# Patient Record
Sex: Female | Born: 1935 | Race: White | Hispanic: No | Marital: Married | Smoking: Never smoker
Health system: Southern US, Community
[De-identification: ages and names within clinical notes are randomized; demographics above are authoritative.]

## PROBLEM LIST (undated history)

## (undated) DIAGNOSIS — I10 Essential (primary) hypertension: Secondary | ICD-10-CM

## (undated) DIAGNOSIS — K219 Gastro-esophageal reflux disease without esophagitis: Secondary | ICD-10-CM

## (undated) DIAGNOSIS — M1990 Unspecified osteoarthritis, unspecified site: Secondary | ICD-10-CM

## (undated) DIAGNOSIS — Z9889 Other specified postprocedural states: Secondary | ICD-10-CM

## (undated) DIAGNOSIS — I719 Aortic aneurysm of unspecified site, without rupture: Secondary | ICD-10-CM

## (undated) DIAGNOSIS — R002 Palpitations: Secondary | ICD-10-CM

## (undated) DIAGNOSIS — R112 Nausea with vomiting, unspecified: Secondary | ICD-10-CM

## (undated) DIAGNOSIS — J189 Pneumonia, unspecified organism: Secondary | ICD-10-CM

## (undated) DIAGNOSIS — R55 Syncope and collapse: Secondary | ICD-10-CM

## (undated) DIAGNOSIS — E785 Hyperlipidemia, unspecified: Secondary | ICD-10-CM

## (undated) DIAGNOSIS — R42 Dizziness and giddiness: Secondary | ICD-10-CM

## (undated) DIAGNOSIS — Z8489 Family history of other specified conditions: Secondary | ICD-10-CM

## (undated) DIAGNOSIS — T7840XA Allergy, unspecified, initial encounter: Secondary | ICD-10-CM

## (undated) DIAGNOSIS — D126 Benign neoplasm of colon, unspecified: Secondary | ICD-10-CM

## (undated) DIAGNOSIS — Z9289 Personal history of other medical treatment: Secondary | ICD-10-CM

## (undated) HISTORY — DX: Gastro-esophageal reflux disease without esophagitis: K21.9

## (undated) HISTORY — PX: CERVICAL CONE BIOPSY: SUR198

## (undated) HISTORY — PX: PANENDOSCOPY: SHX2159

## (undated) HISTORY — DX: Syncope and collapse: R55

## (undated) HISTORY — DX: Allergy, unspecified, initial encounter: T78.40XA

## (undated) HISTORY — DX: Hyperlipidemia, unspecified: E78.5

## (undated) HISTORY — DX: Essential (primary) hypertension: I10

## (undated) HISTORY — PX: COLONOSCOPY: SHX174

## (undated) HISTORY — PX: PAROTIDECTOMY: SUR1003

## (undated) HISTORY — DX: Palpitations: R00.2

## (undated) HISTORY — DX: Benign neoplasm of colon, unspecified: D12.6

## (undated) HISTORY — DX: Aortic aneurysm of unspecified site, without rupture: I71.9

---
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
## 1970-11-02 HISTORY — PX: TUBAL LIGATION: SHX77

## 1998-12-20 ENCOUNTER — Other Ambulatory Visit: Admission: RE | Admit: 1998-12-20 | Discharge: 1998-12-20 | Payer: Self-pay | Admitting: Family Medicine

## 2005-12-30 ENCOUNTER — Encounter: Admission: RE | Admit: 2005-12-30 | Discharge: 2005-12-30 | Payer: Self-pay | Admitting: Family Medicine

## 2008-01-09 HISTORY — PX: CARDIOVASCULAR STRESS TEST: SHX262

## 2009-06-02 DIAGNOSIS — J189 Pneumonia, unspecified organism: Secondary | ICD-10-CM

## 2009-06-02 HISTORY — DX: Pneumonia, unspecified organism: J18.9

## 2009-06-25 ENCOUNTER — Ambulatory Visit: Payer: Self-pay | Admitting: Diagnostic Radiology

## 2009-06-25 ENCOUNTER — Encounter: Payer: Self-pay | Admitting: Emergency Medicine

## 2009-06-26 ENCOUNTER — Inpatient Hospital Stay (HOSPITAL_COMMUNITY): Admission: EM | Admit: 2009-06-26 | Discharge: 2009-06-28 | Payer: Self-pay | Admitting: Internal Medicine

## 2009-12-05 ENCOUNTER — Encounter: Admission: RE | Admit: 2009-12-05 | Discharge: 2009-12-05 | Payer: Self-pay | Admitting: Gastroenterology

## 2009-12-17 ENCOUNTER — Encounter: Admission: RE | Admit: 2009-12-17 | Discharge: 2009-12-17 | Payer: Self-pay | Admitting: Gastroenterology

## 2009-12-30 ENCOUNTER — Encounter (INDEPENDENT_AMBULATORY_CARE_PROVIDER_SITE_OTHER): Payer: Self-pay | Admitting: *Deleted

## 2010-02-05 ENCOUNTER — Ambulatory Visit: Payer: Self-pay | Admitting: Gastroenterology

## 2010-02-05 ENCOUNTER — Telehealth (INDEPENDENT_AMBULATORY_CARE_PROVIDER_SITE_OTHER): Payer: Self-pay | Admitting: *Deleted

## 2010-03-02 DIAGNOSIS — D126 Benign neoplasm of colon, unspecified: Secondary | ICD-10-CM

## 2010-03-02 HISTORY — DX: Benign neoplasm of colon, unspecified: D12.6

## 2010-03-14 ENCOUNTER — Encounter (INDEPENDENT_AMBULATORY_CARE_PROVIDER_SITE_OTHER): Payer: Self-pay

## 2010-03-18 ENCOUNTER — Telehealth: Payer: Self-pay | Admitting: Gastroenterology

## 2010-03-18 ENCOUNTER — Ambulatory Visit: Payer: Self-pay | Admitting: Gastroenterology

## 2010-03-25 ENCOUNTER — Ambulatory Visit: Payer: Self-pay | Admitting: Gastroenterology

## 2010-03-27 ENCOUNTER — Encounter: Payer: Self-pay | Admitting: Gastroenterology

## 2010-05-09 ENCOUNTER — Telehealth: Payer: Self-pay | Admitting: Gastroenterology

## 2010-12-02 NOTE — Letter (Signed)
Summary: [facility] Instructions  [facility]  [address] [place], [place] [address]   Phone: [phone]  Fax: [phone]       Karla Chavez    11-Sep-1935    MRN: [id number]        Procedure Day /Date:  Tuesday 03/25/2010     Arrival Time: 8:30 am      Procedure Time: 9:30 am     Location of Procedure:                    _x _  [facility] (4th Floor)                        PREPARATION FOR COLONOSCOPY/ ENDO  WITH MOVIPREP   Starting 5 days prior to your procedure Thursday 5/19 do not eat nuts, seeds, popcorn, corn, beans, peas,  salads, or any raw vegetables.  Do not take any fiber supplements (e.g. Metamucil, Citrucel, and Benefiber).  THE DAY BEFORE YOUR PROCEDURE         DATE: Monday 5/23  1.  Drink clear liquids the entire day-NO SOLID FOOD  2.  Do not drink anything colored red or purple.  Avoid juices with pulp.  No orange juice.  3.  Drink at least 64 oz. (8 glasses) of fluid/clear liquids during the day to prevent dehydration and help the prep work efficiently.  CLEAR LIQUIDS INCLUDE: Water Jello Ice Popsicles Tea (sugar ok, no milk/cream) Powdered fruit flavored drinks Coffee (sugar ok, no milk/cream) Gatorade Juice: apple, white grape, white cranberry  Lemonade Clear bullion, consomm, broth Carbonated beverages (any kind) Strained chicken noodle soup Hard Candy                             4.  In the morning, mix first dose of MoviPrep solution:    Empty 1 Pouch A and 1 Pouch B into the disposable container    Add lukewarm drinking water to the top line of the container. Mix to dissolve    Refrigerate (mixed solution should be used within 24 hrs)  5.  Begin drinking the prep at 5:00 p.m. The MoviPrep container is divided by 4 marks.   Every 15 minutes drink the solution down to the next mark (approximately 8 oz) until the full liter is complete.   6.  Follow completed prep with 16 oz of clear liquid of your choice  (Nothing red or purple).  Continue to drink clear liquids until bedtime.  7.  Before going to bed, mix second dose of MoviPrep solution:    Empty 1 Pouch A and 1 Pouch B into the disposable container    Add lukewarm drinking water to the top line of the container. Mix to dissolve    Refrigerate  THE DAY OF YOUR PROCEDURE      DATE: Tuesday 5/24  Beginning at 4:30 a.m. (5 hours before procedure):         1. Every 15 minutes, drink the solution down to the next mark (approx 8 oz) until the full liter is complete.  2. Follow completed prep with 16 oz. of clear liquid of your choice.    3. You may drink clear liquids until 7:30 am  (2 HOURS BEFORE PROCEDURE).   MEDICATION INSTRUCTIONS  Unless otherwise instructed, you should take regular prescription medications with a small sip of water   as early as possible  the morning of your procedure.         OTHER INSTRUCTIONS  You will need a responsible adult at least 74 years of age to accompany you and drive you home.   This person must remain in the waiting room during your procedure.  Wear loose fitting clothing that is easily removed.  Leave jewelry and other valuables at home.  However, you may wish to bring a book to read or  an iPod/MP3 player to listen to music as you wait for your procedure to start.  Remove all body piercing jewelry and leave at home.  Total time from sign-in until discharge is approximately 2-3 hours.  You should go home directly after your procedure and rest.  You can resume normal activities the  day after your procedure.  The day of your procedure you should not:   Drive   Make legal decisions   Operate machinery   Drink alcohol   Return to work  You will receive specific instructions about eating, activities and medications before you leave.    The above instructions have been reviewed and explained to me by   Ulis Rias RN  Mar 18, 2010 10:57 AM     I fully understand and can  verbalize these instructions _____________________________ Date _________

## 2010-12-02 NOTE — Progress Notes (Signed)
Summary: pt changed from colon to colon/egd  ---- Converted from flag ---- ---- 02/05/2010 3:23 PM, Meryl Dare MD [facility] wrote: She can schedule her colon recall with me and then she can see me for heartburn in the office. However, if she has not had an EGD within the last few years by Dr. Matthias Hughs she can schedule a colon/egd directly with me if she would like. MS  ---- 02/05/2010 2:20 PM, Ezra Sites RN wrote: Dr. Russella Dar, I saw Adelina Mings in [facility] today for recall colon.  Last colon was with you in 2002.  She is also currently being seen by Dr. Matthias Hughs at [facility] for heartburn symptoms.  She did not make the connection that she was seeing 2 gastroenterologists.  She prefers to see you instead of Dr. Matthias Hughs. How do you want me to handle this? ------------------------------  Appended Document: pt changed from colon to colon/egd Pt scheduled for colon/egd 5/24 with Dr. Russella Dar.  PV scheduled for 5/10

## 2010-12-02 NOTE — Progress Notes (Signed)
Summary: Medication  Phone Note Call from Patient Call back at Home Phone [phone]   Caller: Patient Call For: Dr. Russella Dar Reason for Call: Talk to Nurse Summary of Call: The rx Dexilant is too expensive...Marland Kitchenneeds an alternative med. Initial call taken by: Karna Christmas,  May 09, 2010 11:38 AM  Follow-up for Phone Call        left message for pt  to call back with husband Follow-up by: Christie Nottingham CMA Duncan Dull),  May 09, 2010 1:42 PM  Additional Follow-up for Phone Call Additional follow up Details #1::        Pt states her medication is too expensive and would like to take omeprazole instead of Dexilant b/c her insurance will cover this at 100% withtout any problems. Rx was sent to pts pharmacy.  Additional Follow-up by: Christie Nottingham CMA Duncan Dull),  May 14, 2010 3:42 PM    New/Updated Medications: OMEPRAZOLE 40 MG CPDR (OMEPRAZOLE) one tablet by mouth once daily Prescriptions: OMEPRAZOLE 40 MG CPDR (OMEPRAZOLE) one tablet by mouth once daily  #30 x 11   Entered by:   Christie Nottingham CMA (AAMA)   Authorized by:   Meryl Dare MD [facility]   Signed by:   Christie Nottingham CMA (AAMA) on 05/14/2010   Method used:   [facility]  [place] [address] [facility]       [address]       [place], [place]  [address]       Ph: [phone] or [phone]       Fax: [phone]   RxID:   [id number]

## 2010-12-02 NOTE — Progress Notes (Signed)
Summary: Prep Medication  Phone Note Call from Patient Call back at Work Phone [phone]   Caller: Patient Call For: Dr. Russella Dar Reason for Call: Talk to Nurse Summary of Call: Moviprep is too expensive...Marland Kitchenneeds an alternative Initial call taken by: Leanor Kail [facility],  Mar 18, 2010 1:17 PM  Follow-up for Phone Call        Rebate coupon for Movi Prep mailed to pt. Follow-up by: Wyona Almas RN,  Mar 18, 2010 2:07 PM

## 2010-12-02 NOTE — Procedures (Signed)
Summary: Upper Endoscopy  Patient: [name] Note: All result statuses are Final unless otherwise noted.  Tests: (1) Upper Endoscopy (EGD)   EGD Upper Endoscopy       DONE     [facility]     [address]     [place], [place]  [address]           ENDOSCOPY PROCEDURE REPORT           PATIENT:  Karla, Chavez  MR#:  [id number]     BIRTHDATE:  May 31, 1935, 74 yrs. old  GENDER:  female     ENDOSCOPIST:  Judie Petit T. Russella Dar, MD, [facility]           PROCEDURE DATE:  03/25/2010     PROCEDURE:  EGD, diagnostic     ASA CLASS:  Class II     INDICATIONS:  GERD     MEDICATIONS:  There was residual sedation effect present from     prior procedure.     TOPICAL ANESTHETIC:  Exactacain Spray     DESCRIPTION OF PROCEDURE:   After the risks benefits and     alternatives of the procedure were thoroughly explained, informed     consent was obtained.  The LB GIF-H180 [id number] endoscope was     introduced through the mouth and advanced to the second portion of     the duodenum, without limitations.  The instrument was slowly     withdrawn as the mucosa was fully examined.     <<PROCEDUREIMAGES>>     The esophagus and gastroesophageal junction were completely normal     in appearance.  The stomach was entered and closely examined. The     pylorus, antrum, angularis, and lesser curvature were well     visualized, including a retroflexed view of the cardia and fundus.     The stomach wall was normally distensable. The scope passed easily     through the pylorus into the duodenum. The duodenal bulb was     normal in appearance, as was the postbulbar duodenum.  Retroflexed     views revealed no abnormalities.  The scope was then withdrawn     from the patient and the procedure completed.     COMPLICATIONS:  None           ENDOSCOPIC IMPRESSION:     1) Normal EGD           RECOMMENDATIONS:     1) Anti-reflux regimen     2) PPI qam: Dexilant 60 mg po qam, #30, 11 refills     3) DC  omeprazole           Tashona Calk T. Russella Dar, MD, [facility]           CC:  Kathee Delton, PA           n.     Rosalie DoctorVenita Lick. Jonella Redditt at 03/25/2010 10:17 AM           Adelina Mings, [id number]  Note: An exclamation mark (!) indicates a result that was not dispersed into the flowsheet. Document Creation Date: 03/25/2010 10:18 AM _______________________________________________________________________  (1) Order result status: Final Collection or observation date-time: 03/25/2010 10:12 Requested date-time:  Receipt date-time:  Reported date-time:  Referring Physician:   Ordering Physician: Claudette Head [id number]) Specimen Source:  Source: Launa Grill Order Number: [id number] Lab site:

## 2010-12-02 NOTE — Procedures (Signed)
Summary: Colonoscopy  Patient: Karla Chavez Note: All result statuses are Final unless otherwise noted.  Tests: (1) Colonoscopy (COL)   COL Colonoscopy           DONE     [facility]     [address]     [place], [place]  [address]           COLONOSCOPY PROCEDURE REPORT           PATIENT:  Karla Chavez, Karla Chavez  MR#:  [id number]     BIRTHDATE:  Dec 08, 1934, 74 yrs. old  GENDER:  female     ENDOSCOPIST:  Judie Petit T. Russella Dar, MD, [facility]           PROCEDURE DATE:  03/25/2010     PROCEDURE:  Colonoscopy with biopsy     ASA CLASS:  Class II     INDICATIONS:  1) Routine Risk Screening     MEDICATIONS:   Fentanyl 100 mcg IV, Versed 10 mg IV     DESCRIPTION OF PROCEDURE:   After the risks benefits and     alternatives of the procedure were thoroughly explained, informed     consent was obtained.  Digital rectal exam was performed and     revealed no abnormalities.   The LB PCF-H180AL [id number] endoscope     was introduced through the anus and advanced to the cecum, which     was identified by both the appendix and ileocecal valve, without     limitations.  The quality of the prep was excellent, using     MoviPrep.  The instrument was then slowly withdrawn as the colon     was fully examined.     <<PROCEDUREIMAGES>>     FINDINGS:  A sessile polyp was found in the cecum. It was 4 mm in     size. The polyp was removed using cold biopsy forceps.  A sessile     polyp was found at the hepatic flexure. It was 5 mm in size. The     polyp was removed using cold biopsy forceps.  Two polyps were     found in the sigmoid colon. They were 4 mm in size. The polyps     were removed using cold biopsy forceps.  A normal appearing     ileocecal valve, and appendiceal orifice were identified. The     ascending, hepatic flexure, transverse, splenic flexure,     descending, and rectum appeared unremarkable. Retroflexed views in     the rectum revealed internal hemorrhoids, small. The time to cecum     =   2  minutes. The scope was then withdrawn (time =  9.67  min)     from the patient and the procedure completed.     COMPLICATIONS:  None     ENDOSCOPIC IMPRESSION:     1) 4 mm sessile polyp in the cecum     2) 5 mm sessile polyp at the hepatic flexure     3) 4 mm Two polyps in the sigmoid colon     4) Internal hemorrhoids     RECOMMENDATIONS:     1) Await pathology results     2) If the polyps removed today are adenomatous (pre-cancerous),     you will need a repeat colonoscopy in 5 years. Otherwise you     should continue to follow colorectal cancer screening guidelines     for "routine risk" patients with colonoscopy in 10 years.  Venita Lick. Russella Dar, MD, [facility]           CC: Kathee Delton, PA           n.     Rosalie DoctorVenita Lick. Kristl Morioka at 03/25/2010 10:09 AM           Adelina Mings, [id number]  Note: An exclamation mark (!) indicates a result that was not dispersed into the flowsheet. Document Creation Date: 03/25/2010 10:10 AM _______________________________________________________________________  (1) Order result status: Final Collection or observation date-time: 03/25/2010 10:03 Requested date-time:  Receipt date-time:  Reported date-time:  Referring Physician:   Ordering Physician: Claudette Head [phone]) Specimen Source:  Source: Launa Grill Order Number: [id number] Lab site:   Appended Document: Colonoscopy     Procedures Next Due Date:    Colonoscopy: 03/2015

## 2010-12-02 NOTE — Miscellaneous (Signed)
Summary: Lec previsit  Clinical Lists Changes  Medications: Added new medication of MOVIPREP 100 GM  SOLR (PEG-KCL-NACL-NASULF-NA ASC-C) As per prep instructions. - Signed Rx of MOVIPREP 100 GM  SOLR (PEG-KCL-NACL-NASULF-NA ASC-C) As per prep instructions.;  #1 x 0;  Signed;  Entered by: Ulis Rias RN;  Authorized by: Meryl Dare MD [facility];  Method used: [facility]  [place] [address] [place], [place], [place]  [address], Ph: [phone] or [phone], Fax: [phone] Observations: Added new observation of NKA: T (03/18/2010 9:53)    Prescriptions: MOVIPREP 100 GM  SOLR (PEG-KCL-NACL-NASULF-NA ASC-C) As per prep instructions.  #1 x 0   Entered by:   Ulis Rias RN   Authorized by:   Meryl Dare MD [facility]   Signed by:   Ulis Rias RN on 03/18/2010   Method used:   [facility]        [facility]  [place] [address] [facility]ail[facility]       [address]       [place], [place]  [address]       Ph: [phone] or [phone]       Fax: [phone]   RxID:   [id number]

## 2010-12-02 NOTE — Miscellaneous (Signed)
Summary: dexilant order  Clinical Lists Changes  Medications: Added new medication of DEXILANT 60 MG CPDR (DEXLANSOPRAZOLE) one p.o. every am - Signed Rx of DEXILANT 60 MG CPDR (DEXLANSOPRAZOLE) one p.o. every am;  #30 x 11;  Signed;  Entered by: Alease Frame RN;  Authorized by: Meryl Dare MD [facility];  Method used: [facility]  [place] [address] [place], [place], [place]  [address], Ph: [phone] or [phone], Fax: [phone] Observations: Added new observation of ALLERGY REV: Done (03/25/2010 10:26) Added new observation of NKA: T (03/25/2010 10:26)    Prescriptions: DEXILANT 60 MG CPDR (DEXLANSOPRAZOLE) one p.o. every am  #30 x 11   Entered by:   Alease Frame RN   Authorized by:   Meryl Dare MD [facility]   Signed by:   Alease Frame RN on 03/25/2010   Method used:   [facility]        [facility]  [place] [address] (retail)       [address]       [place], [place]  [address]       Ph: [phone] or [phone]       Fax: [phone]   RxID:   [id number]

## 2010-12-02 NOTE — Letter (Signed)
Summary: Patient Notice- Polyp Results  [facility]  [address] [place], [place] [address]   Phone: [phone]  Fax: [phone]        Mar 27, 2010 MRN: [id number]    Karla Chavez [address] [place], [place]  [address]    Dear Ms. Mira,  I am pleased to inform you that the colon polyp(s) removed during your recent colonoscopy was (were) found to be benign (no cancer detected) upon pathologic examination.  I recommend you have a repeat colonoscopy examination in 5 years to look for recurrent polyps, as having colon polyps increases your risk for having recurrent polyps or even colon cancer in the future.  Should you develop new or worsening symptoms of abdominal pain, bowel habit changes or bleeding from the rectum or bowels, please schedule an evaluation with either your primary care physician or with me.  Continue treatment plan as outlined the day of your exam.  Please call us if you are having persistent problems or have questions about your condition that have not been fully answered at this time.  Sincerely,  Meryl Dare MD [facility]  This letter has been electronically signed by your physician.  Appended Document: Patient Notice- Polyp Results letter mailed.

## 2010-12-02 NOTE — Letter (Signed)
Summary: Previsit letter  [facility]  [address] [place], [place] [address]   Phone: [phone]  Fax: [phone]       12/30/2009 MRN: [id number]  Karla Chavez [address] [place], [place]  [address]  Dear Ms. Geeting,  Welcome to the Gastroenterology Division at [facility].    You are scheduled to see a nurse for your pre-procedure visit on 02-05-10 at 1:30pm on the 3rd floor at [facility], [address]  We ask that you try to arrive at our office 15 minutes prior to your appointment time to allow for check-in.  Your nurse visit will consist of discussing your medical and surgical history, your immediate family medical history, and your medications.    Please bring a complete list of all your medications or, if you prefer, bring the medication bottles and we will list them.  We will need to be aware of both prescribed and over the counter drugs.  We will need to know exact dosage information as well.  If you are on blood thinners (Coumadin, Plavix, Aggrenox, Ticlid, etc.) please call our office today/prior to your appointment, as we need to consult with your physician about holding your medication.   Please be prepared to read and sign documents such as consent forms, a financial agreement, and acknowledgement forms.  If necessary, and with your consent, a friend or relative is welcome to sit-in on the nurse visit with you.  Please bring your insurance card so that we may make a copy of it.  If your insurance requires a referral to see a specialist, please bring your referral form from your primary care physician.  No co-pay is required for this nurse visit.     If you cannot keep your appointment, please call [phone] to cancel or reschedule prior to your appointment date.  This allows [place] the opportunity to schedule an appointment for another patient in need of care.    Thank you for choosing [facility] for your medical  needs.  We appreciate the opportunity to care for you.  Please visit [place] at our website  to learn more about our practice.                     Sincerely.                                                                                                                   The Gastroenterology Division

## 2011-02-07 LAB — BASIC METABOLIC PANEL
BUN: 15 mg/dL (ref 6–23)
CO2: 25 mEq/L (ref 19–32)
Chloride: 102 mEq/L (ref 96–112)
GFR calc Af Amer: >60 mL/min (ref >60)
Glucose, Bld: 147 mg/dL — ABNORMAL HIGH (ref 70–99)
Sodium: 137 mEq/L (ref 135–145)

## 2011-02-07 LAB — URINALYSIS, ROUTINE W REFLEX MICROSCOPIC
Bilirubin Urine: NEGATIVE
Glucose, UA: NEGATIVE mg/dL
Glucose, UA: NEGATIVE mg/dL
Ketones, ur: NEGATIVE mg/dL
Leukocytes, UA: NEGATIVE
Nitrite: NEGATIVE
Nitrite: NEGATIVE
Protein, ur: NEGATIVE mg/dL
Specific Gravity, Urine: 1.005 (ref 1.005–1.030)
Specific Gravity, Urine: 1.018 (ref 1.005–1.030)
pH: 6 (ref 5.0–8.0)
pH: 7 (ref 5.0–8.0)

## 2011-02-07 LAB — CULTURE, BLOOD (ROUTINE X 2): Culture: NO GROWTH

## 2011-02-07 LAB — COMPREHENSIVE METABOLIC PANEL
ALT: 57 U/L — ABNORMAL HIGH (ref 0–35)
AST: 41 U/L — ABNORMAL HIGH (ref 0–37)
Albumin: 2.8 g/dL — ABNORMAL LOW (ref 3.5–5.2)
Alkaline Phosphatase: 123 U/L — ABNORMAL HIGH (ref 39–117)
CO2: 24 mEq/L (ref 19–32)
Calcium: 8.3 mg/dL — ABNORMAL LOW (ref 8.4–10.5)
Chloride: 106 mEq/L (ref 96–112)
Creatinine, Ser: 0.68 mg/dL (ref 0.4–1.2)
GFR calc Af Amer: >60 mL/min (ref >60)
Sodium: 138 mEq/L (ref 135–145)

## 2011-02-07 LAB — DIFFERENTIAL
Basophils Relative: 0 % (ref 0–1)
Eosinophils Absolute: 0.1 10*3/uL (ref 0.0–0.7)
Eosinophils Relative: 1 % (ref 0–5)
Lymphs Abs: 0.6 10*3/uL — ABNORMAL LOW (ref 0.7–4.0)
Monocytes Absolute: 0.5 10*3/uL (ref 0.1–1.0)
Monocytes Relative: 5 % (ref 3–12)
Monocytes Relative: 7 % (ref 3–12)
Neutro Abs: 5.9 10*3/uL (ref 1.7–7.7)
Neutro Abs: 6.9 10*3/uL (ref 1.7–7.7)
Neutrophils Relative %: 86 % — ABNORMAL HIGH (ref 43–77)

## 2011-02-07 LAB — CBC
HCT: 34.3 % — ABNORMAL LOW (ref 36.0–46.0)
HCT: 35.6 % — ABNORMAL LOW (ref 36.0–46.0)
MCHC: 34.8 g/dL (ref 30.0–36.0)
MCV: 92.3 fL (ref 78.0–100.0)
MCV: 93.1 fL (ref 78.0–100.0)
Platelets: 186 10*3/uL (ref 150–400)
Platelets: 204 10*3/uL (ref 150–400)
RBC: 3.69 MIL/uL — ABNORMAL LOW (ref 3.87–5.11)
RBC: 3.86 MIL/uL — ABNORMAL LOW (ref 3.87–5.11)
RDW: 12.6 % (ref 11.5–15.5)

## 2011-02-07 LAB — URINE MICROSCOPIC-ADD ON

## 2011-02-07 LAB — POCT CARDIAC MARKERS: Troponin i, poc: <0.05 ng/mL (ref 0.00–0.09)

## 2011-02-07 LAB — HEMOGLOBIN A1C: Mean Plasma Glucose: 120 mg/dL

## 2011-02-07 LAB — TSH: TSH: 2.79 u[IU]/mL (ref 0.350–4.500)

## 2011-03-17 NOTE — H&P (Signed)
NAMETYWANDA, RICE             ACCOUNT NO.:  [id number]   MEDICAL RECORD NO.:  [id number]          PATIENT TYPE:  INP   LOCATION:  5010                         FACILITY:  [facility]   PHYSICIAN:  Oswald Hillock, MD        DATE OF BIRTH:  19-Oct-1935   DATE OF ADMISSION:  06/26/2009  DATE OF DISCHARGE:                              HISTORY & PHYSICAL   CHIEF COMPLAINT:  Fever.   HISTORY OF PRESENT ILLNESS:  The patient is a 76 year old Caucasian  female with a known history of hypertension and hyperlipidemia in  addition to GERD who presented to the emergency room at [facility] with  3-5 day history of persistent fever.  Apparently, the patient was seen  by her primary care physician and started on amoxicillin, but had no  relief in her symptoms.  Significantly no diagnosis was established for  treatment with antibiotics at that time.   EMERGENCY ROOM EVALUATION:  The patient had an initial evaluation that  included chest x-ray, which showed a left lower lobe pneumonia.  She  also had an elevated D-dimer after which a CTA was done in the ER that  confirmed the presence of a pneumonia.   PAST MEDICAL HISTORY:  1. Hypertension.  2. Hyperlipidemia.  3. GERD.   PAST SURGICAL HISTORY:  History of hysterectomy/oophorectomy status post  complications after delivery.   CURRENT MEDICATIONS:  1. Amlodipine.  2. Amoxicillin.  3. Benazepril.  4. Omeprazole.  5. Simvastatin.   ALLERGIES:  No known drug allergies.   SOCIAL HISTORY:  No history of alcohol, tobacco, or drug use.  The  patient lives with her husband.   FAMILY HISTORY:  History of coronary artery disease in most of her  siblings.   REVIEW OF SYSTEMS:  An extensive review of systems was done.  All  systems are negative except for the positive mentioned in the history of  present illness.   PHYSICAL EXAMINATION:  VITALS:  On admission, pulse 100, blood pressure  105/71, respiratory rate of 24, temperature of 103.2, O2 sats  97%.  GENERAL:  Frail, elderly Caucasian female in no acute distress at the  time of this interview.  Alert and oriented to time, place, and person.  HEENT:  No scleral icterus.  No conjunctival congestion.  No pallor.  Ears negative.  No significant oropharyngeal lesions.  NECK:  Supple.  No lymphadenopathy.  No JVD.  No carotid bruit.  CHEST:  Breath sounds heard bilaterally.  Fair air entry.  Bronchial  breathing with crackles at the left base extending to lower one-third of  the chest.  CV:  S1 and S2 plus, regular.  No gallop or rub.  ABDOMEN:  Soft, nontender.  Bowel sounds present.  EXTREMITIES:  No cyanosis, clubbing, or edema.  NEUROLOGIC:  Cranial nerves II through XII are grossly intact.  No focal  motor or sensory deficits noted on gross examination.   LABORATORY DATA:  WBC count 7.9, hemoglobin 12.4, hematocrit 35.6,  platelet count of 204.  Urinalysis showed 0-2 wbc's, 7-10 rbc's, rare  bacteria.  Negative nitrite.  Negative leuk esterase.  Her chest x-ray  showed left lower lobe pneumonia.  Lactic acid level was 1.6.  D-dimer  was elevated at 2.04.  Strep screen was negative.  CK was 113, MB less  than 1, troponin less than 0.05.  Her CT showed no evidence of acute  pulmonary embolism and left lower lobe pneumonia.   EKG showed normal sinus rhythm, rate of 82.  No other significant  abnormalities noted.   IMPRESSION AND PLAN:  This is a case of a 76 year old Caucasian female  with history of hypertension, hyperlipidemia, gastroesophageal reflux  disease who presents with fever.  1. Left lower lobe pneumonia, need to rule out aspiration as the      patient does give a history of having some difficulty swallowing,      especially when she takes her medications.  She describes it as a      feeling of things getting stuck in the back of her throat.  We will      discontinue Rocephin and Zithromax, start her on Avelox, and      schedule her for swallow study in the  morning.  The patient will be      monitored closely.  2. Hypertension, controlled.  Continue current medications.  3. Gastroesophageal reflux disease.  We will continue omeprazole.  4. Hyperlipidemia.  Continue simvastatin.  Check fasting lipid panel.  5. DVT/GI prophylaxis.  Omeprazole and subcu heparin.      Oswald Hillock, MD  Electronically Signed     BA/MEDQ  D:  06/26/2009  T:  06/26/2009  Job:  [id number]   cc:   [facility]

## 2011-03-20 NOTE — Discharge Summary (Signed)
NAMEARMEDA, PLUMB             ACCOUNT NO.:  [id number]   MEDICAL RECORD NO.:  [id number]          PATIENT TYPE:  INP   LOCATION:  5010                         FACILITY:  [facility]   PHYSICIAN:  Hillery Aldo, M.D.   DATE OF BIRTH:  [birth year]   DATE OF ADMISSION:  06/26/2009  DATE OF DISCHARGE:  06/28/2009                               DISCHARGE SUMMARY   PRIMARY CARE PHYSICIAN:  [facility].   DISCHARGE DIAGNOSES:  1. Community-acquired pneumonia.  2. Hypertension.  3. Dyslipidemia.  4. Gastroesophageal reflux disease.  5. Transient presyncope.  6. Hypokalemia.   DISCHARGE MEDICATIONS:  1. Avelox 400 mg p.o. daily (note, this was changed secondary to cost      constraints).  2. Ceftin 500 mg p.o. daily x5 more days.  3. Amlodipine 5 mg p.o. daily.  4. Lotensin 10 mg p.o. daily.  5. Omeprazole 20 mg p.o. daily.  6. Simvastatin 80 mg p.o. q.p.m.   CONSULTATIONS:  None.   BRIEF ADMISSION HISTORY OF PRESENT ILLNESS:  The patient is a 76-year-  old female who presented to the hospital with a chief complaint of  persistent fever.  She was seen by her primary care physician as an  outpatient and initially put on amoxicillin, but had no relief of her  symptoms and subsequently presented to the emergency department for  further evaluation at which time a chest x-ray showed a new left lower  lobe pneumonia.  She subsequently was referred to the Hospitalist  Service for further evaluation and treatment.  For the full details,  please see the dictated report done by Dr. Ninfa Linden.   PROCEDURES AND DIAGNOSTIC STUDIES:  1. Chest x-ray on June 25, 2009, showed left lower lobe pneumonia.  2. CT angiogram of the chest on June 25, 2009, showed no evidence of      acute pulmonary embolism.  Left lower lobe pneumonia.   DISCHARGE LABORATORY DATA:  Blood cultures were negative.  Hemoglobin  A1c was 5.8%.  TSH was 2.790.  Sodium was 138, potassium 3.3 (repleted),  chloride 106, bicarb 24, BUN 6, creatinine 0.68, glucose 108, total  bilirubin 0.7, alkaline phosphatase 123, AST 41, ALT 57, total protein  5.9, albumin 2.8, calcium 8.3.  White blood cell count was 7.1,  hemoglobin 11.9, hematocrit 34.3, platelets 186.   HOSPITAL COURSE:  1. Pneumonia:  The patient was admitted and put on IV Avelox.  Over      the course of her hospital stay, the patient's fever resolved and      she felt better and by the date of discharge, felt much better and      was requesting discharge.  Because of the location of the      pneumonia, a speech therapy consult was requested to rule out      aspiration.  There was no evidence of aspiration.  She was      discharged initially on Avelox, but the pharmacy called requesting      an alternative antibiotic due to this being cost prohibitive and      was subsequently recommended that the  patient take an additional 5      days of therapy with Ceftin 500 mg p.o. b.i.d.  2. Hypertension:  The patient's blood pressure was stable throughout      her hospital stay.  3. Dyslipidemia:  The patient did have a fasting lipid panel checked      while in the hospital, which showed good LDL control at 98 and      total cholesterol of 144.  4. Gastroesophageal reflux disease:  Stable on a proton-pump      inhibitor.  5. Transient presyncope:  The patient complained of feeling hot and      heavy headed on the date of discharge.  She was monitored closely      for the next several hours.  She did not have any evidence of      orthostasis and ultimately was discharged when she felt better.   DISPOSITION:  The patient was discharged home.   CONDITION AT DISCHARGE:  Improved.   Time spent coordinating care for discharge and discharge instructions  equal 25 minutes.      Hillery Aldo, M.D.  Electronically Signed     CR/MEDQ  D:  07/01/2009  T:  07/02/2009  Job:  [id number]   cc:   [facility]

## 2011-03-20 NOTE — Discharge Summary (Signed)
NAMEJACQUELYN, SHADRICK             ACCOUNT NO.:  [id number]   MEDICAL RECORD NO.:  [id number]          PATIENT TYPE:  INP   LOCATION:  5010                         FACILITY:  [facility]   PHYSICIAN:  Melissa L. Ladona Ridgel, MD  DATE OF BIRTH:  [birth year]   DATE OF ADMISSION:  06/26/2009  DATE OF DISCHARGE:  06/28/2009                               DISCHARGE SUMMARY   DISCHARGING DIAGNOSES:  1. Left lower lobe pneumonia occult in nature.  The patient presented      to the hospital with low-grade fevers which had subsequently gotten      worse just prior to admission. She had been seen in the outpatient      office and was partially treated with oral Augmentin upon her      arrival.  She was admitted to the general medical floor and was      noted to be having difficulty with some swallowing mostly of      tablets.  The patient was initially treated with Rocephin and      azithromycin and subsequently was switched to Avelox, which will      more likely cover organisms in the community.  At the time of      discharge she would be discharged to home on Avelox 400 mg p.o.      daily.  The patient was afebrile and much improved at the time of      her discharge.  2. Hypertension.  The patient has been maintained on her current      medications which were Norvasc and Cipro.  Low blood pressure had      never been an issue for her during the hospital stay.  3. Gastroesophageal reflux disease.  The patient has had difficulty      with this as an outpatient and was started on Protonix as an      inpatient. However, she noted to me on admission that she had      actually failed outpatient therapy with Protonix and has done the      best with omeprazole delayed release. I, therefore, restarted that      for her brief hospital stay and will continue that at the time of      discharge.  4. Hyperlipidemia.  The patient was continued on her simvastatin.  5. Microhematuria, really not incredibly significant  at 0-2 WBCs and      myoglobin was obtained and was within normal limits.  If her      primary care physician feels that it is appropriate for a      urological consultation as an outpatient this may be in order.      However, at this time I do not have these findings were significant      enough to warrant that invasive a procedure.   MEDICATIONS AT THE TIME OF DISCHARGE:  Amlodipine 5 mg p.o. daily,  benazepril 10 mg daily. Heparin was used in the hospital but will be  discontinued at the time of discharge. Avelox 400 mg p.o. daily,  omeprazole 20 mg daily, and simvastatin  80 mg at bedtime.   Please note that the patient was treated with Xopenex during the course  of her hospital stay. However, this was p.r.n. and did not feel that at  the time of discharge she would require beta-agonist as she is looking  quite well.   CONSULTATIONS:  The patient was seen and evaluated by speech therapy and  was noted to have no obvious aspiration issues.   HOSPITAL COURSE:  The patient is a 76 year old white female who  presented to the emergency room after multiple interactions with her  primary care physician.  The patient evidently had a 3- to 5-day history  of persistent fever with cough.  She saw her primary care physician and  was started on amoxicillin but persisted in having a significant fever  and therefore was brought to the emergency room for further care. In the  emergency room the patient was evaluated with a chest x-ray which showed  left lower lobe pneumonia.  She also was noted to have an elevated D-  dimer and underwent CT scanning in the emergency room setting which  confirmed the presence of pneumonia.   The patient was admitted to the general medical floor, continued on IV  Avelox.  She underwent speech therapy evaluation and by 24 hours after  initiation of IV antibiotics was up, around, and afebrile.   PHYSICAL EXAMINATION:  GENERAL: The patient looked like a markedly   improved new person at that time. She had no complaints of cough, rigor,  chest pain, or fever.  She was sitting at the bedside eating her meal.  VITAL SIGNS:  On the day prior to discharge the patient remained  afebrile.  We converted her to oral antibiotic therapy without any  evidence for recurrence of her fever and on the day of discharge was  noted to have a temperature of 97.7, blood pressure 149/76, pulse 71,  respirations 18, saturation 98%.  The patient was seen and examined by my partner, whose exam was  consistent with my previous exam showing no rhonchi, rales, wheezes.  HEENT:  Pupils are equal, round, and reactive to light.  She has  anicteric sclerae.  CARDIOVASCULAR: Regular rhythm, positive S1, S2.  No S3, S4, no murmurs,  rubs or gallops.  ABDOMEN:  Soft, nontender, nondistended with positive bowel sounds.  There was no hepatosplenomegaly and no guarding or rebound.  EXTREMITIES:  No edema.  NEUROLOGIC: She was awake and alert, oriented.  Cranial nerves II-XII  are intact.  Power is 5/5.  DTRs are 2+.  Plantars are downgoing.    Pertinent laboratories during the course the admission reveal urine  which was negative for any infection.  We did, however, show trace blood  in the UA.  Please note that the patient did express concern that in the  past she has had trace blood and has had significant workup as a younger  woman, including cystoscopy.    Blood cultures have been negative x2. Hemoglobin A1c was 5.8.  TSH was  2.790.  Her total cholesterol was 144, triglycerides 82 cholesterol 30,  LDL was 98.  Her last sodium was 138, potassium 3.3, the chloride 106,  CO2 was 24, glucose of 108, BUN was 6, creatinine was 0.68. Her D-dimer  was slightly elevated at 2.04 and her lactic acid was 1.6.   At this time the patient is deemed stable for discharge to home on oral  Avelox to complete a full course of therapy and follow  up as an  outpatient with her primary care  physician.  Her condition at discharge  is stable.  Disposition is to home.  Diet is cardiac prudent, low  cholesterol diet.      Melissa L. Ladona Ridgel, MD  Electronically Signed     MLT/MEDQ  D:  07/01/2009  T:  07/01/2009  Job:  [id number]   cc:   Evalee Jefferson

## 2011-05-20 ENCOUNTER — Telehealth: Payer: Self-pay | Admitting: Cardiology

## 2011-05-20 NOTE — Telephone Encounter (Signed)
Patient is taking BP medication and has been feeling bad lately; elevated BP recently. She says that she's been very dizzy. Patient wants to speak w/RN. She has an appointment on 05/27/11 with [place]

## 2011-05-20 NOTE — Telephone Encounter (Signed)
Called wanting an app. Has been having some heart fluttering and "just not feeling good". Front office made her an app for 7/25.

## 2011-05-25 ENCOUNTER — Other Ambulatory Visit: Payer: Self-pay | Admitting: Cardiology

## 2011-05-25 NOTE — Telephone Encounter (Signed)
escribe medication per fax request  

## 2011-05-26 ENCOUNTER — Encounter: Payer: Self-pay | Admitting: Cardiology

## 2011-05-27 ENCOUNTER — Ambulatory Visit (INDEPENDENT_AMBULATORY_CARE_PROVIDER_SITE_OTHER): Payer: Medicare Other | Admitting: Cardiology

## 2011-05-27 ENCOUNTER — Encounter: Payer: Self-pay | Admitting: Cardiology

## 2011-05-27 VITALS — BP 152/96 | HR 77 | Ht 67.0 in | Wt 137.8 lb

## 2011-05-27 DIAGNOSIS — I10 Essential (primary) hypertension: Secondary | ICD-10-CM

## 2011-05-27 DIAGNOSIS — E785 Hyperlipidemia, unspecified: Secondary | ICD-10-CM

## 2011-05-27 DIAGNOSIS — E7800 Pure hypercholesterolemia, unspecified: Secondary | ICD-10-CM

## 2011-05-27 DIAGNOSIS — R55 Syncope and collapse: Secondary | ICD-10-CM

## 2011-05-27 DIAGNOSIS — R5381 Other malaise: Secondary | ICD-10-CM

## 2011-05-27 DIAGNOSIS — R5383 Other fatigue: Secondary | ICD-10-CM

## 2011-05-27 DIAGNOSIS — R002 Palpitations: Secondary | ICD-10-CM

## 2011-05-27 MED ORDER — METOPROLOL TARTRATE 25 MG PO TABS: 25.0000 mg | ORAL_TABLET | Freq: Two times a day (BID) | ORAL | Status: DC

## 2011-05-27 NOTE — Progress Notes (Signed)
   Karla Chavez Date of Birth: Apr 30, 1935   History of Present Illness: Karla Chavez is seen for yearly followup of hypertension. She states she has not been feeling well. She has been under a lot of stress recently. She has been experiencing symptoms of her heart fluttering and feeling like she might pass out. At other times she feels that she is "out of the world". She began experiencing these palpitations over the past 6 months but they have become more frequent over the past month. At one time her blood pressure increased to 200/115. She does find that her symptoms are helped with diazepam. She has had no true syncope. She denies any chest pain or shortness of breath. She feels that something is not normal.  Current Outpatient Prescriptions on File Prior to Visit  Medication Sig Dispense Refill  . amLODipine (NORVASC) 5 MG tablet Take 5 mg by mouth as needed.       . benazepril (LOTENSIN) 20 MG tablet TAKE 1 TABLET EVERY DAY  90 tablet  3  . temazepam (RESTORIL) 15 MG capsule Take 15 mg by mouth at bedtime.        Marland Kitchen zolpidem (AMBIEN) 5 MG tablet Take 5 mg by mouth at bedtime as needed.         Allergies  Allergen Reactions  . Amoxicillin     Past Medical History  Diagnosis Date  . Hypertension   . Hyperlipidemia   . Indigestion   . GERD (gastroesophageal reflux disease)   . Palpitations   . Near syncope     Past Surgical History  Procedure Date  . Tubal ligation   . Panendoscopy   . Cardiovascular stress test 01/09/2008    EF 82%    History  Smoking status  . Never Smoker   Smokeless tobacco  . Not on file    History  Alcohol Use No    Family History  Problem Relation Age of Onset  . Hypertension Mother   . Stroke Mother   . Heart disease Father     Review of Systems: As noted in history of present illness. She admits that she often forgets to take her amlodipine in the evening. All other systems were reviewed and are negative.  Physical Exam: BP  152/96  Pulse 77  Ht 5\' 7"  (1.702 m)  Wt 137 lb 12.8 oz (62.506 kg)  BMI 21.58 kg/m2 She is a pleasant white female who appears anxious. She is normocephalic, atraumatic. Pupils are round and reactive to light and accommodation. Extraocular movements are full. Oropharynx is clear. Neck is supple without JVD, adenopathy, thyromegaly, or bruits. Lungs are clear. Cardiac exam reveals a regular rate and rhythm without gallop, murmur, or click. Abdomen is soft and nontender. She has no masses or bruits. Pedal pulses are good. She has no edema. She is alert oriented x3. Cranial nerves II through XII are intact. Gait is normal. LABORATORY DATA: ECG is normal.  Assessment / Plan:

## 2011-05-27 NOTE — Assessment & Plan Note (Signed)
Blood pressure was significantly elevated today. We will continue with her but now Cipro and amlodipine as prescribed. We will add metoprolol 25 mg b.i.d. Continue sodium restriction.

## 2011-05-27 NOTE — Patient Instructions (Signed)
We will have you wear a monitor for the next month to see if we can document any arrhythmia.  We will obtain blood work and an Echocardiogram.  We will add metoprolol 25 mg twice a day to your current medications.  I will see you again in 1 month.

## 2011-05-27 NOTE — Assessment & Plan Note (Signed)
Her symptoms are concerning for primary arrhythmia. This is associated with near syncope. I've recommended an event monitor to try and document any arrhythmia. We will obtain baseline blood work including chemistries, lipid panel, TSH, and CBC. We will schedule her for an echocardiogram. I will start her on metoprolol 25 mg twice daily in addition to her other medications and see her back again in one month.

## 2011-05-28 ENCOUNTER — Encounter: Payer: Self-pay | Admitting: Cardiology

## 2011-05-31 ENCOUNTER — Other Ambulatory Visit: Payer: Self-pay | Admitting: Gastroenterology

## 2011-06-01 NOTE — Telephone Encounter (Signed)
NEEDS OFFICE VISIT FOR ANY FURTHER REFILLS! 

## 2011-06-10 ENCOUNTER — Encounter: Payer: Self-pay | Admitting: Cardiology

## 2011-06-10 ENCOUNTER — Other Ambulatory Visit: Payer: Self-pay | Admitting: Cardiology

## 2011-06-10 ENCOUNTER — Other Ambulatory Visit (INDEPENDENT_AMBULATORY_CARE_PROVIDER_SITE_OTHER): Payer: Medicare Other | Admitting: *Deleted

## 2011-06-10 ENCOUNTER — Ambulatory Visit (HOSPITAL_COMMUNITY): Payer: Medicare Other | Attending: Cardiology | Admitting: Radiology

## 2011-06-10 DIAGNOSIS — I10 Essential (primary) hypertension: Secondary | ICD-10-CM | POA: Insufficient documentation

## 2011-06-10 DIAGNOSIS — R5381 Other malaise: Secondary | ICD-10-CM | POA: Insufficient documentation

## 2011-06-10 DIAGNOSIS — I359 Nonrheumatic aortic valve disorder, unspecified: Secondary | ICD-10-CM | POA: Insufficient documentation

## 2011-06-10 DIAGNOSIS — E7800 Pure hypercholesterolemia, unspecified: Secondary | ICD-10-CM

## 2011-06-10 DIAGNOSIS — R5383 Other fatigue: Secondary | ICD-10-CM

## 2011-06-10 DIAGNOSIS — R55 Syncope and collapse: Secondary | ICD-10-CM | POA: Insufficient documentation

## 2011-06-10 DIAGNOSIS — E785 Hyperlipidemia, unspecified: Secondary | ICD-10-CM | POA: Insufficient documentation

## 2011-06-10 DIAGNOSIS — R002 Palpitations: Secondary | ICD-10-CM | POA: Insufficient documentation

## 2011-06-10 LAB — CBC WITH DIFFERENTIAL/PLATELET
Eosinophils Relative: 1 % (ref 0.0–5.0)
HCT: 42.7 % (ref 36.0–46.0)
Hemoglobin: 14.4 g/dL (ref 12.0–15.0)
Lymphs Abs: 1.3 10*3/uL (ref 0.7–4.0)
Monocytes Relative: 5.5 % (ref 3.0–12.0)
Neutro Abs: 2.3 10*3/uL (ref 1.4–7.7)
WBC: 3.9 10*3/uL — ABNORMAL LOW (ref 4.5–10.5)

## 2011-06-10 LAB — HEPATIC FUNCTION PANEL
ALT: 10 U/L (ref 0–35)
AST: 13 U/L (ref 0–37)
Alkaline Phosphatase: 58 U/L (ref 39–117)
Bilirubin, Direct: 0.1 mg/dL (ref 0.0–0.3)
Total Bilirubin: 0.9 mg/dL (ref 0.3–1.2)

## 2011-06-10 LAB — BASIC METABOLIC PANEL
Chloride: 107 mEq/L (ref 96–112)
Potassium: 4 mEq/L (ref 3.5–5.1)

## 2011-06-10 LAB — TSH: TSH: 1.4 u[IU]/mL (ref 0.35–5.50)

## 2011-06-10 LAB — LIPID PANEL: Total CHOL/HDL Ratio: 5

## 2011-06-11 ENCOUNTER — Telehealth: Payer: Self-pay | Admitting: *Deleted

## 2011-06-11 DIAGNOSIS — E7800 Pure hypercholesterolemia, unspecified: Secondary | ICD-10-CM

## 2011-06-11 MED ORDER — ZOLPIDEM TARTRATE 5 MG PO TABS: 5.0000 mg | ORAL_TABLET | Freq: Every evening | ORAL | Status: DC | PRN

## 2011-06-11 MED ORDER — ATORVASTATIN CALCIUM 80 MG PO TABS: 80.0000 mg | ORAL_TABLET | Freq: Every day | ORAL | Status: DC

## 2011-06-11 NOTE — Telephone Encounter (Signed)
Notified of lab results and Echo. States has not been taking Simvastatin 80 mg; may take 1/4 occ. But is afraid of taking it because of what she has heard on news. Per Dr. [place] will change her to gen Lipitor 80 mg because LDL was 224. Will send echo and labs to [facility]. Will recheck labs in 3 mo.

## 2011-06-11 NOTE — Telephone Encounter (Signed)
Message copied by Lorayne Bender on Thu Jun 11, 2011 10:43 AM ------      Message from: [place], PETER M      Created: Thu Jun 11, 2011  8:12 AM       Chemistries and TSH are normal. WBC is mildly decreased. Otherwise CBC is normal. Cholesterol is markedly elevated. Last level in 8/12 was much lower. Was she taking medication before. May need to consider statin Rx.

## 2011-07-01 ENCOUNTER — Telehealth: Payer: Self-pay | Admitting: Cardiology

## 2011-07-01 NOTE — Telephone Encounter (Signed)
FYI Only:  Patient cancelled appointment for 81m follow up.  Her husband fell and broke his hip, she did not want to reschedule at this time.

## 2011-07-01 NOTE — Telephone Encounter (Signed)
Noted  Maxie Better, RN

## 2011-07-03 ENCOUNTER — Ambulatory Visit: Payer: PRIVATE HEALTH INSURANCE | Admitting: Cardiology

## 2011-07-29 ENCOUNTER — Other Ambulatory Visit: Payer: Self-pay | Admitting: Gastroenterology

## 2011-08-06 ENCOUNTER — Other Ambulatory Visit: Payer: Self-pay | Admitting: *Deleted

## 2011-08-06 MED ORDER — AMLODIPINE BESYLATE 5 MG PO TABS: 5.0000 mg | ORAL_TABLET | Freq: Every day | ORAL | Status: DC

## 2011-08-12 ENCOUNTER — Other Ambulatory Visit: Payer: Self-pay | Admitting: Gastroenterology

## 2011-08-13 ENCOUNTER — Other Ambulatory Visit: Payer: Self-pay | Admitting: Gastroenterology

## 2011-08-14 NOTE — Telephone Encounter (Signed)
NEEDS OFFICE VISIT FOR ANY FURTHER REFILLS! 

## 2011-10-16 ENCOUNTER — Other Ambulatory Visit: Payer: Self-pay | Admitting: Gastroenterology

## 2012-07-01 ENCOUNTER — Other Ambulatory Visit: Payer: Self-pay

## 2012-07-01 MED ORDER — BENAZEPRIL HCL 20 MG PO TABS: 20.0000 mg | ORAL_TABLET | Freq: Every day | ORAL | Status: DC

## 2012-09-05 ENCOUNTER — Other Ambulatory Visit: Payer: Self-pay | Admitting: Cardiology

## 2012-09-05 MED ORDER — BENAZEPRIL HCL 20 MG PO TABS: 20.0000 mg | ORAL_TABLET | Freq: Every day | ORAL | Status: DC

## 2012-10-04 ENCOUNTER — Encounter: Payer: Self-pay | Admitting: Cardiology

## 2012-10-04 ENCOUNTER — Other Ambulatory Visit: Payer: Self-pay

## 2012-10-04 ENCOUNTER — Ambulatory Visit (INDEPENDENT_AMBULATORY_CARE_PROVIDER_SITE_OTHER): Payer: Medicare Other | Admitting: Cardiology

## 2012-10-04 VITALS — BP 162/88 | HR 67 | Ht 67.0 in | Wt 136.1 lb

## 2012-10-04 DIAGNOSIS — E785 Hyperlipidemia, unspecified: Secondary | ICD-10-CM

## 2012-10-04 DIAGNOSIS — I491 Atrial premature depolarization: Secondary | ICD-10-CM

## 2012-10-04 DIAGNOSIS — I10 Essential (primary) hypertension: Secondary | ICD-10-CM

## 2012-10-04 MED ORDER — AMLODIPINE BESYLATE 5 MG PO TABS: 5.0000 mg | ORAL_TABLET | Freq: Every day | ORAL | Status: DC

## 2012-10-04 MED ORDER — DIAZEPAM 5 MG PO TABS: 5.0000 mg | ORAL_TABLET | Freq: Four times a day (QID) | ORAL | Status: DC | PRN

## 2012-10-04 MED ORDER — ATORVASTATIN CALCIUM 80 MG PO TABS: 80.0000 mg | ORAL_TABLET | Freq: Every day | ORAL | Status: DC

## 2012-10-04 MED ORDER — BENAZEPRIL HCL 20 MG PO TABS: 20.0000 mg | ORAL_TABLET | Freq: Every day | ORAL | Status: DC

## 2012-10-04 MED ORDER — METOPROLOL TARTRATE 25 MG PO TABS: 25.0000 mg | ORAL_TABLET | Freq: Two times a day (BID) | ORAL | Status: DC

## 2012-10-04 MED ORDER — PANTOPRAZOLE SODIUM 40 MG PO TBEC: 40.0000 mg | DELAYED_RELEASE_TABLET | Freq: Every day | ORAL | Status: DC

## 2012-10-04 NOTE — Patient Instructions (Signed)
Take your medication regularly.  We will check fasting lab work in 2 months.  I will see you in 1 year

## 2012-10-04 NOTE — Progress Notes (Signed)
Karla Chavez Date of Birth: 09-24-1935   History of Present Illness: Karla Chavez is seen for yearly followup of hypertension. She really comes in today to get her medications renewed. He reports she has done well over the past year with the exception of severe indigestion. She is taking over-the-counter Prilosec for this. She complains of insomnia. She admits that she hasn't taken her medications regularly.  Current Outpatient Prescriptions on File Prior to Visit  Medication Sig Dispense Refill  . [DISCONTINUED] amLODipine (NORVASC) 5 MG tablet Take 1 tablet (5 mg total) by mouth daily.  90 tablet  3  . [DISCONTINUED] atorvastatin (LIPITOR) 80 MG tablet Take 1 tablet (80 mg total) by mouth daily.  30 tablet  5  . [DISCONTINUED] benazepril (LOTENSIN) 20 MG tablet Take 1 tablet (20 mg total) by mouth daily.  30 tablet  1  . [DISCONTINUED] metoprolol tartrate (LOPRESSOR) 25 MG tablet Take 1 tablet (25 mg total) by mouth 2 (two) times daily.  60 tablet  11  . pantoprazole (PROTONIX) 40 MG tablet Take 1 tablet (40 mg total) by mouth daily.  90 tablet  3    Allergies  Allergen Reactions  . Amoxicillin     Past Medical History  Diagnosis Date  . Hypertension   . Hyperlipidemia   . Indigestion   . GERD (gastroesophageal reflux disease)   . Palpitations   . Near syncope     Past Surgical History  Procedure Date  . Tubal ligation   . Panendoscopy   . Cardiovascular stress test 01/09/2008    EF 82%    History  Smoking status  . Never Smoker   Smokeless tobacco  . Not on file    History  Alcohol Use No    Family History  Problem Relation Age of Onset  . Hypertension Mother   . Stroke Mother   . Heart disease Father     Review of Systems: As noted in history of present illness. She complains of recent pain in her right shoulder. She had received a Medrol Dosepak for this without relief. She is taking ibuprofen daily. All other systems were reviewed and are  negative.  Physical Exam: BP 162/88  Pulse 67  Ht 5\' 7"  (1.702 m)  Wt 136 lb 1.9 oz (61.744 kg)  BMI 21.32 kg/m2 She is a pleasant white female in no acute distress. She is normocephalic, atraumatic. Pupils are round and reactive to light and accommodation. Extraocular movements are full. Oropharynx is clear. Neck is supple without JVD, adenopathy, thyromegaly, or bruits. Lungs are clear. Cardiac exam reveals a regular rate and rhythm without gallop, murmur, or click. Abdomen is soft and nontender. She has no masses or bruits. Pedal pulses are good. She has no edema. She is alert oriented x3. Cranial nerves II through XII are intact. Gait is normal.  LABORATORY DATA: ECG is normal.  Assessment / Plan: 1. Hypertension, poorly controlled. She has not been taking her beta blocker. I reviewed all her prescriptions today including amlodipine, benazepril, and metoprolol. I recommended that she take them as prescribed.  2. Hypercholesterolemia. She has severe familial hypercholesterolemia with prior LDL cholesterol 224. I recommended that she take her atorvastatin 80 mg daily. I would recommend checking fasting lab work in 2 months after she has been on medication.  3. Palpitations. She has a history of PACs and short burst of PACs on previous monitor. We will continue with metoprolol.  4. Insomnia. She has tried Restoril, Ambien, and  melatonin in the past without good results. She wants to try diazepam. I've given her a prescription for 5 mg #30 tablets with no refill. Any further refills we'll need to go to her primary care.

## 2012-12-12 ENCOUNTER — Other Ambulatory Visit: Payer: Self-pay

## 2012-12-13 ENCOUNTER — Other Ambulatory Visit: Payer: Medicare Other

## 2012-12-14 NOTE — Telephone Encounter (Signed)
Please refer to pt's PCP.

## 2013-01-13 ENCOUNTER — Other Ambulatory Visit: Payer: Medicare Other

## 2013-01-16 ENCOUNTER — Other Ambulatory Visit (INDEPENDENT_AMBULATORY_CARE_PROVIDER_SITE_OTHER): Payer: Medicare Other

## 2013-01-16 LAB — HEPATIC FUNCTION PANEL
AST: 13 U/L (ref 0–37)
Albumin: 4 g/dL (ref 3.5–5.2)
Alkaline Phosphatase: 76 U/L (ref 39–117)
Total Protein: 6.6 g/dL (ref 6.0–8.3)

## 2013-01-16 LAB — BASIC METABOLIC PANEL
CO2: 28 mEq/L (ref 19–32)
GFR: 65.58 mL/min (ref >60.00)
Glucose, Bld: 93 mg/dL (ref 70–99)
Potassium: 3.7 mEq/L (ref 3.5–5.1)
Sodium: 140 mEq/L (ref 135–145)

## 2013-06-09 ENCOUNTER — Encounter: Payer: Self-pay | Admitting: *Deleted

## 2013-09-19 ENCOUNTER — Other Ambulatory Visit: Payer: Self-pay | Admitting: *Deleted

## 2013-09-19 MED ORDER — PANTOPRAZOLE SODIUM 40 MG PO TBEC: 40.0000 mg | DELAYED_RELEASE_TABLET | Freq: Every day | ORAL | Status: DC

## 2013-11-02 HISTORY — PX: OTHER SURGICAL HISTORY: SHX169

## 2013-12-06 ENCOUNTER — Other Ambulatory Visit: Payer: Medicare Other

## 2013-12-06 ENCOUNTER — Ambulatory Visit: Payer: Medicare Other | Admitting: Cardiology

## 2013-12-21 ENCOUNTER — Other Ambulatory Visit: Payer: Medicare Other

## 2013-12-21 ENCOUNTER — Ambulatory Visit: Payer: Medicare Other | Admitting: Nurse Practitioner

## 2013-12-26 ENCOUNTER — Other Ambulatory Visit: Payer: Medicare Other

## 2013-12-26 ENCOUNTER — Ambulatory Visit: Payer: Medicare Other | Admitting: Physician Assistant

## 2014-01-03 ENCOUNTER — Ambulatory Visit (INDEPENDENT_AMBULATORY_CARE_PROVIDER_SITE_OTHER): Payer: Medicare Other | Admitting: Physician Assistant

## 2014-01-03 ENCOUNTER — Encounter: Payer: Self-pay | Admitting: Physician Assistant

## 2014-01-03 ENCOUNTER — Other Ambulatory Visit: Payer: Medicare Other

## 2014-01-03 VITALS — BP 142/82 | HR 68 | Ht 67.0 in | Wt 144.0 lb

## 2014-01-03 DIAGNOSIS — R002 Palpitations: Secondary | ICD-10-CM

## 2014-01-03 DIAGNOSIS — R220 Localized swelling, mass and lump, head: Secondary | ICD-10-CM

## 2014-01-03 DIAGNOSIS — R42 Dizziness and giddiness: Secondary | ICD-10-CM | POA: Insufficient documentation

## 2014-01-03 DIAGNOSIS — I10 Essential (primary) hypertension: Secondary | ICD-10-CM

## 2014-01-03 DIAGNOSIS — K118 Other diseases of salivary glands: Secondary | ICD-10-CM

## 2014-01-03 DIAGNOSIS — R221 Localized swelling, mass and lump, neck: Secondary | ICD-10-CM

## 2014-01-03 DIAGNOSIS — E785 Hyperlipidemia, unspecified: Secondary | ICD-10-CM

## 2014-01-03 LAB — LIPID PANEL
CHOLESTEROL: 162 mg/dL (ref 0–200)
HDL: 60.1 mg/dL (ref >39.00)
LDL CALC: 81 mg/dL (ref 0–99)
Total CHOL/HDL Ratio: 3
Triglycerides: 107 mg/dL (ref 0.0–149.0)
VLDL: 21.4 mg/dL (ref 0.0–40.0)

## 2014-01-03 LAB — HEPATIC FUNCTION PANEL
ALK PHOS: 72 U/L (ref 39–117)
ALT: 17 U/L (ref 0–35)
AST: 16 U/L (ref 0–37)
Albumin: 4 g/dL (ref 3.5–5.2)
BILIRUBIN DIRECT: 0.1 mg/dL (ref 0.0–0.3)
TOTAL PROTEIN: 6.6 g/dL (ref 6.0–8.3)
Total Bilirubin: 0.9 mg/dL (ref 0.3–1.2)

## 2014-01-03 MED ORDER — PANTOPRAZOLE SODIUM 40 MG PO TBEC: 40.0000 mg | DELAYED_RELEASE_TABLET | Freq: Every day | ORAL | Status: DC

## 2014-01-03 MED ORDER — BENAZEPRIL HCL 20 MG PO TABS: 20.0000 mg | ORAL_TABLET | Freq: Every day | ORAL | Status: DC

## 2014-01-03 MED ORDER — ATORVASTATIN CALCIUM 80 MG PO TABS: 80.0000 mg | ORAL_TABLET | Freq: Every day | ORAL | Status: DC

## 2014-01-03 MED ORDER — AMLODIPINE BESYLATE 5 MG PO TABS: 5.0000 mg | ORAL_TABLET | Freq: Every day | ORAL | Status: DC

## 2014-01-03 NOTE — Patient Instructions (Signed)
Your physician recommends that you have a FASTING lipid profile, LFT  You have been referred to ENT  Your physician recommends that you schedule a follow-up appointment in: 2 months with Dr. [place]   Your physician recommends that you continue on your current medications as directed. Please refer to the Current Medication list given to you today.

## 2014-01-03 NOTE — Assessment & Plan Note (Signed)
Patient was diagnosed with vertigo and placed on meclizine. This is making her mouth very dry and she is fatigued.

## 2014-01-03 NOTE — Assessment & Plan Note (Signed)
Patient was found to have a 2 cm left parotid mass on MRI. We will refer to local ENT.

## 2014-01-03 NOTE — Assessment & Plan Note (Signed)
Blood pressure seems to be gone up and down although patient does not take her medications as prescribed. She seems to take it based on what her blood pressure readings are home. Have asked her to take her metoprolol and Lotensin on a regular basis.

## 2014-01-03 NOTE — Assessment & Plan Note (Signed)
Palpitations are off-and-on and probably worse because she's not taking her metoprolol as prescribed.

## 2014-01-03 NOTE — Progress Notes (Signed)
HPI: Karla Chavez is a 79 year old female patient Dr. Martinique who was last seen in December 2013 for hypertension, palpitations, and hyperlipidemia.  The patient [facility] [place] for dizziness. She said she was watching TV and became quite dizzy and tried to lay down and the whole bed was spinning. She was diagnosed with vertigo and placed on meclizine and Valium. She says Karla Chavez makes her mouth very dry she becomes groggy. She had blood work including troponin which was all normal. She also had an MRI that showed no acute intra-or cranial findings but she did have a 2 cm left parotid mass. They scheduled her to followup with an ENT in [place] but she says she cannot go to [place]. She is asking for referral.  The patient has not felt well overall in a while. She is very nonspecific. She has occasional palpitations. She says she takes her blood pressure daily and takes her medicine only if her blood pressure is elevated. She says at times her diastolic blood pressures in the 50s and she usually doesn't take her amlodipine. She tries to take her metoprolol twice a day but she skips Karla Chavez as well as her blood pressure is on the low side. When she was in the emergency room her blood pressure was extremely high. She is under a lot of stress because her husband had hip surgery followed by gallbladder surgery and her son is having medical problems as well.  Allergies-- Amoxicillin   Current Outpatient Prescriptions on File Prior to Visit: diazepam (VALIUM) 5 MG tablet, Take 1 tablet (5 mg total) by mouth every 6 (six) hours as needed for anxiety., Disp: 30 tablet, Rfl: 0 metoprolol tartrate (LOPRESSOR) 25 MG tablet, Take 1 tablet (25 mg total) by mouth 2 (two) times daily., Disp: 180 tablet, Rfl: 3  No current facility-administered medications on file prior to visit.   Past Medical History:   Hypertension                                                Hyperlipidemia                                               Indigestion                                                  GERD (gastroesophageal reflux disease)                       Palpitations                                                 Near syncope  Past Surgical History:   TUBAL LIGATION                                                PANENDOSCOPY                                                  CARDIOVASCULAR STRESS TEST                       01/09/2008       Comment:EF 82%  Review of patient's family history indicates:   Hypertension                   Mother                   Stroke                         Mother                   Heart disease                  Father                   Social History   Marital Status: Married             Spouse Name:                      Years of Education:                 Number of children: 4           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 Neck: No JVD, HJR, Bruit, or thyroid enlargement  Lungs: No tachypnea, clear without wheezing, rales, or rhonchi  Cardiovascular: RRR, PMI not displaced, heart sounds normal, no murmurs, gallops,  bruit, thrill, or heave.  Abdomen: BS normal. Soft without organomegaly, masses, lesions or tenderness.  Extremities: without cyanosis, clubbing or edema. Good distal pulses bilateral  SKin: Warm, no lesions or rashes   Musculoskeletal: No  deformities  Neuro: no focal signs  BP 142/82  Pulse 68  Ht 5\' 7"  (1.702 m)  Wt 144 lb (65.318 kg)  BMI 22.55 kg/m2   EKG: Patient refused EKG because she had it done in the emergency room although we have no copy of Karla Chavez.

## 2014-01-04 ENCOUNTER — Other Ambulatory Visit: Payer: Self-pay | Admitting: Otolaryngology

## 2014-01-04 ENCOUNTER — Other Ambulatory Visit (HOSPITAL_COMMUNITY)
Admission: RE | Admit: 2014-01-04 | Discharge: 2014-01-04 | Disposition: A | Discharge status: Home / Self Care | Payer: Medicare Other | Source: Ambulatory Visit | Attending: Otolaryngology | Admitting: Otolaryngology

## 2014-01-04 DIAGNOSIS — R220 Localized swelling, mass and lump, head: Secondary | ICD-10-CM | POA: Insufficient documentation

## 2014-01-04 DIAGNOSIS — R221 Localized swelling, mass and lump, neck: Principal | ICD-10-CM

## 2014-01-18 ENCOUNTER — Other Ambulatory Visit: Payer: Self-pay

## 2014-01-18 MED ORDER — ATORVASTATIN CALCIUM 40 MG PO TABS: 40.0000 mg | ORAL_TABLET | Freq: Every day | ORAL | Status: DC

## 2014-01-22 ENCOUNTER — Other Ambulatory Visit (HOSPITAL_COMMUNITY): Payer: Self-pay | Admitting: Otolaryngology

## 2014-01-22 DIAGNOSIS — K118 Other diseases of salivary glands: Secondary | ICD-10-CM

## 2014-01-26 ENCOUNTER — Encounter (HOSPITAL_COMMUNITY): Payer: Self-pay | Admitting: Pharmacy Technician

## 2014-01-31 ENCOUNTER — Other Ambulatory Visit: Payer: Self-pay | Admitting: Radiology

## 2014-02-01 ENCOUNTER — Ambulatory Visit (HOSPITAL_COMMUNITY)
Admission: RE | Admit: 2014-02-01 | Discharge: 2014-02-01 | Disposition: A | Discharge status: Home / Self Care | Payer: Medicare Other | Source: Ambulatory Visit | Attending: Otolaryngology | Admitting: Otolaryngology

## 2014-02-01 DIAGNOSIS — K119 Disease of salivary gland, unspecified: Secondary | ICD-10-CM | POA: Insufficient documentation

## 2014-02-01 DIAGNOSIS — R220 Localized swelling, mass and lump, head: Secondary | ICD-10-CM | POA: Insufficient documentation

## 2014-02-01 DIAGNOSIS — K118 Other diseases of salivary glands: Secondary | ICD-10-CM

## 2014-02-01 DIAGNOSIS — R221 Localized swelling, mass and lump, neck: Secondary | ICD-10-CM

## 2014-02-01 LAB — CBC
HEMATOCRIT: 42.4 % (ref 36.0–46.0)
Hemoglobin: 14.6 g/dL (ref 12.0–15.0)
MCH: 32.2 pg (ref 26.0–34.0)
MCHC: 34.4 g/dL (ref 30.0–36.0)
MCV: 93.4 fL (ref 78.0–100.0)
PLATELETS: 197 10*3/uL (ref 150–400)
RBC: 4.54 MIL/uL (ref 3.87–5.11)
RDW: 12.6 % (ref 11.5–15.5)
WBC: 5.2 10*3/uL (ref 4.0–10.5)

## 2014-02-01 LAB — PROTIME-INR
INR: 0.84 (ref 0.00–1.49)
Prothrombin Time: 11.4 seconds — ABNORMAL LOW (ref 11.6–15.2)

## 2014-02-01 LAB — APTT: aPTT: 27 seconds (ref 24–37)

## 2014-02-01 MED ORDER — SODIUM CHLORIDE 0.9 % IV SOLN
INTRAVENOUS | Status: DC
  Administered 2014-02-01: 09:00:00 via INTRAVENOUS

## 2014-02-01 MED ORDER — FENTANYL CITRATE 0.05 MG/ML IJ SOLN
INTRAMUSCULAR | Status: AC
  Filled 2014-02-01: qty 2

## 2014-02-01 NOTE — Sedation Documentation (Signed)
Pt will not be sedated for procedure

## 2014-02-01 NOTE — Procedures (Signed)
Procedure:  Ultrasound guided left parotid aspiration Findings:  2.4 cm left parotid complex cyst.  1 mL of bloody, thick fluid aspirated from 20 G needle.  Sent for cytologic examination.

## 2014-02-02 ENCOUNTER — Other Ambulatory Visit: Payer: Self-pay

## 2014-02-02 MED ORDER — METOPROLOL TARTRATE 25 MG PO TABS: 25.0000 mg | ORAL_TABLET | Freq: Two times a day (BID) | ORAL | Status: DC

## 2014-02-15 ENCOUNTER — Encounter (HOSPITAL_COMMUNITY): Payer: Self-pay | Admitting: Pharmacy Technician

## 2014-02-19 NOTE — Pre-Procedure Instructions (Signed)
ODESTER NILSON  02/19/2014   Your procedure is scheduled on:  Thursday March 01, 2014 at 7:30 AM.  Report to [facility] Short Stay Entrance "A"  Admitting at 5:30 AM.  Call this number if you have problems the morning of surgery: [phone]   Remember:   Do not eat food or drink liquids after midnight.   Take these medicines the morning of surgery with A SIP OF WATER: Metoprolol (Lopressor), Pantoprazole (Protonix), and Benazepril    Do not wear jewelry, make-up or nail polish.  Do not wear lotions, powders, or perfumes.   Do not shave 48 hours prior to surgery.   Do not bring valuables to the hospital.  [facility] is not responsible for any belongings or valuables.               Contacts, dentures or bridgework may not be worn into surgery.  Leave suitcase in the car. After surgery it may be brought to your room.  For patients admitted to the hospital, discharge time is determined by your treatment team.               Patients discharged the day of surgery will not be allowed to drive home.  Name and phone number of your driver: Family/Friend  Special Instructions: Shower using CHG soap the night before and the morning of your surgery   Please read over the following fact sheets that you were given: Pain Booklet, Coughing and Deep Breathing and Surgical Site Infection Prevention

## 2014-02-20 ENCOUNTER — Encounter (HOSPITAL_COMMUNITY): Payer: Self-pay

## 2014-02-20 ENCOUNTER — Encounter (HOSPITAL_COMMUNITY)
Admission: RE | Admit: 2014-02-20 | Discharge: 2014-02-20 | Disposition: A | Discharge status: Home / Self Care | Payer: Medicare Other | Source: Ambulatory Visit | Attending: Otolaryngology | Admitting: Otolaryngology

## 2014-02-20 ENCOUNTER — Encounter (HOSPITAL_COMMUNITY)
Admission: RE | Admit: 2014-02-20 | Discharge: 2014-02-20 | Disposition: A | Discharge status: Home / Self Care | Payer: Medicare Other | Source: Ambulatory Visit | Attending: Anesthesiology | Admitting: Anesthesiology

## 2014-02-20 DIAGNOSIS — Z01812 Encounter for preprocedural laboratory examination: Secondary | ICD-10-CM | POA: Insufficient documentation

## 2014-02-20 DIAGNOSIS — Z01818 Encounter for other preprocedural examination: Secondary | ICD-10-CM | POA: Insufficient documentation

## 2014-02-20 HISTORY — DX: Dizziness and giddiness: R42

## 2014-02-20 HISTORY — DX: Family history of other specified conditions: Z84.89

## 2014-02-20 HISTORY — DX: Unspecified osteoarthritis, unspecified site: M19.90

## 2014-02-20 HISTORY — DX: Pneumonia, unspecified organism: J18.9

## 2014-02-20 LAB — BASIC METABOLIC PANEL
BUN: 15 mg/dL (ref 6–23)
CO2: 25 mEq/L (ref 19–32)
Calcium: 9.8 mg/dL (ref 8.4–10.5)
Chloride: 107 mEq/L (ref 96–112)
Creatinine, Ser: 0.94 mg/dL (ref 0.50–1.10)
GFR calc Af Amer: 67 mL/min — ABNORMAL LOW (ref >90)
GFR calc non Af Amer: 57 mL/min — ABNORMAL LOW (ref >90)
Glucose, Bld: 109 mg/dL — ABNORMAL HIGH (ref 70–99)
Potassium: 4.4 mEq/L (ref 3.7–5.3)
Sodium: 145 mEq/L (ref 137–147)

## 2014-02-20 LAB — CBC
HCT: 42.7 % (ref 36.0–46.0)
Hemoglobin: 14.6 g/dL (ref 12.0–15.0)
MCH: 32.1 pg (ref 26.0–34.0)
MCHC: 34.2 g/dL (ref 30.0–36.0)
MCV: 93.8 fL (ref 78.0–100.0)
PLATELETS: 237 10*3/uL (ref 150–400)
RBC: 4.55 MIL/uL (ref 3.87–5.11)
RDW: 12.7 % (ref 11.5–15.5)
WBC: 6.7 10*3/uL (ref 4.0–10.5)

## 2014-02-20 NOTE — Progress Notes (Signed)
Patient informed Nurse that her PCP is Bing Matter in [place], [place]. Cardiologist is Dr. Peter Martinique. Patient informed Nurse that she has heart palpitations daily however today they were not "as bad" and patient stated Dr. Martinique was aware and she wore a heart monitor in the past. Patient denied having any acute chest pain, discomfort, or shortness of breath. During PAT visit reviewed past vital signs and noticed the blood pressure to be elevated. When asked about it patient stated "yes sometimes my blood pressure runs high. I'm supposed to be taking Amlodipine but I haven't been taking it because when I check it at home it is always low and I don't want to make it even lower. I go to see Dr. Martinique soon and I'm sure he's going to tell me to take it." Since patients blood pressure appears to be elevated for office visits Nurse instructed patient to take Benazepril DOS. Patient verbalized understanding. There was a EKG in [facility] on 01/02/14 however no tracing was found. Will request tracing from [place] as that is where patient stated EKG occurred. Will have [place], [place] review chart.

## 2014-02-21 NOTE — Progress Notes (Signed)
Anesthesia Chart Review:  Patient is a 79 year old female scheduled for left superficial versus total parotidectomy on 8/00/34 by Dr. Erik Obey. 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  History includes non-smoker, HTN, GERD, HLD, palpitations on metoprolol, vertigo 12/2013, near syncope in the past, indigestion, arthritis, appendectomy, sister with severe post-operative N/V. PCP is Bing Matter, PA-C in [place]. Cardiologist is Dr. Peter Martinique, with last visit with Orpah Melter, PA-C on 01/03/14.  Her BP has been high in the past, but was controlled at 135/70 at PAT, so she was instructed to take both her metoprolol and benazepril on the morning of surgery.  She has no history of CKD or DM.  EKG on 01/01/14 [facility]) showed: NSR, non-specific ST/T wave abnormality.  Echo on 06/10/11 showed: - Left ventricle: There may be some increase in LV filling pressure.The cavity size was normal. Wall thickness was normal. The estimated ejection fraction was 60%. Wall motion was normal; there were no regional wall motion abnormalities. Doppler parameters are consistent with abnormal left ventricular relaxation (grade 1 diastolic dysfunction). - Aortic valve: Sclerosis without stenosis. Mild regurgitation. - Aorta: The root is 43mm. The ascending aorta is larger at 59mm.  Event monitor in 06/2011 showed occasional PVCs, PACs, short runs of PACs less than or equal to 5 beats.  She had a normal stress nuclear study on 01/09/08.  CXR on 02/20/14 showed: COPD changes. Enlargement of cardiac silhouette. Question vague area of infiltrate versus nodule lower left lung on PA view, not definitely localized on lateral view. Either radiographic followup until resolution or CT chest recommended to exclude pulmonary nodule. Report called to Dr. Erik Obey.  He is planning to order a preoperative chest CT for further evaluation.  Preoperative labs noted.  If CT scan show lung nodule, Dr. Erik Obey will consider  postponing surgery until further evaluated.  Will have to await report results.    [facility] Short Stay Center/Anesthesiology Phone [phone] 02/21/2014 2:26 PM

## 2014-02-22 ENCOUNTER — Other Ambulatory Visit: Payer: Self-pay | Admitting: Otolaryngology

## 2014-02-22 ENCOUNTER — Other Ambulatory Visit (HOSPITAL_COMMUNITY): Payer: Medicare Other

## 2014-02-22 DIAGNOSIS — R9389 Abnormal findings on diagnostic imaging of other specified body structures: Secondary | ICD-10-CM

## 2014-02-22 DIAGNOSIS — Z01818 Encounter for other preprocedural examination: Secondary | ICD-10-CM

## 2014-02-27 ENCOUNTER — Ambulatory Visit
Admission: RE | Admit: 2014-02-27 | Discharge: 2014-02-27 | Disposition: A | Discharge status: Home / Self Care | Payer: Medicare Other | Source: Ambulatory Visit | Attending: Otolaryngology | Admitting: Otolaryngology

## 2014-02-27 DIAGNOSIS — Z01818 Encounter for other preprocedural examination: Secondary | ICD-10-CM

## 2014-02-27 DIAGNOSIS — R9389 Abnormal findings on diagnostic imaging of other specified body structures: Secondary | ICD-10-CM

## 2014-02-27 MED ORDER — IOHEXOL 300 MG/ML  SOLN
75.0000 mL | Freq: Once | INTRAMUSCULAR | Status: AC | PRN
  Administered 2014-02-27: 75 mL via INTRAVENOUS

## 2014-02-28 ENCOUNTER — Other Ambulatory Visit (HOSPITAL_BASED_OUTPATIENT_CLINIC_OR_DEPARTMENT_OTHER): Payer: Self-pay | Admitting: Otolaryngology

## 2014-02-28 NOTE — H&P (Signed)
Karla Chavez, Karla Chavez 79 y.o., female [id number]     Chief Complaint: LEFt parotid mass  HPI: 79 year old white female is referred over for evaluation of an incidental finding of a RIGHT parotid neoplasm.  3 days ago, she had sudden onset of oozing this including vertigo, worse in the supine position.  No loss of consciousness.  She went to the emergency room.  Her blood pressure was extremely elevated.  She had a CT scan of the head which was negative for brain tumor or stroke, but did reportedly demonstrate a 2 cm parotid mass on the LEFT.  I do not have these films to review at present.  She was unaware of this mass.  No trismus.  No facial weakness.  No swelling with meals.  For the dizziness, she was given meclizine which made her extremely dry but not sleepy, and a little bit of Valium.  3 week return visit for preoperative evaluation.  She has a LEFT parotid mass with an equivocal ultrasound-guided needle aspiration.  It is not Warthin's tumor.  She is quite apprehensive about the possibility of facial weakness.  I explained this was my priority also, but I did think this needed to be removed.     I discussed the surgery in detail including risks and complications.  Questions were answered and informed consent was obtained.  She has issues with hypertension and reflux.  I talked with Dr. Kathryne Eriksson who did not feel like there were any contraindications to anesthesia and surgery.  I will talk with Dr. Peter Martinique, her cardiologist, also.  I discussed her hospital stay, and postoperative recuperation.  Vicodin for pain relief.  Return visit here 10 days   PMH: Past Medical History  Diagnosis Date  . Hypertension   . Hyperlipidemia   . Indigestion   . GERD (gastroesophageal reflux disease)   . Palpitations   . Near syncope   . Family history of anesthesia complication     pts sister has severe nausea and vomiting  . Pneumonia     hx of  . Vertigo     hx of occured on 01/01/14  . Arthritis      Surg Hx: Past Surgical History  Procedure Laterality Date  . Tubal ligation    . Panendoscopy    . Cardiovascular stress test  01/09/2008    EF 82%  . Colonoscopy    . Appendectomy      FHx:   Family History  Problem Relation Age of Onset  . Hypertension Mother   . Stroke Mother   . Heart disease Father    SocHx:  reports that she has never smoked. She does not have any smokeless tobacco history on file. She reports that she does not drink alcohol or use illicit drugs.  ALLERGIES:  Allergies  Allergen Reactions  . Amoxicillin     Yeast infection     (Not in a hospital admission)  No results found for this or any previous visit (from the past 48 hour(s)). Ct Chest W Contrast  02/27/2014   CLINICAL DATA:  Left lung opacity on preop chest x-ray.  EXAM: CT CHEST WITH CONTRAST  TECHNIQUE: Multidetector CT imaging of the chest was performed during intravenous contrast administration.  CONTRAST:  73mL OMNIPAQUE IOHEXOL 300 MG/ML  SOLN  COMPARISON:  Chest x-ray 02/20/2014.  Chest CT 06/25/2009.  FINDINGS: There is an area of linear density in the left lower lobe extending from the left lung base to the superior segment.  Along the superior and posterior aspect of this linear density is an area of mixed solid and sub solid nodularity. The solid component measures 2.3 x 1.6 cm, with larger surrounding sub solid components. This correlates with the density seen on chest x-ray.  No other suspicious nodular areas. No pleural effusions. Mild hyperinflation of the lungs. Mild ectasia of the ascending thoracic aorta measuring 3.7 cm maximally near the aortic root. No evidence of aortic aneurysm or dissection. Scattered coronary artery calcifications in the left anterior descending coronary artery.  No mediastinal, hilar, or axillary adenopathy. Chest wall soft tissues are unremarkable. Imaging into the upper abdomen shows no acute findings.  No acute bony abnormality or focal bone lesion.   IMPRESSION: Linear area of scarring in the left lower lobe with a more nodular solid and sub solid component superiorly, corresponding to the density seen on chest x-ray. This may simply reflect an area of nodular scarring, but followup CT in 3-6 months or further characterization with PET CT is recommended.  Coronary artery calcifications.   Electronically Signed   By: Rolm Baptise M.D.   On: 02/27/2014 17:04    GYI:RSWNIOEV: Feeling tired (fatigue).  No fever, no night sweats, and no recent weight loss. Head: No headache. Eyes: No eye symptoms. Otolaryngeal: No hearing loss, no earache, no tinnitus, and no purulent nasal discharge.  No nasal passage blockage (stuffiness).  Snoring  and sneezing.  No hoarseness  and no sore throat. Cardiovascular: No chest pain or discomfort.  Palpitations. Pulmonary: No dyspnea, no cough, and no wheezing. Gastrointestinal: No dysphagia.  Heartburn.  No nausea, no abdominal pain, and no melena.  No diarrhea. Genitourinary: No dysuria. Endocrine: Muscle weakness. Musculoskeletal: No calf muscle cramps, no arthralgias, and no soft tissue swelling. Neurological: Dizziness.  No fainting.  Tingling  and numbness. Psychological: Anxiety  and depression. Skin: No rash.  There were no vitals taken for this visit.  PHYSICAL EXAM: She is trim, cheerful, and energetic.  Mental status is sharp.  She hears well in conversational speech.  Voice is clear and respirations unlabored through the nose.  The head is atraumatic and neck supple.  Cranial nerves intact including facial nerve on both sides.  Ear canals are clear with normal drums.  Anterior nose is moist with a slight rightward septal deviation.  Oral cavity is moist with a full upper and partial lower plate.  She has 1+ tonsils with no visible cryptic debris.  Neck examination is remarkable for a subtle fullness behind the angle of the mandible in the LEFT parotid.  No other neck adenopathy.  No thyromegaly.  Lungs:  Clear to auscultation Heart: Slow rate and rhythm without murmurs Abdomen: Soft, active Extremities: Normal configuration Neurologic: Symmetric, grossly intact.  Facial nerve is entirely intact both sides.  Spinal accessory nerve intact on the LEFT.  Studies Reviewed:  MRI neck.  FNA cytology x 2, CT chest.    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  You have a tumor in your LEFT parotid gland.  It is probably not cancer.  I do recommend that we remove it.  This will involve dissecting either above, or below, or both the LEFT facial nerve.  The surgery will take 2-3 hours probably.  you will stay overnight in the hospital one night.  I would like you to buy some Hibiclens (chlorhexidine) liquid surgical scrub soap and shower with this the evening before surgery and the morning of.  Hydrocodone is for  pain relief.  I will see you here in the office 10 days after surgery.  Hydrocodone-Acetaminophen 5-325 MG Oral Tablet;1-2 po q4-6h prn pain; YCX44; R0;   Jodi Marble 06/19/5630, 4:97 PM

## 2014-03-01 ENCOUNTER — Ambulatory Visit (HOSPITAL_COMMUNITY): Payer: Medicare Other | Admitting: Anesthesiology

## 2014-03-01 ENCOUNTER — Observation Stay (HOSPITAL_COMMUNITY)
Admission: RE | Admit: 2014-03-01 | Discharge: 2014-03-02 | Disposition: A | Discharge status: Home / Self Care | Payer: Medicare Other | Source: Ambulatory Visit | Attending: Otolaryngology | Admitting: Otolaryngology

## 2014-03-01 ENCOUNTER — Encounter (HOSPITAL_COMMUNITY): Payer: Medicare Other | Admitting: Vascular Surgery

## 2014-03-01 ENCOUNTER — Encounter (HOSPITAL_COMMUNITY)
Admission: RE | Disposition: A | Discharge status: Home / Self Care | Payer: Self-pay | Source: Ambulatory Visit | Attending: Otolaryngology

## 2014-03-01 ENCOUNTER — Encounter (HOSPITAL_COMMUNITY): Payer: Self-pay | Admitting: *Deleted

## 2014-03-01 DIAGNOSIS — E785 Hyperlipidemia, unspecified: Secondary | ICD-10-CM | POA: Insufficient documentation

## 2014-03-01 DIAGNOSIS — M129 Arthropathy, unspecified: Secondary | ICD-10-CM | POA: Insufficient documentation

## 2014-03-01 DIAGNOSIS — D119 Benign neoplasm of major salivary gland, unspecified: Principal | ICD-10-CM | POA: Insufficient documentation

## 2014-03-01 DIAGNOSIS — I10 Essential (primary) hypertension: Secondary | ICD-10-CM | POA: Insufficient documentation

## 2014-03-01 DIAGNOSIS — K219 Gastro-esophageal reflux disease without esophagitis: Secondary | ICD-10-CM | POA: Insufficient documentation

## 2014-03-01 DIAGNOSIS — D37030 Neoplasm of uncertain behavior of the parotid salivary glands: Secondary | ICD-10-CM | POA: Diagnosis present

## 2014-03-01 HISTORY — DX: Nausea with vomiting, unspecified: R11.2

## 2014-03-01 HISTORY — DX: Other specified postprocedural states: Z98.890

## 2014-03-01 HISTORY — PX: PAROTIDECTOMY: SHX2163

## 2014-03-01 HISTORY — DX: Personal history of other medical treatment: Z92.89

## 2014-03-01 SURGERY — EXCISION, PAROTID GLAND
Anesthesia: General | Site: Face | Laterality: Left

## 2014-03-01 MED ORDER — OXYCODONE HCL 5 MG PO TABS: 5.0000 mg | ORAL_TABLET | Freq: Once | ORAL | Status: DC | PRN

## 2014-03-01 MED ORDER — PHENOL 1.4 % MT LIQD: 2.0000 | Freq: Four times a day (QID) | OROMUCOSAL | Status: DC | PRN

## 2014-03-01 MED ORDER — EPHEDRINE SULFATE 50 MG/ML IJ SOLN
INTRAMUSCULAR | Status: AC
  Filled 2014-03-01: qty 1

## 2014-03-01 MED ORDER — PHENYLEPHRINE 40 MCG/ML (10ML) SYRINGE FOR IV PUSH (FOR BLOOD PRESSURE SUPPORT)
PREFILLED_SYRINGE | INTRAVENOUS | Status: AC
  Filled 2014-03-01: qty 10

## 2014-03-01 MED ORDER — LIDOCAINE-EPINEPHRINE 1 %-1:100000 IJ SOLN
INTRAMUSCULAR | Status: AC
  Filled 2014-03-01: qty 1

## 2014-03-01 MED ORDER — ARTIFICIAL TEARS OP OINT
TOPICAL_OINTMENT | OPHTHALMIC | Status: AC
  Filled 2014-03-01: qty 3.5

## 2014-03-01 MED ORDER — OXYCODONE HCL 5 MG/5ML PO SOLN: 5.0000 mg | Freq: Once | ORAL | Status: DC | PRN

## 2014-03-01 MED ORDER — ACETAMINOPHEN 80 MG PO CHEW
320.0000 mg | CHEWABLE_TABLET | ORAL | Status: DC | PRN
  Filled 2014-03-01: qty 4

## 2014-03-01 MED ORDER — PROPOFOL 10 MG/ML IV BOLUS
INTRAVENOUS | Status: DC | PRN
  Administered 2014-03-01: 120 mg via INTRAVENOUS
  Administered 2014-03-01: 30 mg via INTRAVENOUS

## 2014-03-01 MED ORDER — METOPROLOL TARTRATE 25 MG PO TABS
25.0000 mg | ORAL_TABLET | Freq: Two times a day (BID) | ORAL | Status: DC
  Administered 2014-03-01: 25 mg via ORAL
  Filled 2014-03-01 (×3): qty 1

## 2014-03-01 MED ORDER — ARTIFICIAL TEARS OP OINT
TOPICAL_OINTMENT | Freq: Every day | OPHTHALMIC | Status: DC
  Administered 2014-03-01: 22:00:00 via OPHTHALMIC
  Filled 2014-03-01: qty 3.5

## 2014-03-01 MED ORDER — SODIUM CHLORIDE 0.9 % IJ SOLN
INTRAMUSCULAR | Status: AC
  Filled 2014-03-01: qty 10

## 2014-03-01 MED ORDER — ALPRAZOLAM 0.25 MG PO TABS
0.2500 mg | ORAL_TABLET | Freq: Every day | ORAL | Status: DC
  Administered 2014-03-01: 0.25 mg via ORAL
  Filled 2014-03-01: qty 1

## 2014-03-01 MED ORDER — ROCURONIUM BROMIDE 50 MG/5ML IV SOLN
INTRAVENOUS | Status: AC
  Filled 2014-03-01: qty 1

## 2014-03-01 MED ORDER — PANTOPRAZOLE SODIUM 40 MG PO TBEC
40.0000 mg | DELAYED_RELEASE_TABLET | Freq: Every day | ORAL | Status: DC
  Filled 2014-03-01: qty 1

## 2014-03-01 MED ORDER — ONDANSETRON HCL 4 MG/2ML IJ SOLN
4.0000 mg | INTRAMUSCULAR | Status: DC | PRN
  Administered 2014-03-01 (×2): 4 mg via INTRAVENOUS
  Filled 2014-03-01 (×2): qty 2

## 2014-03-01 MED ORDER — PHENYLEPHRINE HCL 10 MG/ML IJ SOLN
INTRAMUSCULAR | Status: DC | PRN
  Administered 2014-03-01: 120 ug via INTRAVENOUS

## 2014-03-01 MED ORDER — EPHEDRINE SULFATE 50 MG/ML IJ SOLN
INTRAMUSCULAR | Status: DC | PRN
  Administered 2014-03-01 (×2): 10 mg via INTRAVENOUS
  Administered 2014-03-01 (×2): 15 mg via INTRAVENOUS

## 2014-03-01 MED ORDER — HYDROMORPHONE HCL PF 1 MG/ML IJ SOLN
INTRAMUSCULAR | Status: AC
  Filled 2014-03-01: qty 1

## 2014-03-01 MED ORDER — PROPOFOL 10 MG/ML IV BOLUS
INTRAVENOUS | Status: AC
  Filled 2014-03-01: qty 20

## 2014-03-01 MED ORDER — 0.9 % SODIUM CHLORIDE (POUR BTL) OPTIME
TOPICAL | Status: DC | PRN
  Administered 2014-03-01: 1000 mL

## 2014-03-01 MED ORDER — GLYCOPYRROLATE 0.2 MG/ML IJ SOLN
INTRAMUSCULAR | Status: DC | PRN
  Administered 2014-03-01: 0.2 mg via INTRAVENOUS

## 2014-03-01 MED ORDER — LACTATED RINGERS IV SOLN
INTRAVENOUS | Status: DC | PRN
  Administered 2014-03-01: 07:00:00 via INTRAVENOUS

## 2014-03-01 MED ORDER — HYDROCODONE-ACETAMINOPHEN 5-325 MG PO TABS: 1.0000 | ORAL_TABLET | ORAL | Status: DC | PRN

## 2014-03-01 MED ORDER — ONDANSETRON HCL 4 MG/2ML IJ SOLN
INTRAMUSCULAR | Status: DC | PRN
  Administered 2014-03-01: 4 mg via INTRAVENOUS

## 2014-03-01 MED ORDER — POLYVINYL ALCOHOL 1.4 % OP SOLN
2.0000 [drp] | OPHTHALMIC | Status: DC
  Administered 2014-03-01 – 2014-03-02 (×3): 2 [drp] via OPHTHALMIC
  Filled 2014-03-01: qty 15

## 2014-03-01 MED ORDER — POLYVINYL ALCOHOL 1.4 % OP SOLN
1.0000 [drp] | OPHTHALMIC | Status: DC | PRN
  Filled 2014-03-01: qty 15

## 2014-03-01 MED ORDER — PANTOPRAZOLE SODIUM 40 MG IV SOLR
40.0000 mg | Freq: Once | INTRAVENOUS | Status: AC
  Administered 2014-03-01: 40 mg via INTRAVENOUS
  Filled 2014-03-01 (×2): qty 40

## 2014-03-01 MED ORDER — SUCCINYLCHOLINE CHLORIDE 20 MG/ML IJ SOLN
INTRAMUSCULAR | Status: DC | PRN
  Administered 2014-03-01: 110 mg via INTRAVENOUS

## 2014-03-01 MED ORDER — LIDOCAINE-EPINEPHRINE 1 %-1:100000 IJ SOLN
INTRAMUSCULAR | Status: DC | PRN
  Administered 2014-03-01: 20 mL

## 2014-03-01 MED ORDER — PROMETHAZINE HCL 25 MG/ML IJ SOLN: 6.2500 mg | INTRAMUSCULAR | Status: DC | PRN

## 2014-03-01 MED ORDER — SODIUM CHLORIDE 0.9 % IV SOLN
10.0000 mg | INTRAVENOUS | Status: DC | PRN
  Administered 2014-03-01: 50 ug/min via INTRAVENOUS

## 2014-03-01 MED ORDER — MORPHINE SULFATE 2 MG/ML IJ SOLN: 1.0000 mg | INTRAMUSCULAR | Status: DC | PRN

## 2014-03-01 MED ORDER — CHLORHEXIDINE GLUCONATE 4 % EX LIQD
1.0000 "application " | Freq: Once | CUTANEOUS | Status: DC
  Filled 2014-03-01: qty 15

## 2014-03-01 MED ORDER — HYDRALAZINE HCL 20 MG/ML IJ SOLN: 5.0000 mg | Freq: Three times a day (TID) | INTRAMUSCULAR | Status: DC | PRN

## 2014-03-01 MED ORDER — CLINDAMYCIN PHOSPHATE 600 MG/50ML IV SOLN
INTRAVENOUS | Status: AC
  Filled 2014-03-01: qty 50

## 2014-03-01 MED ORDER — BACITRACIN ZINC 500 UNIT/GM EX OINT
TOPICAL_OINTMENT | CUTANEOUS | Status: AC
  Filled 2014-03-01: qty 15

## 2014-03-01 MED ORDER — ONDANSETRON HCL 4 MG PO TABS: 4.0000 mg | ORAL_TABLET | ORAL | Status: DC | PRN

## 2014-03-01 MED ORDER — CLINDAMYCIN PHOSPHATE 300 MG/2ML IJ SOLN
600.0000 mg | Freq: Once | INTRAMUSCULAR | Status: AC
  Administered 2014-03-01: 600 mg via INTRAVENOUS
  Filled 2014-03-01: qty 4

## 2014-03-01 MED ORDER — LIDOCAINE HCL (CARDIAC) 20 MG/ML IV SOLN
INTRAVENOUS | Status: DC | PRN
  Administered 2014-03-01: 70 mg via INTRAVENOUS

## 2014-03-01 MED ORDER — HYDROMORPHONE HCL PF 1 MG/ML IJ SOLN
0.2500 mg | INTRAMUSCULAR | Status: DC | PRN
  Administered 2014-03-01 (×2): 0.25 mg via INTRAVENOUS

## 2014-03-01 MED ORDER — LIDOCAINE HCL (CARDIAC) 20 MG/ML IV SOLN
INTRAVENOUS | Status: AC
  Filled 2014-03-01: qty 5

## 2014-03-01 MED ORDER — FENTANYL CITRATE 0.05 MG/ML IJ SOLN
INTRAMUSCULAR | Status: DC | PRN
  Administered 2014-03-01 (×5): 50 ug via INTRAVENOUS

## 2014-03-01 MED ORDER — DEXTROSE-NACL 5-0.45 % IV SOLN
INTRAVENOUS | Status: DC
  Administered 2014-03-01 (×2): via INTRAVENOUS

## 2014-03-01 MED ORDER — FENTANYL CITRATE 0.05 MG/ML IJ SOLN
INTRAMUSCULAR | Status: AC
  Filled 2014-03-01: qty 5

## 2014-03-01 MED ORDER — ONDANSETRON HCL 4 MG/2ML IJ SOLN
INTRAMUSCULAR | Status: AC
  Filled 2014-03-01: qty 2

## 2014-03-01 MED ORDER — BENAZEPRIL HCL 20 MG PO TABS
20.0000 mg | ORAL_TABLET | Freq: Every day | ORAL | Status: DC
  Filled 2014-03-01: qty 1

## 2014-03-01 SURGICAL SUPPLY — 63 items
ADH SKN CLS APL DERMABOND .7 (GAUZE/BANDAGES/DRESSINGS) ×1
ATTRACTOMAT 16X20 MAGNETIC DRP (DRAPES) ×2 IMPLANT
BANDAGE GAUZE ELAST BULKY 4 IN (GAUZE/BANDAGES/DRESSINGS) IMPLANT
BLADE 10 SAFETY STRL DISP (BLADE) ×1 IMPLANT
BLADE 15 SAFETY STRL DISP (BLADE) ×2 IMPLANT
BLADE SCALPEL SHAW  SZ15 (BLADE) ×4 IMPLANT
BLADE SURG ROTATE 9660 (MISCELLANEOUS) ×2 IMPLANT
CANISTER SUCTION 2500CC (MISCELLANEOUS) ×3 IMPLANT
CLEANER TIP ELECTROSURG 2X2 (MISCELLANEOUS) ×3 IMPLANT
CONT SPEC 4OZ CLIKSEAL STRL BL (MISCELLANEOUS) ×1 IMPLANT
CORDS BIPOLAR (ELECTRODE) ×3 IMPLANT
COVER SURGICAL LIGHT HANDLE (MISCELLANEOUS) ×3 IMPLANT
CRADLE DONUT ADULT HEAD (MISCELLANEOUS) IMPLANT
DERMABOND ADVANCED (GAUZE/BANDAGES/DRESSINGS) ×2
DERMABOND ADVANCED .7 DNX12 (GAUZE/BANDAGES/DRESSINGS) IMPLANT
DRAIN SNY 10 ROU (WOUND CARE) ×3 IMPLANT
DRAPE INCISE 13X13 STRL (DRAPES) ×3 IMPLANT
ELECT COATED BLADE 2.86 ST (ELECTRODE) ×3 IMPLANT
ELECT REM PT RETURN 9FT ADLT (ELECTROSURGICAL) ×3
ELECTRODE REM PT RTRN 9FT ADLT (ELECTROSURGICAL) ×1 IMPLANT
EVACUATOR SILICONE 100CC (DRAIN) ×2 IMPLANT
GAUZE SPONGE 4X4 16PLY XRAY LF (GAUZE/BANDAGES/DRESSINGS) ×6 IMPLANT
GLOVE BIO SURGEON STRL SZ 6.5 (GLOVE) ×1 IMPLANT
GLOVE BIO SURGEONS STRL SZ 6.5 (GLOVE) ×1
GLOVE BIOGEL PI IND STRL 7.0 (GLOVE) IMPLANT
GLOVE BIOGEL PI IND STRL 8 (GLOVE) IMPLANT
GLOVE BIOGEL PI INDICATOR 7.0 (GLOVE) ×2
GLOVE BIOGEL PI INDICATOR 8 (GLOVE) ×2
GLOVE ECLIPSE 8.0 STRL XLNG CF (GLOVE) ×3 IMPLANT
GLOVE SS BIOGEL STRL SZ 6.5 (GLOVE) IMPLANT
GLOVE SUPERSENSE BIOGEL SZ 6.5 (GLOVE) ×2
GOWN STRL REUS W/ TWL LRG LVL3 (GOWN DISPOSABLE) ×1 IMPLANT
GOWN STRL REUS W/ TWL XL LVL3 (GOWN DISPOSABLE) ×1 IMPLANT
GOWN STRL REUS W/TWL LRG LVL3 (GOWN DISPOSABLE) ×9
GOWN STRL REUS W/TWL XL LVL3 (GOWN DISPOSABLE) ×3
KIT BASIN OR (CUSTOM PROCEDURE TRAY) ×3 IMPLANT
KIT ROOM TURNOVER OR (KITS) ×3 IMPLANT
NDL HYPO 25GX1X1/2 BEV (NEEDLE) ×1 IMPLANT
NEEDLE HYPO 25GX1X1/2 BEV (NEEDLE) ×3 IMPLANT
NS IRRIG 1000ML POUR BTL (IV SOLUTION) ×3 IMPLANT
PAD ARMBOARD 7.5X6 YLW CONV (MISCELLANEOUS) ×6 IMPLANT
PENCIL BUTTON HOLSTER BLD 10FT (ELECTRODE) ×3 IMPLANT
PROBE NERVBE PRASS .33 (MISCELLANEOUS) ×2 IMPLANT
SPECIMEN JAR SMALL (MISCELLANEOUS) ×2 IMPLANT
SPONGE GAUZE 4X4 12PLY STER LF (GAUZE/BANDAGES/DRESSINGS) ×2 IMPLANT
SPONGE INTESTINAL PEANUT (DISPOSABLE) ×2 IMPLANT
STAPLER VISISTAT 35W (STAPLE) ×3 IMPLANT
SUT CHROMIC 4 0 PS 2 18 (SUTURE) ×6 IMPLANT
SUT ETHILON 3 0 PS 1 (SUTURE) ×3 IMPLANT
SUT ETHILON 5 0 PS 2 18 (SUTURE) ×1 IMPLANT
SUT SILK 0 TIES 10X30 (SUTURE) ×3 IMPLANT
SUT SILK 2 0 SH CR/8 (SUTURE) ×3 IMPLANT
SUT SILK 3 0 (SUTURE) ×3
SUT SILK 3 0 REEL (SUTURE) ×2 IMPLANT
SUT SILK 3-0 18XBRD TIE 12 (SUTURE) ×1 IMPLANT
SUT SILK 4 0 (SUTURE) ×3
SUT SILK 4 0 REEL (SUTURE) ×2 IMPLANT
SUT SILK 4-0 18XBRD TIE 12 (SUTURE) ×1 IMPLANT
TOWEL OR 17X24 6PK STRL BLUE (TOWEL DISPOSABLE) ×3 IMPLANT
TOWEL OR 17X26 10 PK STRL BLUE (TOWEL DISPOSABLE) ×3 IMPLANT
TRAY ENT MC OR (CUSTOM PROCEDURE TRAY) ×3 IMPLANT
TRAY FOLEY CATH 14FRSI W/METER (CATHETERS) IMPLANT
WATER STERILE IRR 1000ML POUR (IV SOLUTION) IMPLANT

## 2014-03-01 NOTE — Progress Notes (Signed)
Report given to Shirlean Mylar RN as new primary care giver

## 2014-03-01 NOTE — Anesthesia Preprocedure Evaluation (Addendum)
Anesthesia Evaluation  Patient identified by MRN, date of birth, ID band Patient awake    Reviewed: Allergy & Precautions, H&P , NPO status , Patient's Chart, lab work & pertinent test results  History of Anesthesia Complications Negative for: history of anesthetic complications  Airway Mallampati: II TM Distance: >3 FB Neck ROM: Full    Dental  (+) Edentulous Upper, Partial Lower, Dental Advisory Given   Pulmonary neg pulmonary ROS,    Pulmonary exam normal       Cardiovascular hypertension, Pt. on home beta blockers and Pt. on medications     Neuro/Psych negative neurological ROS  negative psych ROS   GI/Hepatic Neg liver ROS, GERD-  Medicated,  Endo/Other  negative endocrine ROS  Renal/GU negative Renal ROS     Musculoskeletal   Abdominal   Peds  Hematology   Anesthesia Other Findings   Reproductive/Obstetrics                         Anesthesia Physical Anesthesia Plan  ASA: III  Anesthesia Plan: General   Post-op Pain Management:    Induction: Intravenous  Airway Management Planned: Oral ETT  Additional Equipment:   Intra-op Plan:   Post-operative Plan: Extubation in OR  Informed Consent: I have reviewed the patients History and Physical, chart, labs and discussed the procedure including the risks, benefits and alternatives for the proposed anesthesia with the patient or authorized representative who has indicated his/her understanding and acceptance.   Dental advisory given  Plan Discussed with: CRNA, Anesthesiologist and Surgeon  Anesthesia Plan Comments:        Anesthesia Quick Evaluation

## 2014-03-01 NOTE — Progress Notes (Signed)
MD at bedside with left facial droop and unable to raise eye brow, MD is aware and has no concern.

## 2014-03-01 NOTE — Progress Notes (Signed)
Dr. Tobias Alexander notified of pt's blood pressure. No new orders.

## 2014-03-01 NOTE — Anesthesia Procedure Notes (Signed)
Procedure Name: MAC Date/Time: 03/01/2014 7:52 AM Performed by: Barrington Ellison Pre-anesthesia Checklist: Patient identified, Emergency Drugs available, Suction available, Patient being monitored and Timeout performed Patient Re-evaluated:Patient Re-evaluated prior to inductionOxygen Delivery Method: Circle system utilized Preoxygenation: Pre-oxygenation with 100% oxygen Intubation Type: IV induction and Rapid sequence Laryngoscope Size: Mac and 3 Grade View: Grade I Tube type: Oral Tube size: 7.0 mm Number of attempts: 1 Airway Equipment and Method: Stylet Placement Confirmation: ETT inserted through vocal cords under direct vision,  positive ETCO2 and breath sounds checked- equal and bilateral Secured at: 20 cm Tube secured with: Tape Dental Injury: Teeth and Oropharynx as per pre-operative assessment

## 2014-03-01 NOTE — Progress Notes (Signed)
Subjective: POD#0 from left superficial parotidectomy. Pain well controlled, patient feels somewhat nausea and asked the nurse for zofran. Has some left facial weakness that is stable since PACU but Dr. Erik Obey noted the nerve was anatomically intact intraoperatively.  Objective: Vital signs in last 24 hours: Temp:  [96.6 F (35.9 C)-98.1 F (36.7 C)] 97.4 F (36.3 C) (04/30 1613) Pulse Rate:  [52-97] 60 (04/30 1613) Resp:  [8-19] 18 (04/30 1613) BP: (147-214)/(65-136) 171/65 mmHg (04/30 1613) SpO2:  [93 %-100 %] 100 % (04/30 1613) Weight:  [65.772 kg (145 lb)] 65.772 kg (145 lb) (04/30 0622)  Right facial nerve is House-Brackmann 1/6 in all divisions, left facial nerve is House-Brackmann 4-5/6 with incomplete eye closure and left lip weakness at rest, left modified Blair incision is intact with dermabond and no hematoma, neck drain is holding bulb suction with sanguinous drainage.  @LABLAST2 (wbc:2,hgb:2,hct:2,plt:2) No results found for this basename: NA, K, CL, CO2, GLUCOSE, BUN, CREATININE, CALCIUM,  in the last 72 hours  Medications:  Scheduled Meds: . ALPRAZolam  0.25 mg Oral QHS  . [START ON 03/02/2014] benazepril  20 mg Oral Daily  . clindamycin      . HYDROmorphone      . metoprolol tartrate  25 mg Oral BID  . [START ON 03/02/2014] pantoprazole  40 mg Oral Daily   Continuous Infusions: . dextrose 5 % and 0.45% NaCl 100 mL/hr at 03/01/14 1248   PRN Meds:.acetaminophen, hydrALAZINE, HYDROcodone-acetaminophen, morphine injection, ondansetron (ZOFRAN) IV, ondansetron, phenol, polyvinyl alcohol  Assessment/Plan: Stable POD#0 from left superficial parotidectomy. I reassured the patient that since the nerve is anatomically intact her facial weakness should improve over the next few days to weeks. In the meantime can start eye precautions (nightime taping, artificial tears, and Lacrilube) monitor drain output, meds for pain and nausea and hypertension as needed.   LOS: 0 days    Ruby Cola 03/01/2014, 5:06 PM

## 2014-03-01 NOTE — Transfer of Care (Signed)
Immediate Anesthesia Transfer of Care Note  Patient: Karla Chavez  Procedure(s) Performed: Procedure(s): LEFT SUPERFICIAL  VS TOTAL PAROTIDECTOMY (Left)  Patient Location: PACU  Anesthesia Type:General  Level of Consciousness: awake, alert  and oriented  Airway & Oxygen Therapy: Patient Spontanous Breathing and Patient connected to nasal cannula oxygen  Post-op Assessment: Report given to PACU RN and Patient moving all extremities X 4  Post vital signs: Reviewed and stable  Complications: No apparent anesthesia complications

## 2014-03-01 NOTE — H&P (View-Only) (Signed)
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  3 days ago, she had sudden onset of oozing this including vertigo, worse in the supine position.  No loss of consciousness.  She went to the emergency room.  Her blood pressure was extremely elevated.  She had a CT scan of the head which was negative for brain tumor or stroke, but did reportedly demonstrate a 2 cm parotid mass on the LEFT.  I do not have these films to review at present.  She was unaware of this mass.  No trismus.  No facial weakness.  No swelling with meals.  For the dizziness, she was given meclizine which made her extremely dry but not sleepy, and a little bit of Valium.  3 week return visit for preoperative evaluation.  She has a LEFT parotid mass with an equivocal ultrasound-guided needle aspiration.  It is not Warthin's tumor.  She is quite apprehensive about the possibility of facial weakness.  I explained this was my priority also, but I did think this needed to be removed.     I discussed the surgery in detail including risks and complications.  Questions were answered and informed consent was obtained.  She has issues with hypertension and reflux.  I talked with Dr. Fred Wilson who did not feel like there were any contraindications to anesthesia and surgery.  I will talk with Dr. Peter Jordan, her cardiologist, also.  I discussed her hospital stay, and postoperative recuperation.  Vicodin for pain relief.  Return visit here 10 days   PMH: Past Medical History  Diagnosis Date  . Hypertension   . Hyperlipidemia   . Indigestion   . GERD (gastroesophageal reflux disease)   . Palpitations   . Near syncope   . Family history of anesthesia complication     pts sister has severe nausea and vomiting  . Pneumonia     hx of  . Vertigo     hx of occured on 01/01/14  . Arthritis      Surg Hx: Past Surgical History  Procedure Laterality Date  . Tubal ligation    . Panendoscopy    . Cardiovascular stress test  01/09/2008    EF 82%  . Colonoscopy    . Appendectomy      FHx:   Family History  Problem Relation Age of Onset  . Hypertension Mother   . Stroke Mother   . Heart disease Father    SocHx:  reports that she has never smoked. She does not have any smokeless tobacco history on file. She reports that she does not drink alcohol or use illicit drugs.  ALLERGIES:  Allergies  Allergen Reactions  . Amoxicillin     Yeast infection     (Not in a hospital admission)  No results found for this or any previous visit (from the past 48 hour(s)). Ct Chest W Contrast  02/27/2014   CLINICAL DATA:  Left lung opacity on preop chest x-ray.  EXAM: CT CHEST WITH CONTRAST  TECHNIQUE: Multidetector CT imaging of the chest was performed during intravenous contrast administration.  CONTRAST:  75mL OMNIPAQUE IOHEXOL 300 MG/ML  SOLN  COMPARISON:  Chest x-ray 02/20/2014.  Chest CT 06/25/2009.  FINDINGS: There is an area of linear density in the left lower lobe extending from the left lung base to the superior segment.   Along the superior and posterior aspect of this linear density is an area of mixed solid and sub solid nodularity. The solid component measures 2.3 x 1.6 cm, with larger surrounding sub solid components. This correlates with the density seen on chest x-ray.  No other suspicious nodular areas. No pleural effusions. Mild hyperinflation of the lungs. Mild ectasia of the ascending thoracic aorta measuring 3.7 cm maximally near the aortic root. No evidence of aortic aneurysm or dissection. Scattered coronary artery calcifications in the left anterior descending coronary artery.  No mediastinal, hilar, or axillary adenopathy. Chest wall soft tissues are unremarkable. Imaging into the upper abdomen shows no acute findings.  No acute bony abnormality or focal bone lesion.   IMPRESSION: Linear area of scarring in the left lower lobe with a more nodular solid and sub solid component superiorly, corresponding to the density seen on chest x-ray. This may simply reflect an area of nodular scarring, but followup CT in 3-6 months or further characterization with PET CT is recommended.  Coronary artery calcifications.   Electronically Signed   By: Rolm Baptise M.D.   On: 02/27/2014 17:04    GYI:RSWNIOEV: Feeling tired (fatigue).  No fever, no night sweats, and no recent weight loss. Head: No headache. Eyes: No eye symptoms. Otolaryngeal: No hearing loss, no earache, no tinnitus, and no purulent nasal discharge.  No nasal passage blockage (stuffiness).  Snoring  and sneezing.  No hoarseness  and no sore throat. Cardiovascular: No chest pain or discomfort.  Palpitations. Pulmonary: No dyspnea, no cough, and no wheezing. Gastrointestinal: No dysphagia.  Heartburn.  No nausea, no abdominal pain, and no melena.  No diarrhea. Genitourinary: No dysuria. Endocrine: Muscle weakness. Musculoskeletal: No calf muscle cramps, no arthralgias, and no soft tissue swelling. Neurological: Dizziness.  No fainting.  Tingling  and numbness. Psychological: Anxiety  and depression. Skin: No rash.  There were no vitals taken for this visit.  PHYSICAL EXAM: She is trim, cheerful, and energetic.  Mental status is sharp.  She hears well in conversational speech.  Voice is clear and respirations unlabored through the nose.  The head is atraumatic and neck supple.  Cranial nerves intact including facial nerve on both sides.  Ear canals are clear with normal drums.  Anterior nose is moist with a slight rightward septal deviation.  Oral cavity is moist with a full upper and partial lower plate.  She has 1+ tonsils with no visible cryptic debris.  Neck examination is remarkable for a subtle fullness behind the angle of the mandible in the LEFT parotid.  No other neck adenopathy.  No thyromegaly.  Lungs:  Clear to auscultation Heart: Slow rate and rhythm without murmurs Abdomen: Soft, active Extremities: Normal configuration Neurologic: Symmetric, grossly intact.  Facial nerve is entirely intact both sides.  Spinal accessory nerve intact on the LEFT.  Studies Reviewed:  MRI neck.  FNA cytology x 2, CT chest.    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  You have a tumor in your LEFT parotid gland.  It is probably not cancer.  I do recommend that we remove it.  This will involve dissecting either above, or below, or both the LEFT facial nerve.  The surgery will take 2-3 hours probably.  you will stay overnight in the hospital one night.  I would like you to buy some Hibiclens (chlorhexidine) liquid surgical scrub soap and shower with this the evening before surgery and the morning of.  Hydrocodone is for  pain relief.  I will see you here in the office 10 days after surgery.  Hydrocodone-Acetaminophen 5-325 MG Oral Tablet;1-2 po q4-6h prn pain; Qty40; R0;   Cherisse Carrell 02/28/2014, 7:55 PM     

## 2014-03-01 NOTE — Interval H&P Note (Signed)
Interval H&P completed. 

## 2014-03-01 NOTE — Op Note (Signed)
DATE:  30 APR 15  TIME:  10:15    Patient Name:  Karla Chavez, Karla Chavez  MRN: [id number]   Pre-Op Dx:   Uncertain behaving neoplasm, left parotid  Post-op Dx: Same  Proc: Left superficial parotidectomy with facial nerve preservation   Surg:  Jodi Marble T MD  Asst:  Sallee Provencal, PA  Anes:  GOT  EBL: Minimal  Comp:  None  Findings:  Soft 2 cm apparent neoplasm in the superior superficial left parotid lobe. Facial nerve identified and preserved.  Procedure:   With the patient in a comfortable supine position, general orotracheal anesthesia was induced without difficulty. The head was rotated to the right for access to the left neck. A shoulder roll was applied.  the patient was placed in reversed Trendelenburg.  The NIMS monitor electrodes were applied in the standard fashion. A surgical incision was marked and then infiltrated with 9 cc 1% Xylocaine with 1 100,000 epinephrine.   A sterile preparation and draping with Hibiclens was accomplished in the standard fashion.  A crosshatch was made at the mid neck and low standard. The wound was sharply executed into the subcutaneous fat. The greater auricular nerve was identified and protected. Working on a broad front the dissection was carried down to the sternomastoid muscle, and along the cartilaginous external canal. This was carried forward until the digastric muscle was identified. Vessels near the stylomastoid foramen were controlled with silk ligature. The facial nerve was identified.  Using moist sponges, the dissection was carried out on the main trunk, and then along the superior and inferior trunks. The tumor was difficult to palpate. A complete superficial parotidectomy was carried out by dissecting branches to the periphery. One communicating branch between the superior and inferior branches was sacrificed. Hemostasis was achieved with bipolar cautery and with silk ligature. The Shaw scalpel was used for hemostatic dissection  throughout the procedure.  Upon removing the superficial lobe, it was sectioned off the field and the tumor was identified. This was passed off as specimen for permanent interpretation.  Valsalva was used and no additional bleeding was noted. The wound was thoroughly irrigated.  A 10 French Jackson-Pratt drain was brought out through a separate stab incision and laid into the parotid bed. This was secured at the skin with a 3-0 nylon stitch.  Tieback sutures were removed. The flap was laid back down. It was observed to be viable. The wound was closed with interrupted 4-0 chromic sutures finally Dermabond at the skin. Hemostasis was observed. The drain was functional.  At this point the patient was returned to anesthesia, awakened, extubated, and transferred to recovery in stable condition.    Dispo:  PACU to 23 hour observation.  Plan:  Ice, elevation, analgesia, 23 hour suction drainage.  Tyson Alias MD

## 2014-03-01 NOTE — Anesthesia Postprocedure Evaluation (Signed)
Anesthesia Post Note  Patient: Karla Chavez  Procedure(s) Performed: Procedure(s) (LRB): LEFT SUPERFICIAL  VS TOTAL PAROTIDECTOMY (Left)  Anesthesia type: general  Patient location: PACU  Post pain: Pain level controlled  Post assessment: Patient's Cardiovascular Status Stable  Last Vitals:  Filed Vitals:   03/01/14 1232  BP: 172/65  Pulse: 97  Temp: 35.9 C  Resp: 18    Post vital signs: Reviewed and stable  Level of consciousness: sedated  Complications: No apparent anesthesia complications

## 2014-03-02 ENCOUNTER — Encounter (HOSPITAL_COMMUNITY): Payer: Self-pay | Admitting: Otolaryngology

## 2014-03-02 NOTE — Discharge Instructions (Signed)
Parotidectomy Care After Refer to this sheet in the next few weeks. These instructions provide you with information on caring for yourself after your procedure. Your caregiver may also give you more specific instructions. Your treatment has been planned according to current medical practices, but problems sometimes occur. Call your caregiver if you have any problems or questions after your procedure. HOME CARE INSTRUCTIONS Wound care  Keep your incision clean after the dressing is off.  Wash it with mild soap and water. Pat it dry with a clean towel. Do not rub the incision..  Check your incision every day to make sure that it is not red..  You may shower beginning Friday Pain  Some pain is normal after a parotidectomy. Take whatever pain medicine your surgeon prescribes. Follow the directions carefully.  Do not take over-the-counter pain medicine unless approved by your caregiver.Some painkillers, like aspirin, can cause bleeding. Diet  You can eat like you normally do once you are home. However, it might hurt to chew for a while. Stay away from foods that are hard to chew.  It may help to take your pain medicine about 30 minutes before you eat. Other precautions  Keep your head propped up when you lie down. Try using 2 pillows to do this. Do it for about 4-5 days after your surgery.  You can probably go back to your normal routine after a few days.However, do not do anything that requires great effort until your surgeon says it is okay.  No strenuous activity x 2 weeks. SEEK MEDICAL CARE IF:  You have any questions about your medicines.  Pain does not go away, even after taking pain medicine.  You vomit or feel nauseous. SEEK IMMEDIATE MEDICAL CARE IF:   You are taking pain medicine but your pain gets much worse.  Yourincision looks red and swollen or blood or fluid leaks from the wound.  Skin on your ear or face gets red and swollen.  Your face is numb or feels  weak.  You have a fever. MAKE SURE YOU:  Understand these instructions.  Will watch your condition.  Will get help right away if you are not doing well or get worse. Document Released: 11/21/2010 Document Revised: 01/11/2012 Document Reviewed: 11/21/2010 [facility] Patient Information 2014 [place], [place].  Use artificial tears in LEFT eye as often as desired for irritation.  Tape the LEFT eye shut at night.  You could use some of the eye ointment before applying the tape as you desire.  When the eye closes well by itself, you can stop using the tape at night.  Be careful in windy/dusty situation because the LEFT eye does not blink very well right now.

## 2014-03-02 NOTE — Discharge Summary (Signed)
03/02/2014 9:18 AM  Karla Chavez [id number]  Post-Op Day 1    Temp:  [96.6 F (35.9 C)-98.6 F (37 C)] 98.6 F (37 C) (05/01 0539) Pulse Rate:  [54-97] 63 (05/01 0539) Resp:  [8-19] 16 (05/01 0539) BP: (143-179)/(56-136) 143/56 mmHg (05/01 0539) SpO2:  [93 %-100 %] 98 % (05/01 0539) Weight:  [144 lb 15.9 oz (65.77 kg)] 144 lb 15.9 oz (65.77 kg) (05/01 0100),     Intake/Output Summary (Last 24 hours) at 03/02/14 0918 Last data filed at 03/02/14 0704  Gross per 24 hour  Intake   3280 ml  Output   2872 ml  Net    408 ml   JP drain 22 ml  No results found for this or any previous visit (from the past 24 hour(s)).  SUBJECTIVE:  Min pain.  Breathing well.  Eating and drinking.  Spontaneous voiding  OBJECTIVE:    Minimal facial motion LEFT, with poor eye closing. Fairly poor facial tone.   Wound flat.  Drain functioning and removed.    IMPRESSION:  Satisfactory check with LEFT facial weakness  PLAN:    Discharge home.    Admit:  30 APR Discharge:  1MAY Final Diagnosis:   LEFT parotid neoplasm. Proc:  LEFT superficial parotidectomy with facial n preservation, 30 APR Comp:  LEFT facial weakness Cond:  Ambulatory. Pain controlled.  Taking po's.  Drain removed. Recheck:  2 weeks my office Instructions written and given  Hosp Course:  Underwent superficial LEFT parotidectomy on day of admission.  Had immediate post op facial weakness despite anatomically intact nerve at surgery.  Min pain.  No SOB.  Advanced diet and activity.  Minimal drainage.  Drain removed and discharged to home and care of family on POD 1.     Ileene Hutchinson [facility]

## 2014-03-02 NOTE — Progress Notes (Signed)
UR Completed.  Maggie Dworkin Jane Mylinh Cragg [phone] 03/02/2014  

## 2014-03-02 NOTE — Progress Notes (Signed)
Discharge home. Home discharge instruction given, no questions verbalized. 

## 2014-04-03 ENCOUNTER — Ambulatory Visit: Payer: Medicare Other | Admitting: Cardiology

## 2014-09-07 ENCOUNTER — Other Ambulatory Visit: Payer: Self-pay

## 2014-09-07 DIAGNOSIS — Z1231 Encounter for screening mammogram for malignant neoplasm of breast: Secondary | ICD-10-CM

## 2014-09-17 ENCOUNTER — Other Ambulatory Visit: Payer: Self-pay | Admitting: Otolaryngology

## 2014-09-17 DIAGNOSIS — R9389 Abnormal findings on diagnostic imaging of other specified body structures: Secondary | ICD-10-CM

## 2014-09-26 ENCOUNTER — Ambulatory Visit: Payer: Medicare Other

## 2014-10-02 ENCOUNTER — Ambulatory Visit
Admission: RE | Admit: 2014-10-02 | Discharge: 2014-10-02 | Disposition: A | Discharge status: Home / Self Care | Payer: Medicare Other | Source: Ambulatory Visit

## 2014-10-02 ENCOUNTER — Ambulatory Visit
Admission: RE | Admit: 2014-10-02 | Discharge: 2014-10-02 | Disposition: A | Discharge status: Home / Self Care | Payer: Medicare Other | Source: Ambulatory Visit | Attending: Otolaryngology | Admitting: Otolaryngology

## 2014-10-02 DIAGNOSIS — R9389 Abnormal findings on diagnostic imaging of other specified body structures: Secondary | ICD-10-CM

## 2014-10-02 DIAGNOSIS — Z1231 Encounter for screening mammogram for malignant neoplasm of breast: Secondary | ICD-10-CM

## 2014-12-27 ENCOUNTER — Other Ambulatory Visit: Payer: Self-pay | Admitting: Physician Assistant

## 2015-01-26 ENCOUNTER — Other Ambulatory Visit: Payer: Self-pay | Admitting: Cardiology

## 2015-02-05 ENCOUNTER — Encounter: Payer: Self-pay | Admitting: Gastroenterology

## 2015-02-08 ENCOUNTER — Other Ambulatory Visit: Payer: Self-pay | Admitting: Cardiology

## 2015-03-02 ENCOUNTER — Other Ambulatory Visit: Payer: Self-pay | Admitting: Cardiology

## 2015-03-06 ENCOUNTER — Other Ambulatory Visit: Payer: Self-pay

## 2015-03-06 MED ORDER — BENAZEPRIL HCL 20 MG PO TABS: 20.0000 mg | ORAL_TABLET | Freq: Every day | ORAL | Status: DC

## 2015-03-07 ENCOUNTER — Other Ambulatory Visit: Payer: Self-pay | Admitting: Cardiology

## 2015-03-16 ENCOUNTER — Other Ambulatory Visit: Payer: Self-pay | Admitting: Physician Assistant

## 2015-04-02 ENCOUNTER — Other Ambulatory Visit: Payer: Self-pay | Admitting: Cardiology

## 2015-04-25 ENCOUNTER — Other Ambulatory Visit: Payer: Self-pay | Admitting: Cardiology

## 2015-05-29 ENCOUNTER — Other Ambulatory Visit: Payer: Self-pay | Admitting: Cardiology

## 2015-05-29 NOTE — Telephone Encounter (Signed)
REFILL 

## 2015-06-13 ENCOUNTER — Encounter: Payer: Self-pay | Admitting: Cardiology

## 2015-06-13 ENCOUNTER — Ambulatory Visit (INDEPENDENT_AMBULATORY_CARE_PROVIDER_SITE_OTHER): Payer: Medicare Other | Admitting: Cardiology

## 2015-06-13 VITALS — BP 132/84 | HR 50 | Ht 65.0 in | Wt 148.0 lb

## 2015-06-13 DIAGNOSIS — R002 Palpitations: Secondary | ICD-10-CM | POA: Diagnosis not present

## 2015-06-13 DIAGNOSIS — I491 Atrial premature depolarization: Secondary | ICD-10-CM

## 2015-06-13 DIAGNOSIS — R42 Dizziness and giddiness: Secondary | ICD-10-CM

## 2015-06-13 DIAGNOSIS — I2510 Atherosclerotic heart disease of native coronary artery without angina pectoris: Secondary | ICD-10-CM | POA: Diagnosis not present

## 2015-06-13 DIAGNOSIS — R55 Syncope and collapse: Secondary | ICD-10-CM | POA: Diagnosis not present

## 2015-06-13 DIAGNOSIS — E785 Hyperlipidemia, unspecified: Secondary | ICD-10-CM

## 2015-06-13 DIAGNOSIS — I10 Essential (primary) hypertension: Secondary | ICD-10-CM

## 2015-06-13 LAB — LIPID PANEL
CHOL/HDL RATIO: 2.9 ratio (ref <=5.0)
Cholesterol: 179 mg/dL (ref 125–200)
HDL: 61 mg/dL (ref >=46)
LDL Cholesterol: 95 mg/dL (ref <130)
TRIGLYCERIDES: 115 mg/dL (ref <150)
VLDL: 23 mg/dL (ref <30)

## 2015-06-13 LAB — HEPATIC FUNCTION PANEL
ALT: 13 U/L (ref 6–29)
AST: 14 U/L (ref 10–35)
Albumin: 4.3 g/dL (ref 3.6–5.1)
Alkaline Phosphatase: 80 U/L (ref 33–130)
BILIRUBIN DIRECT: 0.1 mg/dL (ref <=0.2)
Indirect Bilirubin: 0.7 mg/dL (ref 0.2–1.2)
TOTAL PROTEIN: 6.6 g/dL (ref 6.1–8.1)
Total Bilirubin: 0.8 mg/dL (ref 0.2–1.2)

## 2015-06-13 LAB — BASIC METABOLIC PANEL
BUN: 18 mg/dL (ref 7–25)
CALCIUM: 9.1 mg/dL (ref 8.6–10.4)
CO2: 26 mmol/L (ref 20–31)
CREATININE: 0.94 mg/dL — AB (ref 0.60–0.93)
Chloride: 107 mmol/L (ref 98–110)
GLUCOSE: 87 mg/dL (ref 65–99)
Potassium: 4.2 mmol/L (ref 3.5–5.3)
Sodium: 143 mmol/L (ref 135–146)

## 2015-06-13 MED ORDER — ASPIRIN EC 81 MG PO TBEC: 81.0000 mg | DELAYED_RELEASE_TABLET | Freq: Every day | ORAL | Status: DC

## 2015-06-13 NOTE — Progress Notes (Signed)
Karla Chavez Date of Birth: 1935/02/14   History of Present Illness: Karla Chavez is seen for  followup of hypertension. She is 80 yo. She has a history of HTN, hyperlipidemia, and family history of CAD.  She had a normal Myoview study in 2009 and a normal Echo in 2012. She has a history of palpitations related to PACs.  She has a history of taking her medications as she sees fit. She reports she has not been taking amlodipine because her BP was getting too low. Does report compliance with metoprolol and benazepril. On lipitor. Denies any chest pain or dyspnea. Does complain of anxiety and some imbalance. She had parotid tumor removed surgically last year. CT showed a small LL nodule that was stable in follow up. CT did show coronary calcification.  Current Outpatient Prescriptions on File Prior to Visit  Medication Sig Dispense Refill  . ALPRAZolam (XANAX) 0.25 MG tablet Take 0.25 mg by mouth at bedtime.    Marland Kitchen atorvastatin (LIPITOR) 40 MG tablet TAKE 1 TABLET BY MOUTH EVERY DAY 30 tablet 3  . benazepril (LOTENSIN) 20 MG tablet TAKE 1 TABLET(20 MG) BY MOUTH DAILY 90 tablet 0  . metoprolol tartrate (LOPRESSOR) 25 MG tablet Take 1 tablet (25 mg total) by mouth 2 (two) times daily. 180 tablet 0  . pantoprazole (PROTONIX) 40 MG tablet TAKE 1 TABLET BY MOUTH EVERY DAY 90 tablet 0  . Prenatal Vit-Fe Sulfate-FA (PRENATAL VITAMIN PO) Take 1 tablet by mouth daily.     No current facility-administered medications on file prior to visit.    Allergies  Allergen Reactions  . Amoxicillin     Yeast infection    Past Medical History  Diagnosis Date  . Hypertension   . Hyperlipidemia   . GERD (gastroesophageal reflux disease)   . Palpitations   . Near syncope   . Family history of anesthesia complication     pts sister has severe nausea and vomiting  . Vertigo     hx of occured on 01/01/14  . History of blood transfusion 1958    "related to childbirth"  . Arthritis     "joints" (03/01/2014)   . Pneumonia 06/2009  . PONV (postoperative nausea and vomiting)     "this OR" (03/01/2014)    Past Surgical History  Procedure Laterality Date  . Panendoscopy    . Cardiovascular stress test  01/09/2008    EF 82%  . Colonoscopy    . Parotidectomy Left 03/01/2104    superficial with facial nerve preservation   . Appendectomy  1958  . Cervical cone biopsy  1970's  . Tubal ligation  1972  . Parotidectomy Left 03/01/2014    Procedure: LEFT SUPERFICIAL  VS TOTAL PAROTIDECTOMY;  Surgeon: Jodi Marble, MD;  Location: [facility];  Service: ENT;  Laterality: Left;    History  Smoking status  . Never Smoker   Smokeless tobacco  . Never Used    History  Alcohol Use No    Family History  Problem Relation Age of Onset  . Hypertension Mother   . Stroke Mother 14  . Heart disease Father 45    massive MI  . Heart disease Sister 71    CABG  . Heart disease Brother 31    CABG    Review of Systems: As noted in history of present illness.  All other systems were reviewed and are negative.  Physical Exam: BP 132/84 mmHg  Pulse 50  Ht 5\' 5"  (1.651 m)  Wt 67.132 kg (148 lb)  BMI 24.63 kg/m2 She is a pleasant white female in no acute distress. She is normocephalic, atraumatic. Pupils are round and reactive to light and accommodation. Extraocular movements are full. Oropharynx is clear. Neck is supple without JVD, adenopathy, thyromegaly, or bruits. Lungs are clear. Cardiac exam reveals a regular rate and rhythm without gallop, murmur, or click. Abdomen is soft and nontender. She has no masses or bruits. Pedal pulses are good. She has no edema. She is alert oriented x3. Cranial nerves II through XII are intact. Gait is normal.  LABORATORY DATA: ECG today shows NSR with occ. PAC, LAD. Otherwise normal. I have personally reviewed and interpreted this study.   Assessment / Plan: 1. Hypertension, well controlled. Continue metoprolol and benazepril.  2. Hypercholesterolemia. She has severe  familial hypercholesterolemia with prior LDL cholesterol 224. Now on  atorvastatin 40 mg daily. Will check fasting lab work today.  3. Palpitations. She has a history of PACs and short burst of PACs on previous monitor. We will continue with metoprolol.  4. Coronary calcification noted on CT. I reviewed personally and she does have heavy calcification in the LAD territory. Recommend she take ASA 81 mg daily. Will schedule for a stress Myoview to evaluate risk of ischemia.

## 2015-06-13 NOTE — Patient Instructions (Addendum)
We will schedule you for a nuclear stress test  Continue your current medication  We will check fasting lab work today.  Take ASA 81 mg daily

## 2015-06-16 ENCOUNTER — Other Ambulatory Visit: Payer: Self-pay | Admitting: Physician Assistant

## 2015-06-25 ENCOUNTER — Encounter: Payer: Self-pay | Admitting: Gastroenterology

## 2015-06-27 ENCOUNTER — Telehealth (HOSPITAL_COMMUNITY): Payer: Self-pay

## 2015-06-27 NOTE — Telephone Encounter (Signed)
Encounter complete. 

## 2015-06-28 ENCOUNTER — Telehealth (HOSPITAL_COMMUNITY): Payer: Self-pay

## 2015-06-28 NOTE — Telephone Encounter (Signed)
Encounter complete. 

## 2015-07-02 ENCOUNTER — Ambulatory Visit (HOSPITAL_COMMUNITY)
Admission: RE | Admit: 2015-07-02 | Discharge: 2015-07-02 | Disposition: A | Discharge status: Home / Self Care | Payer: Medicare Other | Source: Ambulatory Visit | Attending: Cardiology | Admitting: Cardiology

## 2015-07-02 ENCOUNTER — Encounter (HOSPITAL_COMMUNITY): Payer: Self-pay | Admitting: *Deleted

## 2015-07-02 DIAGNOSIS — Z8249 Family history of ischemic heart disease and other diseases of the circulatory system: Secondary | ICD-10-CM | POA: Insufficient documentation

## 2015-07-02 DIAGNOSIS — I10 Essential (primary) hypertension: Secondary | ICD-10-CM

## 2015-07-02 DIAGNOSIS — I491 Atrial premature depolarization: Secondary | ICD-10-CM | POA: Diagnosis not present

## 2015-07-02 DIAGNOSIS — I2510 Atherosclerotic heart disease of native coronary artery without angina pectoris: Secondary | ICD-10-CM

## 2015-07-02 DIAGNOSIS — R002 Palpitations: Secondary | ICD-10-CM | POA: Diagnosis not present

## 2015-07-02 DIAGNOSIS — E785 Hyperlipidemia, unspecified: Secondary | ICD-10-CM | POA: Diagnosis not present

## 2015-07-02 DIAGNOSIS — R42 Dizziness and giddiness: Secondary | ICD-10-CM | POA: Diagnosis not present

## 2015-07-02 DIAGNOSIS — R55 Syncope and collapse: Secondary | ICD-10-CM | POA: Diagnosis not present

## 2015-07-02 LAB — MYOCARDIAL PERFUSION IMAGING
CHL CUP NUCLEAR SRS: 2
CHL CUP NUCLEAR SSS: 6
LV dias vol: 57 mL
LV sys vol: 18 mL
Peak HR: 113 {beats}/min
Rest HR: 71 {beats}/min
SDS: 4
TID: 1.03

## 2015-07-02 MED ORDER — REGADENOSON 0.4 MG/5ML IV SOLN
0.4000 mg | Freq: Once | INTRAVENOUS | Status: AC
  Administered 2015-07-02: 0.4 mg via INTRAVENOUS

## 2015-07-02 MED ORDER — TECHNETIUM TC 99M SESTAMIBI GENERIC - CARDIOLITE
10.1000 | Freq: Once | INTRAVENOUS | Status: AC | PRN
  Administered 2015-07-02: 10.1 via INTRAVENOUS

## 2015-07-02 MED ORDER — TECHNETIUM TC 99M SESTAMIBI GENERIC - CARDIOLITE
29.6000 | Freq: Once | INTRAVENOUS | Status: AC | PRN
  Administered 2015-07-02: 30 via INTRAVENOUS

## 2015-07-02 MED ORDER — AMINOPHYLLINE 25 MG/ML IV SOLN
75.0000 mg | Freq: Once | INTRAVENOUS | Status: AC
  Administered 2015-07-02: 75 mg via INTRAVENOUS

## 2015-07-02 NOTE — Progress Notes (Unsigned)
Patient's blood pressure was 190/107 & 188/107, was changed to a [employer]

## 2015-07-12 ENCOUNTER — Other Ambulatory Visit: Payer: Self-pay | Admitting: Cardiology

## 2015-09-09 ENCOUNTER — Other Ambulatory Visit: Payer: Self-pay | Admitting: Cardiology

## 2015-10-02 ENCOUNTER — Other Ambulatory Visit: Payer: Self-pay

## 2015-10-02 MED ORDER — METOPROLOL TARTRATE 25 MG PO TABS: 25.0000 mg | ORAL_TABLET | Freq: Two times a day (BID) | ORAL | Status: DC

## 2015-10-21 ENCOUNTER — Other Ambulatory Visit: Payer: Self-pay | Admitting: Family Medicine

## 2015-10-21 DIAGNOSIS — R9389 Abnormal findings on diagnostic imaging of other specified body structures: Secondary | ICD-10-CM

## 2015-11-18 ENCOUNTER — Other Ambulatory Visit: Payer: Medicare Other

## 2015-11-18 ENCOUNTER — Inpatient Hospital Stay: Admission: RE | Admit: 2015-11-18 | Payer: Medicare Other | Source: Ambulatory Visit

## 2015-12-02 ENCOUNTER — Ambulatory Visit
Admission: RE | Admit: 2015-12-02 | Discharge: 2015-12-02 | Disposition: A | Discharge status: Home / Self Care | Payer: Medicare Other | Source: Ambulatory Visit | Attending: Family Medicine | Admitting: Family Medicine

## 2015-12-02 DIAGNOSIS — R9389 Abnormal findings on diagnostic imaging of other specified body structures: Secondary | ICD-10-CM

## 2015-12-02 MED ORDER — IOPAMIDOL (ISOVUE-370) INJECTION 76%
100.0000 mL | Freq: Once | INTRAVENOUS | Status: AC | PRN
  Administered 2015-12-02: 100 mL via INTRAVENOUS

## 2015-12-03 ENCOUNTER — Inpatient Hospital Stay: Admission: RE | Admit: 2015-12-03 | Payer: Medicare Other | Source: Ambulatory Visit

## 2015-12-03 ENCOUNTER — Other Ambulatory Visit: Payer: Medicare Other

## 2015-12-24 DIAGNOSIS — G4700 Insomnia, unspecified: Secondary | ICD-10-CM | POA: Insufficient documentation

## 2015-12-24 DIAGNOSIS — I719 Aortic aneurysm of unspecified site, without rupture: Secondary | ICD-10-CM | POA: Insufficient documentation

## 2016-01-01 ENCOUNTER — Encounter: Payer: Self-pay | Admitting: Gastroenterology

## 2016-02-27 ENCOUNTER — Encounter: Payer: Self-pay | Admitting: Gastroenterology

## 2016-02-27 ENCOUNTER — Ambulatory Visit (INDEPENDENT_AMBULATORY_CARE_PROVIDER_SITE_OTHER): Payer: Medicare Other | Admitting: Gastroenterology

## 2016-02-27 VITALS — BP 130/80 | HR 60 | Ht 67.0 in | Wt 143.0 lb

## 2016-02-27 DIAGNOSIS — Z86010 Personal history of colonic polyps: Secondary | ICD-10-CM

## 2016-02-27 DIAGNOSIS — K219 Gastro-esophageal reflux disease without esophagitis: Secondary | ICD-10-CM | POA: Diagnosis not present

## 2016-02-27 MED ORDER — NA SULFATE-K SULFATE-MG SULF 17.5-3.13-1.6 GM/177ML PO SOLN: 1.0000 | Freq: Once | ORAL | Status: DC

## 2016-02-27 NOTE — Patient Instructions (Addendum)
It has been recommended to you by your physician that you have a(n) Colonoscopy completed. Per your request, we did not schedule the procedure(s) today. Please contact our office at [phone] when you decide to schedule.   Thank you for choosing me and [facility].  Pricilla Riffle. Dagoberto Ligas., MD., Marval Regal

## 2016-02-27 NOTE — Progress Notes (Signed)
    History of Present Illness: This is a 81 year old female with GERD and a history of adenomatous colon polyps. She was having frequent GERD symptoms for a few months following her husbands death however her GERD is now under very good control after starting Celexa. Colonoscopy in 03/2010 showed 1 adenomatous colon polyp and internal hemorrhoids. EGD in 03/2010 was normal. Denies weight loss, abdominal pain, constipation, diarrhea, change in stool caliber, melena, hematochezia, nausea, vomiting, dysphagia, chest pain.  Current Medications, Allergies, Past Medical History, Past Surgical History, Family History and Social History were reviewed in [employer] record.  Physical Exam: General: Well developed, well nourished, no acute distress Head: Normocephalic and atraumatic Eyes:  sclerae anicteric, EOMI Ears: Normal auditory acuity Mouth: No deformity or lesions Lungs: Clear throughout to auscultation Heart: Regular rate and rhythm; no murmurs, rubs or bruits Abdomen: Soft, non tender and non distended. No masses, hepatosplenomegaly or hernias noted. Normal Bowel sounds Rectal: deferred to colonoscopy Musculoskeletal: Symmetrical with no gross deformities  Pulses:  Normal pulses noted Extremities: No clubbing, cyanosis, edema or deformities noted Neurological: Alert oriented x 4, grossly nonfocal Psychological:  Alert and cooperative. Normal mood and affect  Assessment and Recommendations:  1. Personal history of adenomatous colonoscopy. She is due for surveillance colonoscopy and would like the procedure scheduled at the time of her sisters colonoscopy. The risks (including bleeding, perforation, infection, missed lesions, medication reactions and possible hospitalization or surgery if complications occur), benefits, and alternatives to colonoscopy with possible biopsy and possible polypectomy were discussed with the patient and they consent to proceed.   2. GERD.  Continue Protonix 40 mg daily and antireflux measures. Call if symptoms flare and consider an H2RA qpm or Protonix bid.

## 2016-05-12 ENCOUNTER — Encounter: Payer: Medicare Other | Admitting: Gastroenterology

## 2016-06-15 ENCOUNTER — Telehealth: Payer: Self-pay | Admitting: Cardiology

## 2016-06-15 MED ORDER — BENAZEPRIL HCL 20 MG PO TABS: 20.0000 mg | ORAL_TABLET | Freq: Every day | ORAL | 0 refills | Status: DC

## 2016-06-15 NOTE — Telephone Encounter (Signed)
Rx(s) sent to pharmacy electronically.  

## 2016-06-15 NOTE — Telephone Encounter (Signed)
New message      *STAT* If patient is at the pharmacy, call can be transferred to refill team.   1. Which medications need to be refilled? (please list name of each medication and dose if known) benazepril 20mg   2. Which pharmacy/location (including street and city if local pharmacy) is medication to be sent to? [facility] in [place]  3. Do they need a 30 day or 90 day supply? [place]

## 2016-07-15 ENCOUNTER — Other Ambulatory Visit: Payer: Self-pay | Admitting: *Deleted

## 2016-07-15 MED ORDER — BENAZEPRIL HCL 20 MG PO TABS: 20.0000 mg | ORAL_TABLET | Freq: Every day | ORAL | 0 refills | Status: DC

## 2016-07-15 NOTE — Telephone Encounter (Signed)
benazepril (LOTENSIN) 20 MG tablet  Medication  Date: 06/15/2016 Department: [facility] Ordering/Authorizing: Peter M Martinique, MD  Order Providers   Prescribing Provider Encounter Provider  Peter M Martinique, MD Peter M Martinique, MD  Medication Detail    Disp Refills Start End   benazepril (LOTENSIN) 20 MG tablet 90 tablet 0 06/15/2016    Sig - Route: Take 1 tablet (20 mg total) by mouth daily. - Oral   E-Prescribing Status: Receipt confirmed by pharmacy (06/15/2016 9:41 AM EDT)   Pharmacy   [id number] - [place], [place] - [address]   Has meds until nove, appt is in dec, will give her 30 more days with a note to hold until needed.

## 2016-08-19 ENCOUNTER — Encounter: Payer: Self-pay | Admitting: Gastroenterology

## 2016-09-09 ENCOUNTER — Other Ambulatory Visit: Payer: Self-pay | Admitting: *Deleted

## 2016-09-09 MED ORDER — PANTOPRAZOLE SODIUM 40 MG PO TBEC: 40.0000 mg | DELAYED_RELEASE_TABLET | Freq: Every day | ORAL | 0 refills | Status: DC

## 2016-10-07 ENCOUNTER — Other Ambulatory Visit: Payer: Self-pay | Admitting: Cardiology

## 2016-10-08 ENCOUNTER — Other Ambulatory Visit: Payer: Self-pay | Admitting: *Deleted

## 2016-10-08 MED ORDER — BENAZEPRIL HCL 20 MG PO TABS
20.0000 mg | ORAL_TABLET | Freq: Every day | ORAL | 0 refills | Status: DC
Start: 2016-10-08 — End: 2016-10-09

## 2016-10-08 NOTE — Telephone Encounter (Signed)
Rx(s) sent to pharmacy electronically.  

## 2016-10-09 ENCOUNTER — Other Ambulatory Visit: Payer: Self-pay

## 2016-10-09 MED ORDER — BENAZEPRIL HCL 20 MG PO TABS: 20.0000 mg | ORAL_TABLET | Freq: Every day | ORAL | 0 refills | Status: DC

## 2016-10-10 NOTE — Progress Notes (Signed)
Karla Chavez Date of Birth: 1935/05/11   History of Present Illness: Karla Chavez is seen for  followup of hypertension. She is 81 yo. She has a history of HTN, hyperlipidemia, and family history of CAD.  She had a normal Myoview study in 2009 and a normal Echo in 2012. Reports compliance with metoprolol and benazepril. On lipitor. Denies any chest pain or dyspnea. Does complain of anxiety and some imbalance. Chewt CT did show coronary calcification. Repeat Myoview study in August 2016 was normal.  She reports problems with BP being high this summer. Started on amlodipine with improvement. PACs and balance also better. She reports she lost her husband this year and this has been stressful.  Current Outpatient Prescriptions on File Prior to Visit  Medication Sig Dispense Refill  . atorvastatin (LIPITOR) 40 MG tablet TAKE 1 TABLET BY MOUTH EVERY DAY 90 tablet 3  . benazepril (LOTENSIN) 20 MG tablet Take 1 tablet (20 mg total) by mouth daily. 90 tablet 0  . citalopram (CELEXA) 10 MG tablet Take 5 mg by mouth daily.     . metoprolol tartrate (LOPRESSOR) 25 MG tablet TAKE 1 TABLET BY MOUTH TWICE DAILY 180 tablet 1  . Prenatal Vit-Fe Sulfate-FA (PRENATAL VITAMIN PO) Take 1 tablet by mouth daily.     No current facility-administered medications on file prior to visit.     Allergies  Allergen Reactions  . Amoxicillin     Yeast infection    Past Medical History:  Diagnosis Date  . Adenomatous colon polyp 03/2010  . Arthritis    "joints" (03/01/2014)  . Family history of anesthesia complication    pts sister has severe nausea and vomiting  . GERD (gastroesophageal reflux disease)   . History of blood transfusion 1958   "related to childbirth"  . Hyperlipidemia   . Hypertension   . Near syncope   . Palpitations   . Pneumonia 06/2009  . PONV (postoperative nausea and vomiting)    "this OR" (03/01/2014)  . Vertigo    hx of occured on 01/01/14    Past Surgical History:  Procedure  Laterality Date  . APPENDECTOMY  1958  . CARDIOVASCULAR STRESS TEST  01/09/2008   EF 82%  . CERVICAL CONE BIOPSY  1970's  . COLONOSCOPY    . PANENDOSCOPY    . PAROTIDECTOMY Left 03/01/2104   superficial with facial nerve preservation   . PAROTIDECTOMY Left 03/01/2014   Procedure: LEFT SUPERFICIAL  VS TOTAL PAROTIDECTOMY;  Surgeon: Jodi Marble, MD;  Location: [place];  Service: ENT;  Laterality: Left;  . TUBAL LIGATION  1972    History  Smoking Status  . Never Smoker  Smokeless Tobacco  . Never Used    History  Alcohol Use No    Family History  Problem Relation Age of Onset  . Hypertension Mother   . Stroke Mother 60  . Heart disease Father 80    massive MI  . Heart disease Sister 34    CABG  . Heart disease Brother 68    CABG    Review of Systems: As noted in history of present illness.  All other systems were reviewed and are negative.  Physical Exam: BP 132/86   Pulse (!) 58   Ht 5\' 7"  (1.702 m)   Wt 152 lb 3.2 oz (69 kg)   LMP  (LMP Unknown)   BMI 23.84 kg/m  She is a pleasant white female in no acute distress. She is normocephalic, atraumatic. Pupils are  round and reactive to light and accommodation. Extraocular movements are full. Oropharynx is clear. Neck is supple without JVD, adenopathy, thyromegaly, or bruits. Lungs are clear. Cardiac exam reveals a regular rate and rhythm without gallop, murmur, or click. Abdomen is soft and nontender. She has no masses or bruits. Pedal pulses are good. She has no edema. She is alert oriented x3. Cranial nerves II through XII are intact. Gait is normal.  LABORATORY DATA: Lab Results  Component Value Date   WBC 6.7 02/20/2014   HGB 14.6 02/20/2014   HCT 42.7 02/20/2014   PLT 237 02/20/2014   GLUCOSE 87 06/13/2015   CHOL 179 06/13/2015   TRIG 115 06/13/2015   HDL 61 06/13/2015   LDLDIRECT 224.3 06/10/2011   LDLCALC 95 06/13/2015   ALT 13 06/13/2015   AST 14 06/13/2015   NA 143 06/13/2015   K 4.2 06/13/2015   CL  107 06/13/2015   CREATININE 0.94 (H) 06/13/2015   BUN 18 06/13/2015   CO2 26 06/13/2015   TSH 1.40 06/10/2011   INR 0.84 02/01/2014   HGBA1C  06/26/2009    5.8 (NOTE) The ADA recommends the following therapeutic goal for glycemic control related to Hgb A1c measurement: Goal of therapy: <6.5 Hgb A1c  Reference: American Diabetes Association: Clinical Practice Recommendations 2010, Diabetes Care, 2010, 33: (Suppl  1).    ECG today shows NSR with occ. PAC, LAD. Otherwise normal. I have personally reviewed and interpreted this study.  Myoview 07/02/15: Study Highlights    The left ventricular ejection fraction is hyperdynamic (>65%).  Nuclear stress EF: 68%.  There was no ST segment deviation noted during stress.  The study is normal.   Normal stress nuclear study with no ischemia or infarction; EF 68 with normal wall motion.    Ecg today shows NSR with normal Ecg. I have personally reviewed and interpreted this study.  Assessment / Plan: 1. Hypertension, well controlled. Continue metoprolol, amlodipine, and benazepril.  2. Hypercholesterolemia. She has severe familial hypercholesterolemia with prior LDL cholesterol 224. Now on  atorvastatin 40 mg daily. Recommend repeat fasting lab work with lipids and LFTs when seen next by primary care.   3. Palpitations. She has a history of PACs and short burst of PACs on previous monitor. We will continue with metoprolol.  4. Coronary calcification noted on CT. I reviewed personally and she does have heavy calcification in the LAD territory. Recommend she take ASA 81 mg daily. Myoview shows no ischemia.  Follow up in one year.

## 2016-10-12 ENCOUNTER — Other Ambulatory Visit: Payer: Self-pay

## 2016-10-12 ENCOUNTER — Ambulatory Visit (INDEPENDENT_AMBULATORY_CARE_PROVIDER_SITE_OTHER): Payer: Medicare Other | Admitting: Cardiology

## 2016-10-12 ENCOUNTER — Encounter: Payer: Self-pay | Admitting: Cardiology

## 2016-10-12 VITALS — BP 132/86 | HR 58 | Ht 67.0 in | Wt 152.2 lb

## 2016-10-12 DIAGNOSIS — I491 Atrial premature depolarization: Secondary | ICD-10-CM

## 2016-10-12 DIAGNOSIS — I10 Essential (primary) hypertension: Secondary | ICD-10-CM | POA: Diagnosis not present

## 2016-10-12 DIAGNOSIS — I2510 Atherosclerotic heart disease of native coronary artery without angina pectoris: Secondary | ICD-10-CM | POA: Diagnosis not present

## 2016-10-12 DIAGNOSIS — E7800 Pure hypercholesterolemia, unspecified: Secondary | ICD-10-CM

## 2016-10-12 MED ORDER — ATORVASTATIN CALCIUM 40 MG PO TABS: 40.0000 mg | ORAL_TABLET | Freq: Every day | ORAL | 3 refills | Status: DC

## 2016-10-12 MED ORDER — METOPROLOL TARTRATE 25 MG PO TABS: 25.0000 mg | ORAL_TABLET | Freq: Two times a day (BID) | ORAL | 3 refills | Status: DC

## 2016-10-12 MED ORDER — BENAZEPRIL HCL 20 MG PO TABS: 20.0000 mg | ORAL_TABLET | Freq: Every day | ORAL | 3 refills | Status: DC

## 2016-10-12 MED ORDER — AMLODIPINE BESYLATE 5 MG PO TABS: 5.0000 mg | ORAL_TABLET | Freq: Every day | ORAL | 3 refills | Status: DC

## 2016-10-14 ENCOUNTER — Ambulatory Visit (AMBULATORY_SURGERY_CENTER): Payer: Self-pay

## 2016-10-14 VITALS — Ht 67.0 in | Wt 151.0 lb

## 2016-10-14 DIAGNOSIS — Z860100 Personal history of colon polyps, unspecified: Secondary | ICD-10-CM

## 2016-10-14 MED ORDER — SUPREP BOWEL PREP KIT 17.5-3.13-1.6 GM/177ML PO SOLN: 1.0000 | Freq: Once | ORAL | 0 refills | Status: AC

## 2016-10-14 NOTE — Progress Notes (Signed)
No allergies to eggs or soy No diet meds No home oxygen No past problems with anesthesia  Registered for emmi 

## 2016-10-28 ENCOUNTER — Ambulatory Visit (AMBULATORY_SURGERY_CENTER): Payer: Medicare Other | Admitting: Gastroenterology

## 2016-10-28 ENCOUNTER — Encounter: Payer: Self-pay | Admitting: Gastroenterology

## 2016-10-28 VITALS — BP 134/60 | HR 42 | Temp 96.8°F | Resp 10 | Ht 67.0 in | Wt 151.0 lb

## 2016-10-28 DIAGNOSIS — D122 Benign neoplasm of ascending colon: Secondary | ICD-10-CM

## 2016-10-28 DIAGNOSIS — Z86010 Personal history of colonic polyps: Secondary | ICD-10-CM | POA: Diagnosis present

## 2016-10-28 DIAGNOSIS — Z860101 Personal history of adenomatous and serrated colon polyps: Secondary | ICD-10-CM

## 2016-10-28 MED ORDER — SODIUM CHLORIDE 0.9 % IV SOLN: 500.0000 mL | INTRAVENOUS | Status: DC

## 2016-10-28 NOTE — Patient Instructions (Signed)
YOU HAD AN ENDOSCOPIC PROCEDURE TODAY AT [facility]:   Refer to the procedure report that was given to you for any specific questions about what was found during the examination.  If the procedure report does not answer your questions, please call your gastroenterologist to clarify.  If you requested that your care partner not be given the details of your procedure findings, then the procedure report has been included in a sealed envelope for you to review at your convenience later.  YOU SHOULD EXPECT: Some feelings of bloating in the abdomen. Passage of more gas than usual.  Walking can help get rid of the air that was put into your GI tract during the procedure and reduce the bloating. If you had a lower endoscopy (such as a colonoscopy or flexible sigmoidoscopy) you may notice spotting of blood in your stool or on the toilet paper. If you underwent a bowel prep for your procedure, you may not have a normal bowel movement for a few days.  Please Note:  You might notice some irritation and congestion in your nose or some drainage.  This is from the oxygen used during your procedure.  There is no need for concern and it should clear up in a day or so.  SYMPTOMS TO REPORT IMMEDIATELY:   Following lower endoscopy (colonoscopy or flexible sigmoidoscopy):  Excessive amounts of blood in the stool  Significant tenderness or worsening of abdominal pains  Swelling of the abdomen that is new, acute  Fever of 100F or higher    For urgent or emergent issues, a gastroenterologist can be reached at any hour by calling [phone].   DIET:  We do recommend a small meal at first, but then you may proceed to your regular diet.  Drink plenty of fluids but you should avoid alcoholic beverages for 24 hours.  ACTIVITY:  You should plan to take it easy for the rest of today and you should NOT DRIVE or use heavy machinery until tomorrow (because of the sedation medicines used during the test).     FOLLOW UP: Our staff will call the number listed on your records the next business day following your procedure to check on you and address any questions or concerns that you may have regarding the information given to you following your procedure. If we do not reach you, we will leave a message.  However, if you are feeling well and you are not experiencing any problems, there is no need to return our call.  We will assume that you have returned to your regular daily activities without incident.  If any biopsies were taken you will be contacted by phone or by letter within the next 1-3 weeks.  Please call us at [phone] if you have not heard about the biopsies in 3 weeks.    SIGNATURES/CONFIDENTIALITY: You and/or your care partner have signed paperwork which will be entered into your electronic medical record.  These signatures attest to the fact that that the information above on your After Visit Summary has been reviewed and is understood.  Full responsibility of the confidentiality of this discharge information lies with you and/or your care-partner.   INFORMATION ON POLYPS,DIVERTICULOSIS,AND HEMORRHOIDS   NO ASPIRIN,IBUPROFEN,NAPROXEN ,OR OTHER ANTI INFLAMMATORY PRODUCTS FOR 2 WEEKS

## 2016-10-28 NOTE — Progress Notes (Signed)
Called to room to assist during endoscopic procedure.  Patient ID and intended procedure confirmed with present staff. Received instructions for my participation in the procedure from the performing physician.  

## 2016-10-28 NOTE — Op Note (Signed)
[place] Patient Name: Karla Chavez Procedure Date: 10/28/2016 10:24 AM MRN: [id number] Endoscopist: Ladene Artist , MD Age: 81 Referring MD:  Date of Birth: [birth year] Gender: Female Account #: [id number] Procedure:                Colonoscopy Indications:              Surveillance: Personal history of adenomatous                            polyps on last colonoscopy > 5 years ago Medicines:                Monitored Anesthesia Care Procedure:                Pre-Anesthesia Assessment:                           - Prior to the procedure, a History and Physical                            was performed, and patient medications and                            allergies were reviewed. The patient's tolerance of                            previous anesthesia was also reviewed. The risks                            and benefits of the procedure and the sedation                            options and risks were discussed with the patient.                            All questions were answered, and informed consent                            was obtained. Prior Anticoagulants: The patient has                            taken no previous anticoagulant or antiplatelet                            agents. ASA Grade Assessment: II - A patient with                            mild systemic disease. After reviewing the risks                            and benefits, the patient was deemed in                            satisfactory condition to undergo the procedure.  After obtaining informed consent, the colonoscope                            was passed under direct vision. Throughout the                            procedure, the patient's blood pressure, pulse, and                            oxygen saturations were monitored continuously. The                            Model PCF-[id number]) scope was introduced                            through the anus  and advanced to the the cecum,                            identified by appendiceal orifice and ileocecal                            valve. The ileocecal valve, appendiceal orifice,                            and rectum were photographed. The quality of the                            bowel preparation was excellent. The colonoscopy                            was performed without difficulty. The patient                            tolerated the procedure well. Scope In: 10:46:35 AM Scope Out: 10:58:31 AM Scope Withdrawal Time: 0 hours 9 minutes 17 seconds  Total Procedure Duration: 0 hours 11 minutes 56 seconds  Findings:                 The perianal and digital rectal examinations were                            normal.                           Two sessile polyps were found in the ascending                            colon. The polyps were 6 to 8 mm in size. These                            polyps were removed with a cold snare and hot snare                            respectively. Resection and retrieval were complete.  Internal hemorrhoids were found during                            retroflexion. The hemorrhoids were medium-sized and                            Grade I (internal hemorrhoids that do not prolapse).                           Multiple small-mouthed diverticula were found in                            the sigmoid colon. There was no evidence of                            diverticular bleeding.                           The exam was otherwise without abnormality on                            direct and retroflexion views. Complications:            No immediate complications. Estimated blood loss:                            None. Estimated Blood Loss:     Estimated blood loss: none. Impression:               - Two 6 to 8 mm polyps in the ascending colon,                            removed with a cold snare. Resected and retrieved.                            - Internal hemorrhoids.                           - Mild diverticulosis in the sigmoid colon. There                            was no evidence of diverticular bleeding.                           - The examination was otherwise normal on direct                            and retroflexion views. Recommendation:           - Repeat colonoscopy in 5 years for surveillance.                           - Patient has a contact number available for                            emergencies. The signs and symptoms of potential  delayed complications were discussed with the                            patient. Return to normal activities tomorrow.                            Written discharge instructions were provided to the                            patient.                           - Resume previous diet.                           - Continue present medications.                           - Await pathology results.                           - No aspirin, ibuprofen, naproxen, or other                            non-steroidal anti-inflammatory drugs for 2 weeks                            after polyp removal. Ladene Artist, MD 10/28/2016 11:02:46 AM This report has been signed electronically.

## 2016-10-28 NOTE — Progress Notes (Signed)
To recovery, report to [place], RN, VSS 

## 2016-10-29 ENCOUNTER — Telehealth: Payer: Self-pay | Admitting: *Deleted

## 2016-10-29 NOTE — Telephone Encounter (Signed)
  Follow up Call-  Call back number 10/28/2016  Post procedure Call Back phone  # [phone]  Permission to leave phone message Yes  Some recent data might be hidden     Patient questions:  Do you have a fever, pain , or abdominal swelling? No. Pain Score  0 *  Have you tolerated food without any problems? Yes.    Have you been able to return to your normal activities? No.  Do you have any questions about your discharge instructions: Diet   No. Medications  No. Follow up visit  No.  Do you have questions or concerns about your Care? No.  Actions: * If pain score is 4 or above: No action needed, pain <4.

## 2016-11-09 ENCOUNTER — Encounter: Payer: Self-pay | Admitting: Gastroenterology

## 2016-11-11 ENCOUNTER — Other Ambulatory Visit: Payer: Self-pay

## 2016-12-07 ENCOUNTER — Other Ambulatory Visit: Payer: Self-pay | Admitting: Family Medicine

## 2016-12-07 DIAGNOSIS — R911 Solitary pulmonary nodule: Secondary | ICD-10-CM

## 2016-12-14 ENCOUNTER — Ambulatory Visit
Admission: RE | Admit: 2016-12-14 | Discharge: 2016-12-14 | Disposition: A | Discharge status: Home / Self Care | Payer: Medicare Other | Source: Ambulatory Visit | Attending: Family Medicine | Admitting: Family Medicine

## 2016-12-14 DIAGNOSIS — R911 Solitary pulmonary nodule: Secondary | ICD-10-CM

## 2016-12-14 MED ORDER — GADOBENATE DIMEGLUMINE 529 MG/ML IV SOLN
14.0000 mL | Freq: Once | INTRAVENOUS | Status: AC | PRN
  Administered 2016-12-14: 14 mL via INTRAVENOUS

## 2017-04-07 ENCOUNTER — Other Ambulatory Visit: Payer: Self-pay | Admitting: Cardiology

## 2017-07-22 ENCOUNTER — Other Ambulatory Visit: Payer: Self-pay | Admitting: Family Medicine

## 2017-07-22 DIAGNOSIS — H8110 Benign paroxysmal vertigo, unspecified ear: Secondary | ICD-10-CM

## 2017-07-26 ENCOUNTER — Ambulatory Visit
Admission: RE | Admit: 2017-07-26 | Discharge: 2017-07-26 | Disposition: A | Discharge status: Home / Self Care | Payer: Medicare Other | Source: Ambulatory Visit | Attending: Family Medicine | Admitting: Family Medicine

## 2017-07-26 DIAGNOSIS — H8110 Benign paroxysmal vertigo, unspecified ear: Secondary | ICD-10-CM

## 2017-10-04 ENCOUNTER — Other Ambulatory Visit: Payer: Self-pay

## 2017-10-04 MED ORDER — AMLODIPINE BESYLATE 5 MG PO TABS: 5.0000 mg | ORAL_TABLET | Freq: Every day | ORAL | 0 refills | Status: AC

## 2017-11-01 ENCOUNTER — Other Ambulatory Visit: Payer: Self-pay | Admitting: *Deleted

## 2017-11-01 ENCOUNTER — Other Ambulatory Visit: Payer: Self-pay

## 2017-11-01 MED ORDER — BENAZEPRIL HCL 20 MG PO TABS: 20.0000 mg | ORAL_TABLET | Freq: Every day | ORAL | 0 refills | Status: DC

## 2017-11-04 ENCOUNTER — Other Ambulatory Visit: Payer: Self-pay

## 2017-12-06 ENCOUNTER — Telehealth: Payer: Self-pay | Admitting: Cardiology

## 2017-12-06 MED ORDER — BENAZEPRIL HCL 20 MG PO TABS: 20.0000 mg | ORAL_TABLET | Freq: Every day | ORAL | 0 refills | Status: DC

## 2017-12-06 NOTE — Telephone Encounter (Signed)
Rx request sent to pharmacy.  

## 2018-01-03 ENCOUNTER — Other Ambulatory Visit: Payer: Self-pay | Admitting: *Deleted

## 2018-01-03 MED ORDER — METOPROLOL TARTRATE 25 MG PO TABS: 25.0000 mg | ORAL_TABLET | Freq: Two times a day (BID) | ORAL | 0 refills | Status: DC

## 2018-10-11 NOTE — Progress Notes (Signed)
Karla Chavez Date of Birth: Dec 23, 1934   History of Present Illness: Karla Chavez is seen for  followup of hypertension. She was last seen 2 years ago.   She has a history of HTN, hyperlipidemia, and family history of CAD.  She had a normal Myoview study in 2009 and a normal Echo in 2012. Reports compliance with meds. She quit taking lipitor and is now taking a "heart savior" supplement.  Denies any chest pain or dyspnea. Does complain of anxiety and some imbalance. Chest CT in the past did show coronary calcification. Repeat Myoview study in August 2016 was normal. CT have also shown aortic ectasia. 3.8 cm in ascending aorta. No change from 2015>>2018.   She reports intermittent high BP up to 190s. Feels like her blood is "hot". She has intermittent severe hives. Has seen an allergist and dermatologist without relief. States this has been going on for 3-4 years.   Current Outpatient Medications on File Prior to Visit  Medication Sig Dispense Refill  . ALPRAZolam (XANAX) 0.5 MG tablet Take 0.5 mg by mouth at bedtime as needed for anxiety.    Marland Kitchen amLODipine (NORVASC) 5 MG tablet Take 1 tablet (5 mg total) by mouth daily. 30 tablet 0  . citalopram (CELEXA) 10 MG tablet Take 5 mg by mouth daily.     Marland Kitchen ibuprofen (ADVIL,MOTRIN) 200 MG tablet Take 200 mg by mouth every 6 (six) hours as needed.    . metoprolol tartrate (LOPRESSOR) 25 MG tablet Take 1 tablet (25 mg total) by mouth 2 (two) times daily. NEED OV. 60 tablet 0  . OVER THE COUNTER MEDICATION Heart Savior    . zolpidem (AMBIEN) 5 MG tablet Take 5 mg by mouth at bedtime as needed for sleep.     Current Facility-Administered Medications on File Prior to Visit  Medication Dose Route Frequency Provider Last Rate Last Dose  . 0.9 %  sodium chloride infusion  500 mL Intravenous Continuous Karla Artist, MD        Allergies  Allergen Reactions  . Amoxicillin     Yeast infection    Past Medical History:  Diagnosis Date  .  Adenomatous colon polyp 03/2010  . Arthritis    "joints" (03/01/2014)  . Family history of anesthesia complication    pts sister has severe nausea and vomiting  . GERD (gastroesophageal reflux disease)   . History of blood transfusion 1958   "related to childbirth"  . Hyperlipidemia   . Hypertension   . Near syncope   . Palpitations   . Pneumonia 06/2009  . PONV (postoperative nausea and vomiting)    "this OR" (03/01/2014)  . Vertigo    hx of occured on 01/01/14    Past Surgical History:  Procedure Laterality Date  . APPENDECTOMY  1958  . CARDIOVASCULAR STRESS TEST  01/09/2008   EF 82%  . CERVICAL CONE BIOPSY  1970's  . COLONOSCOPY    . PANENDOSCOPY    . PAROTIDECTOMY Left 03/01/2104   superficial with facial nerve preservation   . PAROTIDECTOMY Left 03/01/2014   Procedure: LEFT SUPERFICIAL  VS TOTAL PAROTIDECTOMY;  Surgeon: Karla Marble, MD;  Location: [place];  Service: ENT;  Laterality: Left;  . TUBAL LIGATION  1972  . tumor in neck  2015    Social History   Tobacco Use  Smoking Status Never Smoker  Smokeless Tobacco Never Used    Social History   Substance and Sexual Activity  Alcohol Use No  Family History  Problem Relation Age of Onset  . Hypertension Mother   . Stroke Mother 51  . Heart disease Father 34       massive MI  . Heart disease Sister 59       CABG  . Heart disease Brother 83       CABG    Review of Systems: As noted in history of present illness.  All other systems were reviewed and are negative.  Physical Exam: BP (!) 146/90   Pulse 65   Ht 5\' 7"  (1.702 m)   Wt 152 lb 12.8 oz (69.3 kg)   LMP  (LMP Unknown)   BMI 23.93 kg/m  GENERAL:  Well appearing WF in NAD HEENT:  PERRL, EOMI, sclera are clear. Oropharynx is clear. NECK:  No jugular venous distention, carotid upstroke brisk and symmetric, no bruits, no thyromegaly or adenopathy LUNGS:  Clear to auscultation bilaterally CHEST:  Unremarkable HEART:  RRR,  PMI not displaced or  sustained,S1 and S2 within normal limits, no S3, no S4: no clicks, no rubs, no murmurs ABD:  Soft, nontender. BS +, no masses or bruits. No hepatomegaly, no splenomegaly EXT:  2 + pulses throughout, no edema, no cyanosis no clubbing SKIN:  Warm and dry.  No rashes NEURO:  Alert and oriented x 3. Cranial nerves II through XII intact. PSYCH:  Cognitively intact    LABORATORY DATA: Lab Results  Component Value Date   WBC 6.7 02/20/2014   HGB 14.6 02/20/2014   HCT 42.7 02/20/2014   PLT 237 02/20/2014   GLUCOSE 87 06/13/2015   CHOL 179 06/13/2015   TRIG 115 06/13/2015   HDL 61 06/13/2015   LDLDIRECT 224.3 06/10/2011   LDLCALC 95 06/13/2015   ALT 13 06/13/2015   AST 14 06/13/2015   NA 143 06/13/2015   K 4.2 06/13/2015   CL 107 06/13/2015   CREATININE 0.94 (H) 06/13/2015   BUN 18 06/13/2015   CO2 26 06/13/2015   TSH 1.40 06/10/2011   INR 0.84 02/01/2014   HGBA1C  06/26/2009    5.8 (NOTE) The ADA recommends the following therapeutic goal for glycemic control related to Hgb A1c measurement: Goal of therapy: <6.5 Hgb A1c  Reference: American Diabetes Association: Clinical Practice Recommendations 2010, Diabetes Care, 2010, 33: (Suppl  1).   Labs dated 07/22/17: cholesterol 226, triglycerides 157, HDL 57, LDL 157. TSH 8.29. CMET normal. CBC normal.  ECG today shows NSR, rate 65. Minor nonspecific ST abnormality.  Otherwise normal. I have personally reviewed and interpreted this study.  Myoview 07/02/15: Study Highlights    The left ventricular ejection fraction is hyperdynamic (>65%).  Nuclear stress EF: 68%.  There was no ST segment deviation noted during stress.  The study is normal.   Normal stress nuclear study with no ischemia or infarction; EF 68 with normal wall motion.    Ecg today shows NSR with normal Ecg. I have personally reviewed and interpreted this study.  Assessment / Plan: 1. Hypertension, suboptimal control. She is also concerned that her medication  could be causing her hives. Will switch benazepril to olmesartan 40 mg daily. The amlodipine was started after the hives so is not a concern. Could consider an alternative beta blocker if symptoms do not improve. Will follow up in 3 months.   2. Hypercholesterolemia. She has severe familial hypercholesterolemia with prior LDL cholesterol 224. Improved to 157 on lipitor but she is no longer taking. Will repeat fasting labs today. I am doubtful that  her supplement is helping. Depending on lab results would recommend resuming lipitor.   3. Palpitations. She has a history of PACs and short burst of PACs on previous monitor. We will continue with metoprolol.  4. Coronary calcification noted on CT.She does have heavy calcification in the LAD territory. Recommend she take ASA 81 mg daily. Myoview shows no ischemia. Risk factor modification  5. Aortic ectasia. Stable. Focus on BP control  6. Hives. Will see if change in medications has an affect. Would avoid OTC supplements. Avoid NSAIDs.   Follow up in 3 months

## 2018-10-14 ENCOUNTER — Encounter (INDEPENDENT_AMBULATORY_CARE_PROVIDER_SITE_OTHER): Payer: Self-pay

## 2018-10-14 ENCOUNTER — Ambulatory Visit (INDEPENDENT_AMBULATORY_CARE_PROVIDER_SITE_OTHER): Payer: Medicare Other | Admitting: Cardiology

## 2018-10-14 ENCOUNTER — Encounter: Payer: Self-pay | Admitting: Cardiology

## 2018-10-14 VITALS — BP 146/90 | HR 65 | Ht 67.0 in | Wt 152.8 lb

## 2018-10-14 DIAGNOSIS — I10 Essential (primary) hypertension: Secondary | ICD-10-CM | POA: Diagnosis not present

## 2018-10-14 DIAGNOSIS — E7800 Pure hypercholesterolemia, unspecified: Secondary | ICD-10-CM

## 2018-10-14 DIAGNOSIS — I2510 Atherosclerotic heart disease of native coronary artery without angina pectoris: Secondary | ICD-10-CM

## 2018-10-14 DIAGNOSIS — I77819 Aortic ectasia, unspecified site: Secondary | ICD-10-CM | POA: Diagnosis not present

## 2018-10-14 LAB — HEPATIC FUNCTION PANEL
ALK PHOS: 84 IU/L (ref 39–117)
ALT: 11 IU/L (ref 0–32)
AST: 13 IU/L (ref 0–40)
Albumin: 4.7 g/dL (ref 3.5–4.7)
BILIRUBIN, DIRECT: 0.11 mg/dL (ref 0.00–0.40)
Bilirubin Total: 0.5 mg/dL (ref 0.0–1.2)
TOTAL PROTEIN: 6.8 g/dL (ref 6.0–8.5)

## 2018-10-14 LAB — CBC WITH DIFFERENTIAL/PLATELET
Basophils Absolute: 0 10*3/uL (ref 0.0–0.2)
Basos: 1 %
EOS (ABSOLUTE): 0.1 10*3/uL (ref 0.0–0.4)
EOS: 1 %
HEMATOCRIT: 42.6 % (ref 34.0–46.6)
Hemoglobin: 14.8 g/dL (ref 11.1–15.9)
Immature Grans (Abs): 0 10*3/uL (ref 0.0–0.1)
Immature Granulocytes: 0 %
LYMPHS ABS: 1.3 10*3/uL (ref 0.7–3.1)
Lymphs: 26 %
MCH: 32.1 pg (ref 26.6–33.0)
MCHC: 34.7 g/dL (ref 31.5–35.7)
MCV: 92 fL (ref 79–97)
MONOS ABS: 0.3 10*3/uL (ref 0.1–0.9)
Monocytes: 6 %
Neutrophils Absolute: 3.3 10*3/uL (ref 1.4–7.0)
Neutrophils: 66 %
PLATELETS: 284 10*3/uL (ref 150–450)
RBC: 4.61 x10E6/uL (ref 3.77–5.28)
RDW: 12.3 % (ref 12.3–15.4)
WBC: 5.1 10*3/uL (ref 3.4–10.8)

## 2018-10-14 LAB — LIPID PANEL
Chol/HDL Ratio: 5.3 ratio — ABNORMAL HIGH (ref 0.0–4.4)
Cholesterol, Total: 298 mg/dL — ABNORMAL HIGH (ref 100–199)
HDL: 56 mg/dL (ref >39)
LDL CALC: 196 mg/dL — AB (ref 0–99)
Triglycerides: 232 mg/dL — ABNORMAL HIGH (ref 0–149)
VLDL CHOLESTEROL CAL: 46 mg/dL — AB (ref 5–40)

## 2018-10-14 LAB — BASIC METABOLIC PANEL
BUN / CREAT RATIO: 17 (ref 12–28)
BUN: 19 mg/dL (ref 8–27)
CHLORIDE: 104 mmol/L (ref 96–106)
CO2: 20 mmol/L (ref 20–29)
CREATININE: 1.1 mg/dL — AB (ref 0.57–1.00)
Calcium: 9.9 mg/dL (ref 8.7–10.3)
GFR calc Af Amer: 54 mL/min/{1.73_m2} — ABNORMAL LOW (ref >59)
GFR calc non Af Amer: 47 mL/min/{1.73_m2} — ABNORMAL LOW (ref >59)
GLUCOSE: 97 mg/dL (ref 65–99)
Potassium: 5 mmol/L (ref 3.5–5.2)
Sodium: 143 mmol/L (ref 134–144)

## 2018-10-14 MED ORDER — ASPIRIN EC 81 MG PO TBEC: 81.0000 mg | DELAYED_RELEASE_TABLET | Freq: Every day | ORAL | 3 refills | Status: DC

## 2018-10-14 MED ORDER — OLMESARTAN MEDOXOMIL 40 MG PO TABS: 40.0000 mg | ORAL_TABLET | Freq: Every day | ORAL | 3 refills | Status: DC

## 2018-10-14 NOTE — Patient Instructions (Signed)
Stop benazepril.   Start olmesartan 40 mg daily for Blood pressure  Reduce ASA to 81 mg daily  We will check lab work today.

## 2018-10-18 ENCOUNTER — Other Ambulatory Visit: Payer: Self-pay

## 2018-10-18 DIAGNOSIS — I2510 Atherosclerotic heart disease of native coronary artery without angina pectoris: Secondary | ICD-10-CM

## 2018-10-18 DIAGNOSIS — E7800 Pure hypercholesterolemia, unspecified: Secondary | ICD-10-CM

## 2018-10-18 MED ORDER — ATORVASTATIN CALCIUM 80 MG PO TABS: 80.0000 mg | ORAL_TABLET | Freq: Every day | ORAL | 6 refills | Status: DC

## 2018-10-18 MED ORDER — IRBESARTAN 300 MG PO TABS: 300.0000 mg | ORAL_TABLET | Freq: Every day | ORAL | 6 refills | Status: DC

## 2018-10-18 NOTE — Progress Notes (Signed)
lipid

## 2018-10-20 ENCOUNTER — Telehealth: Payer: Self-pay

## 2018-10-20 NOTE — Telephone Encounter (Signed)
Spoke to patient on 10/18/18 she stated Olmesartan too expensive.Dr.Jordan advised to take Irbesartan 300 mg daily.Prescription sent to pharmacy.

## 2019-01-14 NOTE — Progress Notes (Signed)
Karla Chavez Date of Birth: 1935/03/27   History of Present Illness: Karla Chavez is seen for  followup of hypertension. She has a history of HTN, hyperlipidemia, and family history of CAD.  She had a normal Myoview study in 2009 and a normal Echo in 2012. Reports compliance with meds. She quit taking lipitor and was taking a "heart savior" supplement.  We repeated labs and LDL was 196. We recommended she resume lipitor 80 mg daily.  Chest CT in the past did show coronary calcification. Repeat Myoview study in August 2016 was normal. CT have also shown aortic ectasia. 3.8 cm in ascending aorta. No change from 2015>>2018.   She had been experiencing hives for 4 years. These all resolved when we switched her from benazepril to an ARB. She is tolerating medication well. Did not take meds today. Notes BP at home typically 110/60.   Current Outpatient Medications on File Prior to Visit  Medication Sig Dispense Refill  . ALPRAZolam (XANAX) 0.5 MG tablet Take 0.5 mg by mouth at bedtime as needed for anxiety.    Marland Kitchen amLODipine (NORVASC) 5 MG tablet Take 1 tablet (5 mg total) by mouth daily. 30 tablet 0  . aspirin EC 81 MG tablet Take 1 tablet (81 mg total) by mouth daily. 90 tablet 3  . atorvastatin (LIPITOR) 80 MG tablet Take 1 tablet (80 mg total) by mouth daily. 30 tablet 6  . citalopram (CELEXA) 10 MG tablet Take 5 mg by mouth daily.     Marland Kitchen ibuprofen (ADVIL,MOTRIN) 200 MG tablet Take 200 mg by mouth every 6 (six) hours as needed.    . irbesartan (AVAPRO) 300 MG tablet Take 1 tablet (300 mg total) by mouth daily. 30 tablet 6  . metoprolol tartrate (LOPRESSOR) 25 MG tablet Take 1 tablet (25 mg total) by mouth 2 (two) times daily. NEED OV. 60 tablet 0  . zolpidem (AMBIEN) 5 MG tablet Take 5 mg by mouth at bedtime as needed for sleep.    Marland Kitchen loratadine (CLARITIN) 10 MG tablet as needed.     Current Facility-Administered Medications on File Prior to Visit  Medication Dose Route Frequency Provider  Last Rate Last Dose  . 0.9 %  sodium chloride infusion  500 mL Intravenous Continuous Ladene Artist, MD        Allergies  Allergen Reactions  . Amoxicillin     Yeast infection  . Benazepril Hcl Hives    Past Medical History:  Diagnosis Date  . Adenomatous colon polyp 03/2010  . Arthritis    "joints" (03/01/2014)  . Family history of anesthesia complication    pts sister has severe nausea and vomiting  . GERD (gastroesophageal reflux disease)   . History of blood transfusion 1958   "related to childbirth"  . Hyperlipidemia   . Hypertension   . Near syncope   . Palpitations   . Pneumonia 06/2009  . PONV (postoperative nausea and vomiting)    "this OR" (03/01/2014)  . Vertigo    hx of occured on 01/01/14    Past Surgical History:  Procedure Laterality Date  . APPENDECTOMY  1958  . CARDIOVASCULAR STRESS TEST  01/09/2008   EF 82%  . CERVICAL CONE BIOPSY  1970's  . COLONOSCOPY    . PANENDOSCOPY    . PAROTIDECTOMY Left 03/01/2104   superficial with facial nerve preservation   . PAROTIDECTOMY Left 03/01/2014   Procedure: LEFT SUPERFICIAL  VS TOTAL PAROTIDECTOMY;  Surgeon: Jodi Marble, MD;  Location: [facility]  OR;  Service: ENT;  Laterality: Left;  . TUBAL LIGATION  1972  . tumor in neck  2015    Social History   Tobacco Use  Smoking Status Never Smoker  Smokeless Tobacco Never Used    Social History   Substance and Sexual Activity  Alcohol Use No    Family History  Problem Relation Age of Onset  . Hypertension Mother   . Stroke Mother 94  . Heart disease Father 69       massive MI  . Heart disease Sister 39       CABG  . Heart disease Brother 77       CABG    Review of Systems: As noted in history of present illness.  All other systems were reviewed and are negative.  Physical Exam: BP (!) 151/75 (BP Location: Left Arm, Patient Position: Sitting, Cuff Size: Normal)   Pulse (!) 57   Ht 5\' 7"  (1.702 m)   Wt 163 lb (73.9 kg)   LMP  (LMP Unknown)   SpO2 98%    BMI 25.53 kg/m  GENERAL:  Well appearing WF in NAD HEENT:  PERRL, EOMI, sclera are clear. Oropharynx is clear. NECK:  No jugular venous distention, carotid upstroke brisk and symmetric, no bruits, no thyromegaly or adenopathy LUNGS:  Clear to auscultation bilaterally CHEST:  Unremarkable HEART:  RRR,  PMI not displaced or sustained,S1 and S2 within normal limits, no S3, no S4: no clicks, no rubs, no murmurs ABD:  Soft, nontender. BS +, no masses or bruits. No hepatomegaly, no splenomegaly EXT:  2 + pulses throughout, no edema, no cyanosis no clubbing SKIN:  Warm and dry.  No rashes NEURO:  Alert and oriented x 3. Cranial nerves II through XII intact. PSYCH:  Cognitively intact      LABORATORY DATA: Lab Results  Component Value Date   WBC 5.1 10/14/2018   HGB 14.8 10/14/2018   HCT 42.6 10/14/2018   PLT 284 10/14/2018   GLUCOSE 97 10/14/2018   CHOL 298 (H) 10/14/2018   TRIG 232 (H) 10/14/2018   HDL 56 10/14/2018   LDLDIRECT 224.3 06/10/2011   LDLCALC 196 (H) 10/14/2018   ALT 11 10/14/2018   AST 13 10/14/2018   NA 143 10/14/2018   K 5.0 10/14/2018   CL 104 10/14/2018   CREATININE 1.10 (H) 10/14/2018   BUN 19 10/14/2018   CO2 20 10/14/2018   TSH 1.40 06/10/2011   INR 0.84 02/01/2014   HGBA1C  06/26/2009    5.8 (NOTE) The ADA recommends the following therapeutic goal for glycemic control related to Hgb A1c measurement: Goal of therapy: <6.5 Hgb A1c  Reference: American Diabetes Association: Clinical Practice Recommendations 2010, Diabetes Care, 2010, 33: (Suppl  1).   Labs dated 07/22/17: cholesterol 226, triglycerides 157, HDL 57, LDL 157. TSH 8.29. CMET normal. CBC normal.  Myoview 07/02/15: Study Highlights    The left ventricular ejection fraction is hyperdynamic (>65%).  Nuclear stress EF: 68%.  There was no ST segment deviation noted during stress.  The study is normal.   Normal stress nuclear study with no ischemia or infarction; EF 68 with normal wall  motion.    Assessment / Plan: 1. Hypertension, now under excellent control. Continue current therapy as listed.   2. Hypercholesterolemia. She has severe familial hypercholesterolemia with prior LDL cholesterol 196. Now on high dose lipitor and tolerating well. Will check fasting lab today.  3. Palpitations. She has a history of PACs and short burst  of PACs on previous monitor. We will continue with metoprolol.  4. Coronary calcification noted on CT.She does have heavy calcification in the LAD territory. Recommend she take ASA 81 mg daily. Myoview shows no ischemia. Risk factor modification  5. Aortic ectasia. Stable. Focus on BP control  6. Hives. Appears to have been related to ACEi. Now resolved.  Follow up in 6 months

## 2019-01-16 ENCOUNTER — Encounter: Payer: Self-pay | Admitting: Cardiology

## 2019-01-16 ENCOUNTER — Other Ambulatory Visit: Payer: Self-pay

## 2019-01-16 ENCOUNTER — Ambulatory Visit (INDEPENDENT_AMBULATORY_CARE_PROVIDER_SITE_OTHER): Payer: Medicare Other | Admitting: Cardiology

## 2019-01-16 VITALS — BP 151/75 | HR 57 | Ht 67.0 in | Wt 163.0 lb

## 2019-01-16 DIAGNOSIS — I10 Essential (primary) hypertension: Secondary | ICD-10-CM | POA: Diagnosis not present

## 2019-01-16 DIAGNOSIS — I2510 Atherosclerotic heart disease of native coronary artery without angina pectoris: Secondary | ICD-10-CM

## 2019-01-16 DIAGNOSIS — E7800 Pure hypercholesterolemia, unspecified: Secondary | ICD-10-CM | POA: Diagnosis not present

## 2019-01-17 LAB — HEPATIC FUNCTION PANEL
ALBUMIN: 4.5 g/dL (ref 3.6–4.6)
ALT: 18 IU/L (ref 0–32)
AST: 19 IU/L (ref 0–40)
Alkaline Phosphatase: 106 IU/L (ref 39–117)
BILIRUBIN TOTAL: 0.4 mg/dL (ref 0.0–1.2)
Bilirubin, Direct: 0.11 mg/dL (ref 0.00–0.40)
Total Protein: 6.4 g/dL (ref 6.0–8.5)

## 2019-01-17 LAB — BASIC METABOLIC PANEL
BUN / CREAT RATIO: 18 (ref 12–28)
BUN: 17 mg/dL (ref 8–27)
CO2: 24 mmol/L (ref 20–29)
CREATININE: 0.95 mg/dL (ref 0.57–1.00)
Calcium: 9.3 mg/dL (ref 8.7–10.3)
Chloride: 107 mmol/L — ABNORMAL HIGH (ref 96–106)
GFR calc Af Amer: 65 mL/min/{1.73_m2} (ref >59)
GFR, EST NON AFRICAN AMERICAN: 56 mL/min/{1.73_m2} — AB (ref >59)
Glucose: 93 mg/dL (ref 65–99)
Potassium: 4.8 mmol/L (ref 3.5–5.2)
SODIUM: 146 mmol/L — AB (ref 134–144)

## 2019-01-17 LAB — LIPID PANEL
CHOL/HDL RATIO: 2.8 ratio (ref 0.0–4.4)
Cholesterol, Total: 159 mg/dL (ref 100–199)
HDL: 57 mg/dL (ref >39)
LDL CALC: 87 mg/dL (ref 0–99)
Triglycerides: 75 mg/dL (ref 0–149)
VLDL CHOLESTEROL CAL: 15 mg/dL (ref 5–40)

## 2019-04-21 ENCOUNTER — Other Ambulatory Visit: Payer: Self-pay | Admitting: Cardiology

## 2019-06-02 ENCOUNTER — Telehealth: Payer: Self-pay | Admitting: Cardiology

## 2019-06-02 NOTE — Telephone Encounter (Signed)
LVM for patient to call and schedule followup with Dr. Martinique.

## 2019-07-16 NOTE — Progress Notes (Signed)
Karla Chavez Date of Birth: Oct 01, 1935   History of Present Illness: Karla Chavez is seen for  followup of hypertension. She has a history of HTN, hyperlipidemia, and family history of CAD.  She had a normal Myoview study in 2009 and a normal Echo in 2012. Reports compliance with meds. She quit taking lipitor and was taking a "heart savior" supplement.  We repeated labs and LDL was 196. We recommended she resume lipitor 80 mg daily.  Chest CT in the past did show coronary calcification. Repeat Myoview study in August 2016 was normal. CT have also shown aortic ectasia. 3.8 cm in ascending aorta. No change from 2015>>2018.   She had been experiencing hives for 4 years. These all resolved when we switched her from benazepril to an ARB.   She is doing well today. She does note she feels draggy in the morning and feels her BP is too low at times. May get down to 90s over 50s. No chest pain, dyspnea, or palpitations. No edema.  Current Outpatient Medications on File Prior to Visit  Medication Sig Dispense Refill  . ALPRAZolam (XANAX) 0.5 MG tablet Take 0.5 mg by mouth at bedtime as needed for anxiety.    Marland Kitchen amLODipine (NORVASC) 5 MG tablet Take 1 tablet (5 mg total) by mouth daily. 30 tablet 0  . aspirin EC 81 MG tablet Take 1 tablet (81 mg total) by mouth daily. 90 tablet 3  . atorvastatin (LIPITOR) 80 MG tablet TAKE 1 TABLET BY MOUTH EVERY DAY 90 tablet 2  . citalopram (CELEXA) 10 MG tablet Take 5 mg by mouth daily.     Marland Kitchen ibuprofen (ADVIL,MOTRIN) 200 MG tablet Take 200 mg by mouth every 6 (six) hours as needed.    . loratadine (CLARITIN) 10 MG tablet as needed.    . metoprolol tartrate (LOPRESSOR) 25 MG tablet Take 1 tablet (25 mg total) by mouth 2 (two) times daily. NEED OV. 60 tablet 0  . zolpidem (AMBIEN) 5 MG tablet Take 5 mg by mouth at bedtime as needed for sleep.     Current Facility-Administered Medications on File Prior to Visit  Medication Dose Route Frequency Provider Last Rate  Last Dose  . 0.9 %  sodium chloride infusion  500 mL Intravenous Continuous Ladene Artist, MD        Allergies  Allergen Reactions  . Amoxicillin     Yeast infection  . Benazepril Hcl Hives    Past Medical History:  Diagnosis Date  . Adenomatous colon polyp 03/2010  . Arthritis    "joints" (03/01/2014)  . Family history of anesthesia complication    pts sister has severe nausea and vomiting  . GERD (gastroesophageal reflux disease)   . History of blood transfusion 1958   "related to childbirth"  . Hyperlipidemia   . Hypertension   . Near syncope   . Palpitations   . Pneumonia 06/2009  . PONV (postoperative nausea and vomiting)    "this OR" (03/01/2014)  . Vertigo    hx of occured on 01/01/14    Past Surgical History:  Procedure Laterality Date  . APPENDECTOMY  1958  . CARDIOVASCULAR STRESS TEST  01/09/2008   EF 82%  . CERVICAL CONE BIOPSY  1970's  . COLONOSCOPY    . PANENDOSCOPY    . PAROTIDECTOMY Left 03/01/2104   superficial with facial nerve preservation   . PAROTIDECTOMY Left 03/01/2014   Procedure: LEFT SUPERFICIAL  VS TOTAL PAROTIDECTOMY;  Surgeon: Jodi Marble, MD;  Location: [facility] OR;  Service: ENT;  Laterality: Left;  . TUBAL LIGATION  1972  . tumor in neck  2015    Social History   Tobacco Use  Smoking Status Never Smoker  Smokeless Tobacco Never Used    Social History   Substance and Sexual Activity  Alcohol Use No    Family History  Problem Relation Age of Onset  . Hypertension Mother   . Stroke Mother 10  . Heart disease Father 59       massive MI  . Heart disease Sister 72       CABG  . Heart disease Brother 37       CABG    Review of Systems: As noted in history of present illness.  All other systems were reviewed and are negative.  Physical Exam: BP 114/68   Pulse 70   Ht 5\' 7"  (1.702 m)   Wt 160 lb (72.6 kg)   LMP  (LMP Unknown)   SpO2 97%   BMI 25.06 kg/m  GENERAL:  Well appearing WF in NAD HEENT:  PERRL, EOMI, sclera are  clear. Oropharynx is clear. NECK:  No jugular venous distention, carotid upstroke brisk and symmetric, no bruits, no thyromegaly or adenopathy LUNGS:  Clear to auscultation bilaterally CHEST:  Unremarkable HEART:  RRR,  PMI not displaced or sustained,S1 and S2 within normal limits, no S3, no S4: no clicks, no rubs, no murmurs ABD:  Soft, nontender. BS +, no masses or bruits. No hepatomegaly, no splenomegaly EXT:  2 + pulses throughout, no edema, no cyanosis no clubbing SKIN:  Warm and dry.  No rashes NEURO:  Alert and oriented x 3. Cranial nerves II through XII intact. PSYCH:  Cognitively intact      LABORATORY DATA: Lab Results  Component Value Date   WBC 5.1 10/14/2018   HGB 14.8 10/14/2018   HCT 42.6 10/14/2018   PLT 284 10/14/2018   GLUCOSE 93 01/16/2019   CHOL 159 01/16/2019   TRIG 75 01/16/2019   HDL 57 01/16/2019   LDLDIRECT 224.3 06/10/2011   LDLCALC 87 01/16/2019   ALT 18 01/16/2019   AST 19 01/16/2019   NA 146 (H) 01/16/2019   K 4.8 01/16/2019   CL 107 (H) 01/16/2019   CREATININE 0.95 01/16/2019   BUN 17 01/16/2019   CO2 24 01/16/2019   TSH 1.40 06/10/2011   INR 0.84 02/01/2014   HGBA1C  06/26/2009    5.8 (NOTE) The ADA recommends the following therapeutic goal for glycemic control related to Hgb A1c measurement: Goal of therapy: <6.5 Hgb A1c  Reference: American Diabetes Association: Clinical Practice Recommendations 2010, Diabetes Care, 2010, 33: (Suppl  1).   Labs dated 07/22/17: cholesterol 226, triglycerides 157, HDL 57, LDL 157. TSH 8.29. CMET normal. CBC normal.  Myoview 07/02/15: Study Highlights    The left ventricular ejection fraction is hyperdynamic (>65%).  Nuclear stress EF: 68%.  There was no ST segment deviation noted during stress.  The study is normal.   Normal stress nuclear study with no ischemia or infarction; EF 68 with normal wall motion.    Assessment / Plan: 1. Hypertension, now under excellent control. In fact may be too  good. Will reduce olmesartan to 20 mg daily   2. Hypercholesterolemia. She has severe familial hypercholesterolemia with prior LDL cholesterol 196. Now on high dose lipitor and tolerating well. LDL down to 87.   3. Palpitations. She has a history of PACs and short burst of PACs on previous  monitor. We will continue with metoprolol.  4. Coronary calcification noted on CT.She does have heavy calcification in the LAD territory. Recommend she take ASA 81 mg daily. Myoview shows no ischemia. Risk factor modification  5. Aortic ectasia. Stable. Focus on BP control  6. Hives. Appears to have been related to ACEi. Now resolved.  Follow up in 6 months

## 2019-07-18 ENCOUNTER — Other Ambulatory Visit: Payer: Self-pay

## 2019-07-18 ENCOUNTER — Ambulatory Visit (INDEPENDENT_AMBULATORY_CARE_PROVIDER_SITE_OTHER): Payer: Medicare Other | Admitting: Cardiology

## 2019-07-18 ENCOUNTER — Encounter: Payer: Self-pay | Admitting: Cardiology

## 2019-07-18 VITALS — BP 114/68 | HR 70 | Ht 67.0 in | Wt 160.0 lb

## 2019-07-18 DIAGNOSIS — E7800 Pure hypercholesterolemia, unspecified: Secondary | ICD-10-CM

## 2019-07-18 DIAGNOSIS — I2510 Atherosclerotic heart disease of native coronary artery without angina pectoris: Secondary | ICD-10-CM

## 2019-07-18 DIAGNOSIS — I10 Essential (primary) hypertension: Secondary | ICD-10-CM | POA: Diagnosis not present

## 2019-07-18 DIAGNOSIS — I77819 Aortic ectasia, unspecified site: Secondary | ICD-10-CM

## 2019-07-18 MED ORDER — OLMESARTAN MEDOXOMIL 20 MG PO TABS: 20.0000 mg | ORAL_TABLET | Freq: Every day | ORAL | 3 refills | Status: DC

## 2019-07-18 NOTE — Patient Instructions (Signed)
Reduce olmesartan to 20 mg daily  Continue your other therapy  Follow up in 6 months

## 2019-07-19 LAB — HEPATIC FUNCTION PANEL
ALT: 20 IU/L (ref 0–32)
AST: 14 IU/L (ref 0–40)
Albumin: 4.8 g/dL — ABNORMAL HIGH (ref 3.6–4.6)
Alkaline Phosphatase: 102 IU/L (ref 39–117)
Bilirubin Total: 0.5 mg/dL (ref 0.0–1.2)
Bilirubin, Direct: 0.14 mg/dL (ref 0.00–0.40)
Total Protein: 6.7 g/dL (ref 6.0–8.5)

## 2019-07-19 LAB — LIPID PANEL
Chol/HDL Ratio: 2.9 ratio (ref 0.0–4.4)
Cholesterol, Total: 159 mg/dL (ref 100–199)
HDL: 55 mg/dL (ref >39)
LDL Chol Calc (NIH): 80 mg/dL (ref 0–99)
Triglycerides: 136 mg/dL (ref 0–149)
VLDL Cholesterol Cal: 24 mg/dL (ref 5–40)

## 2019-10-19 ENCOUNTER — Encounter: Payer: Self-pay | Admitting: Gastroenterology

## 2019-10-25 ENCOUNTER — Other Ambulatory Visit: Payer: Self-pay | Admitting: Family Medicine

## 2019-10-25 DIAGNOSIS — I712 Thoracic aortic aneurysm, without rupture, unspecified: Secondary | ICD-10-CM

## 2019-11-10 ENCOUNTER — Other Ambulatory Visit: Payer: Medicare Other

## 2019-11-28 ENCOUNTER — Encounter: Payer: Self-pay | Admitting: Family Medicine

## 2019-11-30 ENCOUNTER — Other Ambulatory Visit: Payer: 59

## 2019-12-01 ENCOUNTER — Ambulatory Visit: Payer: Medicare Other | Admitting: Gastroenterology

## 2019-12-01 ENCOUNTER — Other Ambulatory Visit: Payer: 59

## 2020-01-03 ENCOUNTER — Other Ambulatory Visit: Payer: 59

## 2020-01-08 ENCOUNTER — Ambulatory Visit
Admission: RE | Admit: 2020-01-08 | Discharge: 2020-01-08 | Disposition: A | Discharge status: Home / Self Care | Payer: Medicare Other | Source: Ambulatory Visit | Attending: Family Medicine | Admitting: Family Medicine

## 2020-01-08 ENCOUNTER — Other Ambulatory Visit: Payer: Self-pay

## 2020-01-08 DIAGNOSIS — I712 Thoracic aortic aneurysm, without rupture, unspecified: Secondary | ICD-10-CM

## 2020-01-08 MED ORDER — GADOBENATE DIMEGLUMINE 529 MG/ML IV SOLN
14.0000 mL | Freq: Once | INTRAVENOUS | Status: AC | PRN
  Administered 2020-01-08: 14 mL via INTRAVENOUS

## 2020-01-09 ENCOUNTER — Other Ambulatory Visit: Payer: Self-pay | Admitting: Cardiology

## 2020-02-09 NOTE — Progress Notes (Deleted)
Karla Chavez Date of Birth: 26-Feb-1935   History of Present Illness: Karla Chavez is seen for  followup of hypertension. She has a history of HTN, hyperlipidemia, and family history of CAD.  She had a normal Myoview study in 2009 and a normal Echo in 2012. Reports compliance with meds. She quit taking lipitor and was taking a "heart savior" supplement.  We repeated labs and LDL was 196. We recommended she resume lipitor 80 mg daily.  Chest CT in the past did show coronary calcification. Repeat Myoview study in August 2016 was normal. CT have also shown aortic ectasia. 3.8 cm in ascending aorta. No change from 2015>>2018.   She had been experiencing hives for 4 years. These all resolved when we switched her from benazepril to an ARB.   She is doing well today. She does note she feels draggy in the morning and feels her BP is too low at times. May get down to 90s over 50s. No chest pain, dyspnea, or palpitations. No edema.  Current Outpatient Medications on File Prior to Visit  Medication Sig Dispense Refill  . ALPRAZolam (XANAX) 0.5 MG tablet Take 0.5 mg by mouth at bedtime as needed for anxiety.    Marland Kitchen amLODipine (NORVASC) 5 MG tablet Take 1 tablet (5 mg total) by mouth daily. 30 tablet 0  . aspirin EC 81 MG tablet Take 1 tablet (81 mg total) by mouth daily. 90 tablet 3  . atorvastatin (LIPITOR) 80 MG tablet TAKE 1 TABLET BY MOUTH EVERY DAY 90 tablet 2  . citalopram (CELEXA) 10 MG tablet Take 5 mg by mouth daily.     Marland Kitchen ibuprofen (ADVIL,MOTRIN) 200 MG tablet Take 200 mg by mouth every 6 (six) hours as needed.    . loratadine (CLARITIN) 10 MG tablet as needed.    . metoprolol tartrate (LOPRESSOR) 25 MG tablet Take 1 tablet (25 mg total) by mouth 2 (two) times daily. NEED OV. 60 tablet 0  . olmesartan (BENICAR) 20 MG tablet TAKE 1 TABLET BY MOUTH EVERY DAY 90 tablet 1  . zolpidem (AMBIEN) 5 MG tablet Take 5 mg by mouth at bedtime as needed for sleep.     Current Facility-Administered  Medications on File Prior to Visit  Medication Dose Route Frequency Provider Last Rate Last Admin  . 0.9 %  sodium chloride infusion  500 mL Intravenous Continuous Ladene Artist, MD        Allergies  Allergen Reactions  . Amoxicillin     Yeast infection  . Benazepril Hcl Hives    Past Medical History:  Diagnosis Date  . Adenomatous colon polyp 03/2010  . Aortic aneurysm ([place])   . Arthritis    "joints" (03/01/2014)  . Family history of anesthesia complication    pts sister has severe nausea and vomiting  . GERD (gastroesophageal reflux disease)   . History of blood transfusion 1958   "related to childbirth"  . Hyperlipidemia   . Hypertension   . Near syncope   . Palpitations   . Pneumonia 06/2009  . PONV (postoperative nausea and vomiting)    "this OR" (03/01/2014)  . Vertigo    hx of occured on 01/01/14    Past Surgical History:  Procedure Laterality Date  . APPENDECTOMY  1958  . CARDIOVASCULAR STRESS TEST  01/09/2008   EF 82%  . CERVICAL CONE BIOPSY  1970's  . COLONOSCOPY    . PANENDOSCOPY    . PAROTIDECTOMY Left 03/01/2104   superficial with facial  nerve preservation   . PAROTIDECTOMY Left 03/01/2014   Procedure: LEFT SUPERFICIAL  VS TOTAL PAROTIDECTOMY;  Surgeon: Jodi Marble, MD;  Location: [facility];  Service: ENT;  Laterality: Left;  . TUBAL LIGATION  1972  . tumor in neck  2015    Social History   Tobacco Use  Smoking Status Never Smoker  Smokeless Tobacco Never Used    Social History   Substance and Sexual Activity  Alcohol Use No    Family History  Problem Relation Age of Onset  . Hypertension Mother   . Stroke Mother 75  . Heart disease Father 25       massive MI  . Heart disease Sister 55       CABG  . Heart disease Brother 16       CABG    Review of Systems: As noted in history of present illness.  All other systems were reviewed and are negative.  Physical Exam: LMP  (LMP Unknown)  GENERAL:  Well appearing WF in NAD HEENT:  PERRL,  EOMI, sclera are clear. Oropharynx is clear. NECK:  No jugular venous distention, carotid upstroke brisk and symmetric, no bruits, no thyromegaly or adenopathy LUNGS:  Clear to auscultation bilaterally CHEST:  Unremarkable HEART:  RRR,  PMI not displaced or sustained,S1 and S2 within normal limits, no S3, no S4: no clicks, no rubs, no murmurs ABD:  Soft, nontender. BS +, no masses or bruits. No hepatomegaly, no splenomegaly EXT:  2 + pulses throughout, no edema, no cyanosis no clubbing SKIN:  Warm and dry.  No rashes NEURO:  Alert and oriented x 3. Cranial nerves II through XII intact. PSYCH:  Cognitively intact      LABORATORY DATA: Lab Results  Component Value Date   WBC 5.1 10/14/2018   HGB 14.8 10/14/2018   HCT 42.6 10/14/2018   PLT 284 10/14/2018   GLUCOSE 93 01/16/2019   CHOL 159 07/18/2019   TRIG 136 07/18/2019   HDL 55 07/18/2019   LDLDIRECT 224.3 06/10/2011   LDLCALC 80 07/18/2019   ALT 20 07/18/2019   AST 14 07/18/2019   NA 146 (H) 01/16/2019   K 4.8 01/16/2019   CL 107 (H) 01/16/2019   CREATININE 0.95 01/16/2019   BUN 17 01/16/2019   CO2 24 01/16/2019   TSH 1.40 06/10/2011   INR 0.84 02/01/2014   HGBA1C  06/26/2009    5.8 (NOTE) The ADA recommends the following therapeutic goal for glycemic control related to Hgb A1c measurement: Goal of therapy: <6.5 Hgb A1c  Reference: American Diabetes Association: Clinical Practice Recommendations 2010, Diabetes Care, 2010, 33: (Suppl  1).   Labs dated 07/22/17: cholesterol 226, triglycerides 157, HDL 57, LDL 157. TSH 8.29. CMET normal. CBC normal.  Myoview 07/02/15: Study Highlights    The left ventricular ejection fraction is hyperdynamic (>65%).  Nuclear stress EF: 68%.  There was no ST segment deviation noted during stress.  The study is normal.   Normal stress nuclear study with no ischemia or infarction; EF 68 with normal wall motion.    Assessment / Plan: 1. Hypertension, now under excellent control. In  fact may be too good. Will reduce olmesartan to 20 mg daily   2. Hypercholesterolemia. She has severe familial hypercholesterolemia with prior LDL cholesterol 196. Now on high dose lipitor and tolerating well. LDL down to 87.   3. Palpitations. She has a history of PACs and short burst of PACs on previous monitor. We will continue with metoprolol.  4.  Coronary calcification noted on CT.She does have heavy calcification in the LAD territory. Recommend she take ASA 81 mg daily. Myoview shows no ischemia. Risk factor modification  5. Aortic ectasia. Stable. Focus on BP control  6. Hives. Appears to have been related to ACEi. Now resolved.  Follow up in 6 months

## 2020-02-14 ENCOUNTER — Ambulatory Visit: Payer: 59 | Admitting: Cardiology

## 2020-03-12 ENCOUNTER — Other Ambulatory Visit: Payer: Self-pay

## 2020-03-12 MED ORDER — ATORVASTATIN CALCIUM 80 MG PO TABS: 80.0000 mg | ORAL_TABLET | Freq: Every day | ORAL | 2 refills | Status: DC

## 2020-03-14 ENCOUNTER — Other Ambulatory Visit: Payer: Self-pay

## 2020-03-19 ENCOUNTER — Other Ambulatory Visit: Payer: Self-pay

## 2020-05-14 ENCOUNTER — Telehealth: Payer: Medicare Other | Admitting: Cardiology

## 2020-05-21 ENCOUNTER — Other Ambulatory Visit: Payer: Self-pay

## 2020-05-21 ENCOUNTER — Encounter: Payer: Self-pay | Admitting: Vascular Surgery

## 2020-05-21 ENCOUNTER — Ambulatory Visit: Payer: Medicare Other | Admitting: Vascular Surgery

## 2020-05-21 VITALS — BP 112/76 | HR 55 | Temp 98.2°F | Ht 67.0 in | Wt 151.0 lb

## 2020-05-21 DIAGNOSIS — R55 Syncope and collapse: Secondary | ICD-10-CM

## 2020-05-21 NOTE — Progress Notes (Signed)
[facility] [place]  Patient name: Karla Chavez MRN: [id number] DOB: 29-Aug-1935 Sex: female  REASON FOR CONSULT: Evaluation syncopal episodes and possible vertebral occlusive disease.  HPI: Karla Chavez is a 85 y.o. female, who is here today for evaluation.  She is here with her son.  She has several month history of severe syncope.  This can occur when rising but also can occur while she is sitting still.  This has generated several trips to the emergency department.  She recently had a more thorough evaluation with CT angio of her head and neck.  I do not have her actual films.  I do have her report from this evaluation.  She specifically denies any prior focal neurologic deficits.  She denies aphasia or amaurosis fugax or transient ischemic attack or stroke.  She is right-handed.  She does report episodes of double vision.  Does have a history of left parotid resection for tumor Past Medical History:  Diagnosis Date  . Adenomatous colon polyp 03/2010  . Allergy   . Aortic aneurysm ([place])   . Arthritis    "joints" (03/01/2014)  . Family history of anesthesia complication    pts sister has severe nausea and vomiting  . GERD (gastroesophageal reflux disease)   . History of blood transfusion 1958   "related to childbirth"  . Hyperlipidemia   . Hypertension   . Near syncope   . Palpitations   . Pneumonia 06/2009  . PONV (postoperative nausea and vomiting)    "this OR" (03/01/2014)  . Vertigo    hx of occured on 01/01/14    Family History  Problem Relation Age of Onset  . Hypertension Mother   . Stroke Mother 56  . Heart disease Father 20       massive MI  . Heart disease Sister 22       CABG  . Heart disease Brother 11       CABG    SOCIAL HISTORY: Social History   Socioeconomic History  . Marital status: Married    Spouse name: Not on file  . Number of children: 4  . Years of education: Not on file  .  Highest education level: Not on file  Occupational History  . Occupation: retired  Tobacco Use  . Smoking status: Never Smoker  . Smokeless tobacco: Never Used  Substance and Sexual Activity  . Alcohol use: No  . Drug use: No  . Sexual activity: Never  Other Topics Concern  . Not on file  Social History Narrative  . Not on file   Social Determinants of Health   Financial Resource Strain:   . Difficulty of Paying Living Expenses:   Food Insecurity:   . Worried About Charity fundraiser in the Last Year:   . Arboriculturist in the Last Year:   Transportation Needs:   . Film/video editor (Medical):   Marland Kitchen Lack of Transportation (Non-Medical):   Physical Activity:   . Days of Exercise per Week:   . Minutes of Exercise per Session:   Stress:   . Feeling of Stress :   Social Connections:   . Frequency of Communication with Friends and Family:   . Frequency of Social Gatherings with Friends and Family:   . Attends Religious Services:   . Active Member of Clubs or Organizations:   . Attends Archivist Meetings:   Marland Kitchen Marital Status:   Intimate Partner Violence:   .  Fear of Current or Ex-Partner:   . Emotionally Abused:   Marland Kitchen Physically Abused:   . Sexually Abused:     Allergies  Allergen Reactions  . Amoxicillin     Yeast infection  . Benazepril Hcl Hives    Current Outpatient Medications  Medication Sig Dispense Refill  . ALPRAZolam (XANAX) 0.5 MG tablet Take 0.5 mg by mouth at bedtime as needed for anxiety.    Marland Kitchen amLODipine (NORVASC) 5 MG tablet Take 1 tablet (5 mg total) by mouth daily. 30 tablet 0  . aspirin EC 81 MG tablet Take 1 tablet (81 mg total) by mouth daily. 90 tablet 3  . atorvastatin (LIPITOR) 80 MG tablet Take 1 tablet (80 mg total) by mouth daily. 90 tablet 2  . ibuprofen (ADVIL,MOTRIN) 200 MG tablet Take 200 mg by mouth every 6 (six) hours as needed.    Marland Kitchen levocetirizine (XYZAL) 5 MG tablet Take 10 mg by mouth in the morning and at bedtime.      . metoprolol tartrate (LOPRESSOR) 25 MG tablet Take 1 tablet (25 mg total) by mouth 2 (two) times daily. NEED OV. 60 tablet 0  . olmesartan (BENICAR) 20 MG tablet TAKE 1 TABLET BY MOUTH EVERY DAY 90 tablet 1  . zolpidem (AMBIEN) 5 MG tablet Take 5 mg by mouth at bedtime as needed for sleep.    . citalopram (CELEXA) 10 MG tablet Take 5 mg by mouth daily.  (Patient not taking: Reported on 05/21/2020)    . loratadine (CLARITIN) 10 MG tablet as needed. (Patient not taking: Reported on 05/21/2020)     No current facility-administered medications for this visit.    REVIEW OF SYSTEMS:  [X]  denotes positive finding, [ ]  denotes negative finding Cardiac  Comments:  Chest pain or chest pressure:    Shortness of breath upon exertion:    Short of breath when lying flat:    Irregular heart rhythm:        Vascular    Pain in calf, thigh, or hip brought on by ambulation:    Pain in feet at night that wakes you up from your sleep:     Blood clot in your veins:    Leg swelling:         Pulmonary    Oxygen at home:    Productive cough:     Wheezing:         Neurologic    Sudden weakness in arms or legs:     Sudden numbness in arms or legs:     Sudden onset of difficulty speaking or slurred speech:    Temporary loss of vision in one eye:     Problems with dizziness:  x       Gastrointestinal    Blood in stool:     Vomited blood:         Genitourinary    Burning when urinating:     Blood in urine:        Psychiatric    Major depression:         Hematologic    Bleeding problems:    Problems with blood clotting too easily:        Skin    Rashes or ulcers:        Constitutional    Fever or chills:      PHYSICAL EXAM: Vitals:   05/21/20 1057  BP: 112/76  Pulse: (!) 55  Temp: 98.2 F (36.8 C)  SpO2: 97%  Weight: 151  lb (68.5 kg)  Height: 5\' 7"  (1.702 m)    GENERAL: The patient is a well-nourished female, in no acute distress. The vital signs are documented  above. CARDIOVASCULAR: Carotid arteries without bruits bilaterally.  2+ radial and 2+ dorsalis pedis pulses bilaterally PULMONARY: There is good air exchange  ABDOMEN: Soft and non-tender  MUSCULOSKELETAL: There are no major deformities or cyanosis. NEUROLOGIC: No focal weakness or paresthesias are detected. SKIN: There are no ulcers or rashes noted. PSYCHIATRIC: The patient has a normal affect.  DATA:  Report of CT angiogram of neck and head reviewed and discussed at length with the patient.  The report says the there is no evidence of stenosis at the vertebral artery origin or through the cervical vertebral artery.  There is no significant narrowing in the carotid arteries bilaterally in the cervical portion.  There is a great deal of discussion and the report regarding branching patterns of the circle of Willis with some potential for posterior ischemia.  MEDICAL ISSUES: Had long discussion with the patient regarding this.  Explained that from the standpoint of her report she does not have any surgically correctable evidence of vertebral artery stenosis.  I have recommended neurology consultation as suggested no further work-up of her severe dizziness.  I did explain that if indeed she did have intracranial disease that this would not be surgical treatment but would potentially be further evaluated with neuro interventional radiology.  She will see [place] again on an as-needed basis   Rosetta Posner, MD [facility] Tel [phone] Pager [phone]

## 2020-07-12 ENCOUNTER — Other Ambulatory Visit: Payer: Self-pay | Admitting: Cardiology

## 2020-09-16 NOTE — Progress Notes (Signed)
Cardiology Clinic Note   Patient Name: Karla Chavez Date of Encounter: 09/18/2020  Primary Care Provider:  Aletha Halim., PA-C Primary Cardiologist:  Peter Martinique, MD  Patient Profile    Karla Chavez 85 year old female presents the clinic today for follow-up evaluation of her HTN, near syncope and HLD.  Past Medical History    Past Medical History:  Diagnosis Date   Adenomatous colon polyp 03/2010   Allergy    Aortic aneurysm ([place])    Arthritis    "joints" (03/01/2014)   Family history of anesthesia complication    pts sister has severe nausea and vomiting   GERD (gastroesophageal reflux disease)    History of blood transfusion 1958   "related to childbirth"   Hyperlipidemia    Hypertension    Near syncope    Palpitations    Pneumonia 06/2009   PONV (postoperative nausea and vomiting)    "this OR" (03/01/2014)   Vertigo    hx of occured on 01/01/14   Past Surgical History:  Procedure Laterality Date   [place] TEST  01/09/2008   EF 82%   CERVICAL [place]E BIOPSY  1970's   COLONOSCOPY     PANENDOSCOPY     PAROTIDECTOMY Left 03/01/2104   superficial with facial nerve preservation    PAROTIDECTOMY Left 03/01/2014   Procedure: LEFT SUPERFICIAL  VS TOTAL PAROTIDECTOMY;  Surgeon: Jodi Marble, MD;  Location: [facility];  Service: ENT;  Laterality: Left;   TUBAL LIGATION  1972   tumor in neck  2015    Allergies  Allergies  Allergen Reactions   Amoxicillin     Yeast infection   Benazepril Hcl Hives    History of Present Illness    [name] has a PMH of hypertension, near syncope, premature atrial contraction, coronary artery calcification, aortic aneurysm, HLD, palpitations, vertigo, and insomnia.  She had a normal nuclear stress test 2009 in the bulbar echo in 2012.  She  reported compliance with her medications.  She had quit taking her Lipitor and was taking a heart saver supplement.  Her labs  were repeated and her LDL was 196.  It was recommended that her Lipitor be resumed.  A coronary CTA showed coronary calcification.  Repeat nuclear stress test 8/16 was normal.  A CT showed aortic ectasia.  3.8 cm a sending aorta.  No change was seen from 2015-2018.  She has a history of hives x4 years.  Her hives resolved once he switched from Benzapril to an ARB.  When she was last seen by Dr. Martinique 07/18/2019 she was doing well.  She did report some fatigue in the morning and felt her blood pressure was too low at times.  She reported blood pressures down to 90s over 50s.  She denied chest pain, dyspnea, palpitations, and edema.  She presents to the clinic today for follow-up evaluation states she had an episode this summer 7/21 where she was admitted to the hospital and diagnosed with vertebral artery stenosis.  She was placed on clopidogrel at that time.  She is also gone back on her citalopram for anxiety.  She states during her summer admission she noticed a warm sensation across her right chest, had dizziness, and had vision changes.  She was [facility] to [facility] and thoroughly evaluated.  She has not had any further episodes since that time.  We reviewed her aortic aneurysm.  She states that since her summer hospital admission her blood  pressure has been well controlled and she continues to monitor it.  It ranges in the 120s over 60s.  I will repeat a fasting lipid panel, give her the salty 6 diet sheet, and have her follow-up in 12 months.  Today she denies chest pain, shortness of breath, lower extremity edema, fatigue, palpitations, melena, hematuria, hemoptysis, diaphoresis, weakness, presyncope, syncope, orthopnea, and PND.      Home Medications    Prior to Admission medications   Medication Sig Start Date End Date Taking? Authorizing Provider  ALPRAZolam Duanne Moron) 0.5 MG tablet Take 0.5 mg by mouth at bedtime as needed for anxiety.    [provider]  amLODipine (NORVASC) 5  MG tablet Take 1 tablet (5 mg total) by mouth daily. 10/04/17   Martinique, Peter M, MD  aspirin EC 81 MG tablet Take 1 tablet (81 mg total) by mouth daily. 10/14/18   Martinique, Peter M, MD  atorvastatin (LIPITOR) 80 MG tablet Take 1 tablet (80 mg total) by mouth daily. 03/12/20   Martinique, Peter M, MD  citalopram (CELEXA) 10 MG tablet Take 5 mg by mouth daily.  Patient not taking: Reported on 05/21/2020    [provider]  ibuprofen (ADVIL,MOTRIN) 200 MG tablet Take 200 mg by mouth every 6 (six) hours as needed.    [provider]  levocetirizine (XYZAL) 5 MG tablet Take 10 mg by mouth in the morning and at bedtime.    [provider]  loratadine (CLARITIN) 10 MG tablet as needed. Patient not taking: Reported on 05/21/2020 10/30/18   [provider]  metoprolol tartrate (LOPRESSOR) 25 MG tablet Take 1 tablet (25 mg total) by mouth 2 (two) times daily. NEED OV. 01/03/18   Martinique, Peter M, MD  olmesartan (BENICAR) 20 MG tablet TAKE 1 TABLET BY MOUTH EVERY DAY 07/15/20   Martinique, Peter M, MD  zolpidem (AMBIEN) 5 MG tablet Take 5 mg by mouth at bedtime as needed for sleep. 07/28/16   [provider]    Family History    Family History  Problem Relation Age of Onset   Hypertension Mother    Stroke Mother 69   Heart disease Father 72       massive MI   Heart disease Sister 62       CABG   Heart disease Brother 56       CABG   She indicated that her mother is deceased. She indicated that her father is deceased. She indicated that two of her three sisters are alive. She indicated that two of her three brothers are alive.  Social History    Social History   Socioeconomic History   Marital status: Married    Spouse name: Not on file   Number of children: 4   Years of education: Not on file   Highest education level: Not on file  Occupational History   Occupation: retired  Tobacco Use   Smoking status: Never Smoker   Smokeless tobacco: Never  Used  Substance and Sexual Activity   Alcohol use: No   Drug use: No   Sexual activity: Never  Other Topics Concern   Not on file  Social History Narrative   Not on file   Social Determinants of Health   Financial Resource Strain:    Difficulty of Paying Living Expenses: Not on file  Food Insecurity:    Worried About [place] in the Last Year: Not on file   [employer] of Food in  the Last Year: Not on file  Transportation Needs:    Lack of Transportation (Medical): Not on file   Lack of Transportation (Non-Medical): Not on file  Physical Activity:    Days of Exercise per Week: Not on file   Minutes of Exercise per Session: Not on file  Stress:    Feeling of Stress : Not on file  Social Connections:    Frequency of Communication with Friends and Family: Not on file   Frequency of Social Gatherings with Friends and Family: Not on file   Attends Religious Services: Not on file   Active Member of Clubs or Organizations: Not on file   Attends Archivist Meetings: Not on file   Marital Status: Not on file  Intimate Partner Violence:    Fear of Current or Ex-Partner: Not on file   Emotionally Abused: Not on file   Physically Abused: Not on file   Sexually Abused: Not on file     Review of Systems    General:  No chills, fever, night sweats or weight changes.  Cardiovascular:  No chest pain, dyspnea on exertion, edema, orthopnea, palpitations, paroxysmal nocturnal dyspnea. Dermatological: No rash, lesions/masses Respiratory: No cough, dyspnea Urologic: No hematuria, dysuria Abdominal:   No nausea, vomiting, diarrhea, bright red blood per rectum, melena, or hematemesis Neurologic:  No visual changes, wkns, changes in mental status. All other systems reviewed and are otherwise negative except as noted above.  Physical Exam    VS:  BP 129/66    Pulse (!) 51    Ht 5\' 7"  (1.702 m)    Wt 154 lb 6.4 oz (70 kg)    LMP  (LMP Unknown)    SpO2  99%    BMI 24.18 kg/m  , BMI Body mass index is 24.18 kg/m. GEN: Well nourished, well developed, in no acute distress. HEENT: normal. Neck: Supple, no JVD, carotid bruits, or masses. Cardiac: Sinus bradycardia, no murmurs, rubs, or gallops. No clubbing, cyanosis, edema.  Radials/DP/PT 2+ and equal bilaterally.  Respiratory:  Respirations regular and unlabored, clear to auscultation bilaterally. GI: Soft, nontender, nondistended, BS + x 4. MS: no deformity or atrophy. Skin: warm and dry, no rash. Neuro:  Strength and sensation are intact. Psych: Normal affect.  Accessory Clinical Findings    Recent Labs: No results found for requested labs within last 8760 hours.   Recent Lipid Panel    Component Value Date/Time   CHOL 159 07/18/2019 1229   TRIG 136 07/18/2019 1229   HDL 55 07/18/2019 1229   CHOLHDL 2.9 07/18/2019 1229   CHOLHDL 2.9 06/13/2015 0937   VLDL 23 06/13/2015 0937   LDLCALC 80 07/18/2019 1229   LDLDIRECT 224.3 06/10/2011 1031    ECG personally reviewed by me today-sinus bradycardia 51 bpm no acute changes.  Echocardiogram 06/10/2011 Study Conclusions   - Left ventricle: There may be some increase in LV filling pressure.  The cavity size was normal. Wall thickness was normal. The  estimated ejection fraction was 60%. Wall motion was normal; there  were no regional wall motion abnormalities. Doppler parameters are  consistent with abnormal left ventricular relaxation (grade 1  diastolic dysfunction).  - Aortic valve: Sclerosis without stenosis. Mild regurgitation.  - Aorta: The root is 50mm. The ascending aorta is larger at 62mm.  Nuclear stress test 07/02/2015  The left ventricular ejection fraction is hyperdynamic (>65%).  Nuclear stress EF: 68%.  There was no ST segment deviation noted during stress.  The study is  normal.   Normal stress nuclear study with no ischemia or infarction; EF 68 with normal wall motion.   CT angio chest  12/02/2015 IMPRESSION: 1. Stable 2 cm parenchymal opacity left lower lobe most consistent with scarring 2. Stable 3.8 cm ascending aorta aneurysm. Recommend annual imaging followup by CTA or MRA. This recommendation follows 2010 ACCF/AHA/AATS/ACR/ASA/SCA/SCAI/SIR/STS/SVM Guidelines for the Diagnosis and Management of Patients with Thoracic Aortic Disease. Circulation.2010; 121: W098-J191 3. Mild acute to subacute compression deformity of T12 new from prior study  MRI chest 01/08/2020 IMPRESSION: 1. Stable fusiform ectasia of the ascending thoracic aorta and proximal aspect of the aortic arch measuring 37 mm in diameter grossly unchanged compared to the 06/2009 examination. 2. Unchanged ill-defined left lower lobe heterogeneous opacities, suboptimally evaluated on MRI though favored to represent atelectasis or scarring.  Assessment & Plan   1.  Essential hypertension-BP today 129/66.  Well-controlled at home.  Olmesartan previously reduced to 20 mg daily Continue olmesartan, amlodipine, metoprolol Heart healthy low-sodium diet-salty 6 given Increase physical activity as tolerated  Hyperlipidemia-LDL 80 on 07/18/2019 Continue atorvastatin Heart healthy low-sodium diet-salty 6 given Increase physical activity as tolerated Repeat fasting lipid panel  Palpitations-denies recent episodes of increased heart rate or skipped beats.  History of PACs. Continue metoprolol Avoid triggers caffeine, chocolate, EtOH etc. Heart healthy low-sodium diet-salty 6 given Increase physical activity as tolerated Mindfulness stress reduction sheet given  Coronary calcification-denies recent episodes of chest pain, arm or neck discomfort.  Previously noted on CT with heavy calcification in LAD region. Continue aspirin, atorvastatin, metoprolol Heart healthy low-sodium diet-salty 6 given Increase physical activity as tolerated  Ascending aortic aneurysm-denies recent episodes of chest or back pain.   Previously noted to be 38 mm and was stable 2015-2018.  MRI of chest 01/08/2020 showed stable aneurysm. Continue blood pressure control Continue to monitor  Vertebral artery stenosis-thoroughly evaluated at [facility] summer 2021.  Placed on Plavix at that time.  Blood pressure is now under good control. Follows with neurology  Disposition: Follow-up with Dr. Martinique in 12 months.  Jossie Ng. Rio Taber NP-C    09/18/2020, 2:26 PM [address] Office [phone] Fax [phone]  Notice: This dictation was prepared with Dragon dictation along with smaller phrase technology. Any transcriptional errors that result from this process are unintentional and may not be corrected upon review.

## 2020-09-18 ENCOUNTER — Ambulatory Visit: Payer: Medicare Other | Admitting: General Practice

## 2020-09-18 ENCOUNTER — Encounter: Payer: Self-pay | Admitting: General Practice

## 2020-09-18 VITALS — BP 129/66 | HR 51 | Ht 67.0 in | Wt 154.4 lb

## 2020-09-18 DIAGNOSIS — I2510 Atherosclerotic heart disease of native coronary artery without angina pectoris: Secondary | ICD-10-CM | POA: Diagnosis not present

## 2020-09-18 DIAGNOSIS — I10 Essential (primary) hypertension: Secondary | ICD-10-CM

## 2020-09-18 DIAGNOSIS — Z79899 Other long term (current) drug therapy: Secondary | ICD-10-CM

## 2020-09-18 DIAGNOSIS — I7121 Aneurysm of the ascending aorta, without rupture: Secondary | ICD-10-CM

## 2020-09-18 DIAGNOSIS — R002 Palpitations: Secondary | ICD-10-CM

## 2020-09-18 DIAGNOSIS — E7800 Pure hypercholesterolemia, unspecified: Secondary | ICD-10-CM | POA: Diagnosis not present

## 2020-09-18 DIAGNOSIS — I712 Thoracic aortic aneurysm, without rupture: Secondary | ICD-10-CM

## 2020-09-18 DIAGNOSIS — I6509 Occlusion and stenosis of unspecified vertebral artery: Secondary | ICD-10-CM

## 2020-09-18 NOTE — Patient Instructions (Signed)
Medication Instructions:  The current medical regimen is effective;  continue present plan and medications as directed. Please refer to the Current Medication list given to you today.  *If you need a refill on your cardiac medications before your next appointment, please call your pharmacy*  Lab Work: Fasting lipid panel-ASAP If you have labs (blood work) drawn today and your tests are completely normal, you will receive your results only by:  [place] (if you have MyChart) OR A paper copy in the mail.  If you have any lab test that is abnormal or we need to change your treatment, we will call you to review the results. You may go to any [facility] that is convenient for you however, we do have a lab in our office that is able to assist you. You DO NOT need an appointment for our lab. The lab is open 8:00am and closes at 4:00pm. Lunch 12:45 - 1:45pm.  Special Instructions PLEASE SEE ATTACHED INCREASED FIBER DIET  PLEASE READ AND FOLLOW STRESS REDUCTION TIPS-ATTACHED  PLEASE READ AND FOLLOW SALTY 6-ATTACHED-1,800 mg daily  Follow-Up: Your next appointment:  12 month(s) In Person with Peter Martinique, MD OR IF UNAVAILABLE [place], FNP-C Please call our office 2 months in advance to schedule this appointment-CALL IN SEPT 2022 FOR NOV 2021   At [facility], you and your health needs are our priority.  As part of our continuing mission to provide you with exceptional heart care, we have created designated Provider Care Teams.  These Care Teams include your primary Cardiologist (physician) and Advanced Practice Providers (APPs -  Physician Assistants and Nurse Practitioners) who all work together to provide you with the care you need, when you need it.  We recommend signing up for the patient portal called "MyChart".  Sign up information is provided on this After Visit Summary.  MyChart is used to connect with patients for Virtual Visits (Telemedicine).  Patients are able to view lab/test  results, encounter notes, upcoming appointments, etc.  Non-urgent messages can be sent to your provider as well.   To learn more about what you can do with MyChart, go to [email or web address].         Mindfulness-Based Stress Reduction Mindfulness-based stress reduction (MBSR) is a program that helps people learn to practice mindfulness. Mindfulness is the practice of intentionally paying attention to the present moment. It can be learned and practiced through techniques such as education, breathing exercises, meditation, and yoga. MBSR includes several mindfulness techniques in one program. MBSR works best when you understand the treatment, are willing to try new things, and can commit to spending time practicing what you learn. MBSR training may include learning about:  How your emotions, thoughts, and reactions affect your body.  New ways to respond to things that cause negative thoughts to start (triggers).  How to notice your thoughts and let go of them.  Practicing awareness of everyday things that you normally do without thinking.  The techniques and goals of different types of meditation. What are the benefits of MBSR? MBSR can have many benefits, which include helping you to:  Develop self-awareness. This refers to knowing and understanding yourself.  Learn skills and attitudes that help you to participate in your own health care.  Learn new ways to care for yourself.  Be more accepting about how things are, and let things go.  Be less judgmental and approach things with an open mind.  Be patient with yourself and trust yourself more. MBSR has  also been shown to:  Reduce negative emotions, such as depression and anxiety.  Improve memory and focus.  Change how you sense and approach pain.  Boost your body's ability to fight infections.  Help you connect better with other people.  Improve your sense of well-being. Follow these instructions at home:   Find  a local in-person or online MBSR program.  Set aside some time regularly for mindfulness practice.  Find a mindfulness practice that works best for you. This may include one or more of the following: ? Meditation. Meditation involves focusing your mind on a certain thought or activity. ? Breathing awareness exercises. These help you to stay present by focusing on your breath. ? Body scan. For this practice, you lie down and pay attention to each part of your body from head to toe. You can identify tension and soreness and intentionally relax parts of your body. ? Yoga. Yoga involves stretching and breathing, and it can improve your ability to move and be flexible. It can also provide an experience of testing your body's limits, which can help you release stress. ? Mindful eating. This way of eating involves focusing on the taste, texture, color, and smell of each bite of food. Because this slows down eating and helps you feel full sooner, it can be an important part of a weight-loss plan.  Find a podcast or recording that provides guidance for breathing awareness, body scan, or meditation exercises. You can listen to these any time when you have a free moment to rest without distractions.  Follow your treatment plan as told by your health care provider. This may include taking regular medicines and making changes to your diet or lifestyle as recommended. How to practice mindfulness To do a basic awareness exercise:  Find a comfortable place to sit.  Pay attention to the present moment. Observe your thoughts, feelings, and surroundings just as they are.  Avoid placing judgment on yourself, your feelings, or your surroundings. Make note of any judgment that comes up, and let it go.  Your mind may wander, and that is okay. Make note of when your thoughts drift, and return your attention to the present moment. To do basic mindfulness meditation:  Find a comfortable place to sit. This may  include a stable chair or a firm floor cushion. ? Sit upright with your back straight. Let your arms fall next to your side with your hands resting on your legs. ? If sitting in a chair, rest your feet flat on the floor. ? If sitting on a cushion, cross your legs in front of you.  Keep your head in a neutral position with your chin dropped slightly. Relax your jaw and rest the tip of your tongue on the roof of your mouth. Drop your gaze to the floor. You can close your eyes if you like.  Breathe normally and pay attention to your breath. Feel the air moving in and out of your nose. Feel your belly expanding and relaxing with each breath.  Your mind may wander, and that is okay. Make note of when your thoughts drift, and return your attention to your breath.  Avoid placing judgment on yourself, your feelings, or your surroundings. Make note of any judgment or feelings that come up, let them go, and bring your attention back to your breath.  When you are ready, lift your gaze or open your eyes. Pay attention to how your body feels after the meditation. Where to find more information  You can find more information about MBSR from:  Your health care provider.  Community-based meditation centers or programs.  Programs offered near you. Summary  Mindfulness-based stress reduction (MBSR) is a program that teaches you how to intentionally pay attention to the present moment. It is used with other treatments to help you cope better with daily stress, emotions, and pain.  MBSR focuses on developing self-awareness, which allows you to respond to life stress without judgment or negative emotions.  MBSR programs may involve learning different mindfulness practices, such as breathing exercises, meditation, yoga, body scan, or mindful eating. Find a mindfulness practice that works best for you, and set aside time for it on a regular basis. This information is not intended to replace advice given to you  by your health care provider. Make sure you discuss any questions you have with your health care provider.  High-Fiber Diet Fiber, also called dietary fiber, is a type of carbohydrate that is found in fruits, vegetables, whole grains, and beans. A high-fiber diet can have many health benefits. Your health care provider may recommend a high-fiber diet to help:  Prevent constipation. Fiber can make your bowel movements more regular.  Lower your cholesterol.  Relieve the following conditions: ? Swelling of veins in the anus (hemorrhoids). ? Swelling and irritation (inflammation) of specific areas of the digestive tract (uncomplicated diverticulosis). ? A problem of the large intestine (colon) that sometimes causes pain and diarrhea (irritable bowel syndrome, IBS).  Prevent overeating as part of a weight-loss plan.  Prevent heart disease, type 2 diabetes, and certain cancers. What is my plan? The recommended daily fiber intake in grams (g) includes:  38 g for men age 66 or younger.  30 g for men over age 36.  31 g for women age 94 or younger.  21 g for women over age 79. You can get the recommended daily intake of dietary fiber by:  Eating a variety of fruits, vegetables, grains, and beans.  Taking a fiber supplement, if it is not possible to get enough fiber through your diet. What do I need to know about a high-fiber diet?  It is better to get fiber through food sources rather than from fiber supplements. There is not a lot of research about how effective supplements are.  Always check the fiber content on the nutrition facts label of any prepackaged food. Look for foods that contain 5 g of fiber or more per serving.  Talk with a diet and nutrition specialist (dietitian) if you have questions about specific foods that are recommended or not recommended for your medical condition, especially if those foods are not listed below.  Gradually increase how much fiber you consume. If  you increase your intake of dietary fiber too quickly, you may have bloating, cramping, or gas.  Drink plenty of water. Water helps you to digest fiber. What are tips for following this plan?  Eat a wide variety of high-fiber foods.  Make sure that half of the grains that you eat each day are whole grains.  Eat breads and cereals that are made with whole-grain flour instead of refined flour or white flour.  Eat brown rice, bulgur wheat, or millet instead of white rice.  Start the day with a breakfast that is high in fiber, such as a cereal that contains 5 g of fiber or more per serving.  Use beans in place of meat in soups, salads, and pasta dishes.  Eat high-fiber snacks, such as berries, raw  vegetables, nuts, and popcorn.  Choose whole fruits and vegetables instead of processed forms like juice or sauce. What foods can I eat?  Fruits Berries. Pears. Apples. Oranges. Avocado. Prunes and raisins. Dried figs. Vegetables Sweet potatoes. Spinach. Kale. Artichokes. Cabbage. Broccoli. Cauliflower. Green peas. Carrots. Squash. Grains Whole-grain breads. Multigrain cereal. Oats and oatmeal. Brown rice. Barley. Bulgur wheat. [place]. Quinoa. Bran muffins. Popcorn. Rye wafer crackers. Meats and other proteins Navy, kidney, and pinto beans. Soybeans. Split peas. Lentils. Nuts and seeds. Dairy Fiber-fortified yogurt. Beverages Fiber-fortified soy milk. Fiber-fortified orange juice. Other foods Fiber bars. The items listed above may not be a complete list of recommended foods and beverages. Contact a dietitian for more options. What foods are not recommended? Fruits Fruit juice. Cooked, strained fruit. Vegetables Fried potatoes. Canned vegetables. Well-cooked vegetables. Grains White bread. Pasta made with refined flour. White rice. Meats and other proteins Fatty cuts of meat. Fried chicken or fried fish. Dairy Milk. Yogurt. Cream cheese. Sour cream. Fats and  oils Butters. Beverages Soft drinks. Other foods Cakes and pastries. The items listed above may not be a complete list of foods and beverages to avoid. Contact a dietitian for more information. Summary  Fiber is a type of carbohydrate. It is found in fruits, vegetables, whole grains, and beans.  There are many health benefits of eating a high-fiber diet, such as preventing constipation, lowering blood cholesterol, helping with weight loss, and reducing your risk of heart disease, diabetes, and certain cancers.  Gradually increase your intake of fiber. Increasing too fast can result in cramping, bloating, and gas. Drink plenty of water while you increase your fiber.  The best sources of fiber include whole fruits and vegetables, whole grains, nuts, seeds, and beans. This information is not intended to replace advice given to you by your health care provider. Make sure you discuss any questions you have with your health care provider. Document Revised: 08/23/2017 Document Reviewed: 08/23/2017 Elsevier Patient Education  2020 [employer].

## 2021-01-18 ENCOUNTER — Other Ambulatory Visit: Payer: Self-pay | Admitting: Cardiology

## 2021-05-14 ENCOUNTER — Telehealth: Payer: Self-pay | Admitting: Nurse Practitioner

## 2021-05-14 NOTE — Telephone Encounter (Signed)
I spoke with Karla Chavez at Dr. Kelby Fam office.  She is advised that we do not perform liver bx here.  I have rescheduled her appointment to [facility] on 05/19/21 10:00.  She is provided the number to IR, Dr. Deatra Ina has ordered urgent liver bx.  She will fax the scan from ED yesterday.  Not available in Care Everywhere at this time.  Karla Chavez will notify the patient of the new appointment date and time.

## 2021-05-14 NOTE — Telephone Encounter (Signed)
Inbound call from Margarita Grizzle, at PCP. States patient went ER in [place]. Had a CT abdomen and was discovered she have metastatic lesions on her liver. If she could have liver biopsy or recommend. Best contact best contact [phone]

## 2021-05-16 ENCOUNTER — Other Ambulatory Visit (HOSPITAL_COMMUNITY): Payer: Self-pay | Admitting: Family Medicine

## 2021-05-16 DIAGNOSIS — R160 Hepatomegaly, not elsewhere classified: Secondary | ICD-10-CM

## 2021-05-18 ENCOUNTER — Encounter (HOSPITAL_COMMUNITY): Payer: Self-pay

## 2021-05-18 ENCOUNTER — Emergency Department (HOSPITAL_COMMUNITY): Payer: Medicare (Managed Care)

## 2021-05-18 ENCOUNTER — Inpatient Hospital Stay (HOSPITAL_COMMUNITY)
Admission: EM | Admit: 2021-05-18 | Discharge: 2021-06-02 | DRG: 181 | Disposition: A | Discharge status: Hospice | Payer: Medicare (Managed Care) | Attending: Internal Medicine | Admitting: Internal Medicine

## 2021-05-18 ENCOUNTER — Other Ambulatory Visit: Payer: Self-pay

## 2021-05-18 DIAGNOSIS — C7951 Secondary malignant neoplasm of bone: Secondary | ICD-10-CM | POA: Diagnosis present

## 2021-05-18 DIAGNOSIS — I491 Atrial premature depolarization: Secondary | ICD-10-CM | POA: Diagnosis not present

## 2021-05-18 DIAGNOSIS — R1013 Epigastric pain: Secondary | ICD-10-CM | POA: Diagnosis not present

## 2021-05-18 DIAGNOSIS — Z515 Encounter for palliative care: Secondary | ICD-10-CM

## 2021-05-18 DIAGNOSIS — E0590 Thyrotoxicosis, unspecified without thyrotoxic crisis or storm: Secondary | ICD-10-CM | POA: Diagnosis present

## 2021-05-18 DIAGNOSIS — Z7189 Other specified counseling: Secondary | ICD-10-CM

## 2021-05-18 DIAGNOSIS — Z7902 Long term (current) use of antithrombotics/antiplatelets: Secondary | ICD-10-CM | POA: Diagnosis not present

## 2021-05-18 DIAGNOSIS — I2510 Atherosclerotic heart disease of native coronary artery without angina pectoris: Secondary | ICD-10-CM | POA: Diagnosis present

## 2021-05-18 DIAGNOSIS — Z6822 Body mass index (BMI) 22.0-22.9, adult: Secondary | ICD-10-CM

## 2021-05-18 DIAGNOSIS — A040 Enteropathogenic Escherichia coli infection: Secondary | ICD-10-CM | POA: Diagnosis present

## 2021-05-18 DIAGNOSIS — E340 Carcinoid syndrome: Secondary | ICD-10-CM | POA: Diagnosis present

## 2021-05-18 DIAGNOSIS — I712 Thoracic aortic aneurysm, without rupture: Secondary | ICD-10-CM | POA: Diagnosis present

## 2021-05-18 DIAGNOSIS — R918 Other nonspecific abnormal finding of lung field: Secondary | ICD-10-CM

## 2021-05-18 DIAGNOSIS — Z86010 Personal history of colonic polyps: Secondary | ICD-10-CM | POA: Diagnosis not present

## 2021-05-18 DIAGNOSIS — E440 Moderate protein-calorie malnutrition: Secondary | ICD-10-CM | POA: Diagnosis present

## 2021-05-18 DIAGNOSIS — Z8249 Family history of ischemic heart disease and other diseases of the circulatory system: Secondary | ICD-10-CM | POA: Diagnosis not present

## 2021-05-18 DIAGNOSIS — R64 Cachexia: Secondary | ICD-10-CM | POA: Diagnosis present

## 2021-05-18 DIAGNOSIS — K219 Gastro-esophageal reflux disease without esophagitis: Secondary | ICD-10-CM | POA: Diagnosis present

## 2021-05-18 DIAGNOSIS — R638 Other symptoms and signs concerning food and fluid intake: Secondary | ICD-10-CM | POA: Diagnosis not present

## 2021-05-18 DIAGNOSIS — Z823 Family history of stroke: Secondary | ICD-10-CM

## 2021-05-18 DIAGNOSIS — Z9049 Acquired absence of other specified parts of digestive tract: Secondary | ICD-10-CM

## 2021-05-18 DIAGNOSIS — R531 Weakness: Secondary | ICD-10-CM | POA: Diagnosis not present

## 2021-05-18 DIAGNOSIS — R1900 Intra-abdominal and pelvic swelling, mass and lump, unspecified site: Secondary | ICD-10-CM | POA: Diagnosis present

## 2021-05-18 DIAGNOSIS — R109 Unspecified abdominal pain: Secondary | ICD-10-CM | POA: Diagnosis present

## 2021-05-18 DIAGNOSIS — K529 Noninfective gastroenteritis and colitis, unspecified: Secondary | ICD-10-CM | POA: Diagnosis not present

## 2021-05-18 DIAGNOSIS — C7A090 Malignant carcinoid tumor of the bronchus and lung: Secondary | ICD-10-CM | POA: Diagnosis present

## 2021-05-18 DIAGNOSIS — Z801 Family history of malignant neoplasm of trachea, bronchus and lung: Secondary | ICD-10-CM

## 2021-05-18 DIAGNOSIS — R000 Tachycardia, unspecified: Secondary | ICD-10-CM | POA: Diagnosis present

## 2021-05-18 DIAGNOSIS — C7B02 Secondary carcinoid tumors of liver: Secondary | ICD-10-CM | POA: Diagnosis present

## 2021-05-18 DIAGNOSIS — M899 Disorder of bone, unspecified: Secondary | ICD-10-CM | POA: Diagnosis not present

## 2021-05-18 DIAGNOSIS — R7989 Other specified abnormal findings of blood chemistry: Secondary | ICD-10-CM | POA: Diagnosis not present

## 2021-05-18 DIAGNOSIS — Z888 Allergy status to other drugs, medicaments and biological substances status: Secondary | ICD-10-CM

## 2021-05-18 DIAGNOSIS — Z20822 Contact with and (suspected) exposure to covid-19: Secondary | ICD-10-CM | POA: Diagnosis present

## 2021-05-18 DIAGNOSIS — Z79899 Other long term (current) drug therapy: Secondary | ICD-10-CM | POA: Diagnosis not present

## 2021-05-18 DIAGNOSIS — R634 Abnormal weight loss: Secondary | ICD-10-CM | POA: Diagnosis not present

## 2021-05-18 DIAGNOSIS — N281 Cyst of kidney, acquired: Secondary | ICD-10-CM | POA: Diagnosis present

## 2021-05-18 DIAGNOSIS — K859 Acute pancreatitis without necrosis or infection, unspecified: Secondary | ICD-10-CM

## 2021-05-18 DIAGNOSIS — F419 Anxiety disorder, unspecified: Secondary | ICD-10-CM | POA: Diagnosis not present

## 2021-05-18 DIAGNOSIS — E7800 Pure hypercholesterolemia, unspecified: Secondary | ICD-10-CM | POA: Diagnosis not present

## 2021-05-18 DIAGNOSIS — R160 Hepatomegaly, not elsewhere classified: Secondary | ICD-10-CM

## 2021-05-18 DIAGNOSIS — R627 Adult failure to thrive: Secondary | ICD-10-CM | POA: Diagnosis present

## 2021-05-18 DIAGNOSIS — I10 Essential (primary) hypertension: Secondary | ICD-10-CM | POA: Diagnosis present

## 2021-05-18 DIAGNOSIS — R008 Other abnormalities of heart beat: Secondary | ICD-10-CM | POA: Diagnosis not present

## 2021-05-18 DIAGNOSIS — R630 Anorexia: Secondary | ICD-10-CM | POA: Diagnosis not present

## 2021-05-18 DIAGNOSIS — Z66 Do not resuscitate: Secondary | ICD-10-CM | POA: Diagnosis not present

## 2021-05-18 DIAGNOSIS — E876 Hypokalemia: Secondary | ICD-10-CM | POA: Diagnosis not present

## 2021-05-18 DIAGNOSIS — E785 Hyperlipidemia, unspecified: Secondary | ICD-10-CM | POA: Diagnosis present

## 2021-05-18 DIAGNOSIS — A498 Other bacterial infections of unspecified site: Secondary | ICD-10-CM | POA: Diagnosis not present

## 2021-05-18 DIAGNOSIS — Z880 Allergy status to penicillin: Secondary | ICD-10-CM

## 2021-05-18 DIAGNOSIS — E039 Hypothyroidism, unspecified: Secondary | ICD-10-CM | POA: Diagnosis present

## 2021-05-18 DIAGNOSIS — F32A Depression, unspecified: Secondary | ICD-10-CM | POA: Diagnosis present

## 2021-05-18 DIAGNOSIS — E872 Acidosis: Secondary | ICD-10-CM | POA: Diagnosis not present

## 2021-05-18 DIAGNOSIS — R1901 Right upper quadrant abdominal swelling, mass and lump: Secondary | ICD-10-CM | POA: Diagnosis not present

## 2021-05-18 DIAGNOSIS — Z8049 Family history of malignant neoplasm of other genital organs: Secondary | ICD-10-CM

## 2021-05-18 DIAGNOSIS — K769 Liver disease, unspecified: Secondary | ICD-10-CM | POA: Diagnosis not present

## 2021-05-18 DIAGNOSIS — R1084 Generalized abdominal pain: Secondary | ICD-10-CM | POA: Diagnosis not present

## 2021-05-18 DIAGNOSIS — A09 Infectious gastroenteritis and colitis, unspecified: Secondary | ICD-10-CM | POA: Diagnosis not present

## 2021-05-18 DIAGNOSIS — R197 Diarrhea, unspecified: Secondary | ICD-10-CM | POA: Diagnosis not present

## 2021-05-18 LAB — URINALYSIS, ROUTINE W REFLEX MICROSCOPIC
Bacteria, UA: NONE SEEN
Bilirubin Urine: NEGATIVE
Glucose, UA: NEGATIVE mg/dL
Ketones, ur: NEGATIVE mg/dL
Leukocytes,Ua: NEGATIVE
Nitrite: NEGATIVE
Protein, ur: NEGATIVE mg/dL
Specific Gravity, Urine: 1.02 (ref 1.005–1.030)
pH: 6 (ref 5.0–8.0)

## 2021-05-18 LAB — COMPREHENSIVE METABOLIC PANEL
ALT: 23 U/L (ref 0–44)
AST: 45 U/L — ABNORMAL HIGH (ref 15–41)
Albumin: 3.3 g/dL — ABNORMAL LOW (ref 3.5–5.0)
Alkaline Phosphatase: 134 U/L — ABNORMAL HIGH (ref 38–126)
Anion gap: 11 (ref 5–15)
BUN: 20 mg/dL (ref 8–23)
CO2: 21 mmol/L — ABNORMAL LOW (ref 22–32)
Calcium: 9.4 mg/dL (ref 8.9–10.3)
Chloride: 107 mmol/L (ref 98–111)
Creatinine, Ser: 0.74 mg/dL (ref 0.44–1.00)
GFR, Estimated: >60 mL/min (ref >60)
Glucose, Bld: 121 mg/dL — ABNORMAL HIGH (ref 70–99)
Potassium: 3.5 mmol/L (ref 3.5–5.1)
Sodium: 139 mmol/L (ref 135–145)
Total Bilirubin: 0.7 mg/dL (ref 0.3–1.2)
Total Protein: 6.3 g/dL — ABNORMAL LOW (ref 6.5–8.1)

## 2021-05-18 LAB — CBC
HCT: 39.1 % (ref 36.0–46.0)
Hemoglobin: 13.2 g/dL (ref 12.0–15.0)
MCH: 30.9 pg (ref 26.0–34.0)
MCHC: 33.8 g/dL (ref 30.0–36.0)
MCV: 91.6 fL (ref 80.0–100.0)
Platelets: 295 10*3/uL (ref 150–400)
RBC: 4.27 MIL/uL (ref 3.87–5.11)
RDW: 11.8 % (ref 11.5–15.5)
WBC: 8.6 10*3/uL (ref 4.0–10.5)
nRBC: 0 % (ref 0.0–0.2)

## 2021-05-18 LAB — RESP PANEL BY RT-PCR (FLU A&B, COVID) ARPGX2
Influenza A by PCR: NEGATIVE
Influenza B by PCR: NEGATIVE
SARS Coronavirus 2 by RT PCR: NEGATIVE

## 2021-05-18 LAB — LIPASE, BLOOD: Lipase: 93 U/L — ABNORMAL HIGH (ref 11–51)

## 2021-05-18 MED ORDER — SODIUM CHLORIDE 0.9 % IV BOLUS
500.0000 mL | Freq: Once | INTRAVENOUS | Status: AC
  Administered 2021-05-18: 500 mL via INTRAVENOUS

## 2021-05-18 MED ORDER — IOHEXOL 350 MG/ML SOLN
80.0000 mL | Freq: Once | INTRAVENOUS | Status: AC | PRN
  Administered 2021-05-18: 80 mL via INTRAVENOUS

## 2021-05-18 NOTE — H&P (Signed)
History and Physical   Karla Chavez DXA:[id number] DOB: Jan 10, 1935 DOA: 05/18/2021  Referring MD/NP/PA: Dr. Jeanell Sparrow  PCP: Aletha Halim., PA-C   Outpatient Specialists: [facility]  Patient coming from: Home  Chief Complaint: Abdominal pain and diarrhea  HPI: Karla Chavez is a 86 y.o. female with medical history significant of GERD, hypertension, hyperlipidemia, history of vertigo, aortic aneurysm, osteoarthritis, status post appendectomy who apparently has been having persistent diarrhea on and off for over 2 months.  During this period she has lost appetite and lost a lot of weight.  Abdominal pain has been persistent.  Patient has diarrhea every time she tries to eat.  She has had multiple bowel movements a day.  She was evaluated by PCP and sent to see GI.  Initially she was seen by antibiotic [place].  CT scan was done that showed masses in the liver as well as in the abdomen.  Suspicion for metastatic disease at the time.  Plan was for patient to have some liver biopsy this week.  Patient's symptoms continue to get worse.  She has been weak.  The pain is now 8 out of 10 in the central abdomen going to her back.  Associated with some nausea but no vomiting.  Patient therefore came to the ER for further evaluation and treatment.  In the ER patient was noted to have multiple masses but also spiculated lung mass.  Primary is malignancy suspected.  At this point she is failing to thrive at home so patient being admitted to the hospital for evaluation and treatment..  ED Course: Temperature 98.2 blood pressure 140/77, pulse 101 respirate 25 and oxygen sats 93% on room air.  Chemistry showed CO2 21 glucose 121.  Alkaline phosphatase 134 albumin 3.3 lipase 93.  CBC entirely within normal.  Influenza and COVID-19 screen is negative.  Urinalysis negative.  CT chest as well as abdomen pelvis shows left lower lobe density extending to the pleural surface.  Interval change morphology of  some groundglass density in the left lower lobe solid and spiculated major about 2.7 cm.  This is concerning for lung carcinoma.  There are multiple ill-defined hypodense liver lesions concerning for metastatic disease.  5.6 cm complex lobulated cyst in the midpole left kidney.  Head CT without contrast is negative.  Patient being admitted for evaluation new malignancy  Review of Systems: As per HPI otherwise 10 point review of systems negative.    Past Medical History:  Diagnosis Date   Adenomatous colon polyp 03/2010   Allergy    Aortic aneurysm (Post)    Arthritis    "joints" (03/01/2014)   Family history of anesthesia complication    pts sister has severe nausea and vomiting   GERD (gastroesophageal reflux disease)    History of blood transfusion 1958   "related to childbirth"   Hyperlipidemia    Hypertension    Near syncope    Palpitations    Pneumonia 06/2009   PONV (postoperative nausea and vomiting)    "this OR" (03/01/2014)   Vertigo    hx of occured on 01/01/14    Past Surgical History:  Procedure Laterality Date   [place] TEST  01/09/2008   EF 82%   CERVICAL [place] BIOPSY  1970's   COLONOSCOPY     PANENDOSCOPY     PAROTIDECTOMY Left 03/01/2104   superficial with facial nerve preservation    PAROTIDECTOMY Left 03/01/2014   Procedure: LEFT SUPERFICIAL  VS TOTAL  PAROTIDECTOMY;  Surgeon: Jodi Marble, MD;  Location: [facility];  Service: ENT;  Laterality: Left;   [place]   tumor in neck  2015     reports that she has never smoked. She has never used smokeless tobacco. She reports that she does not drink alcohol and does not use drugs.  Allergies  Allergen Reactions   Amoxicillin     Yeast infection   Benazepril Hcl Hives    Family History  Problem Relation Age of Onset   Hypertension Mother    Stroke Mother 50   Heart disease Father 43       massive MI   Heart disease Sister 57       CABG   Heart disease Brother 37        CABG     Prior to Admission medications   Medication Sig Start Date End Date Taking? Authorizing Provider  ALPRAZolam Duanne Moron) 0.5 MG tablet Take 0.5 mg by mouth at bedtime as needed for anxiety.    [provider]  amLODipine (NORVASC) 5 MG tablet Take 1 tablet (5 mg total) by mouth daily. 10/04/17   [place], Peter M, MD  atorvastatin (LIPITOR) 80 MG tablet Take 1 tablet (80 mg total) by mouth daily. 03/12/20   [place], Peter M, MD  citalopram (CELEXA) 10 MG tablet Take 5 mg by mouth daily.     [provider]  citalopram (CELEXA) 10 MG tablet Take by mouth. 05/27/20   [provider]  clopidogrel (PLAVIX) 75 MG tablet Take by mouth. 09/12/20   [provider]  metoprolol tartrate (LOPRESSOR) 25 MG tablet Take 1 tablet (25 mg total) by mouth 2 (two) times daily. NEED OV. 01/03/18   [place], Peter M, MD  olmesartan (BENICAR) 20 MG tablet TAKE 1 TABLET BY MOUTH EVERY DAY 01/20/21   [place], Peter M, MD  vitamin B-12 (CYANOCOBALAMIN) 500 MCG tablet Take by mouth.    [provider]  zolpidem (AMBIEN) 5 MG tablet Take 5 mg by mouth at bedtime as needed for sleep. 07/28/16   [provider]    Physical Exam: Vitals:   05/18/21 2030 05/18/21 2100 05/18/21 2130 05/18/21 2240  BP: (!) 149/77 (!) 142/71 (!) 146/70 (!) 148/82  Pulse: 88 85 84 85  Resp: (!) 24 (!) 25 (!) 24 20  Temp:      TempSrc:      SpO2: 93% 96% 95% 94%      Constitutional: Chronically ill looking, cachectic  Vitals:   05/18/21 2030 05/18/21 2100 05/18/21 2130 05/18/21 2240  BP: (!) 149/77 (!) 142/71 (!) 146/70 (!) 148/82  Pulse: 88 85 84 85  Resp: (!) 24 (!) 25 (!) 24 20  Temp:      TempSrc:      SpO2: 93% 96% 95% 94%   Eyes: PERRL, lids and conjunctivae normal ENMT: Mucous membranes are dry. Posterior pharynx clear of any exudate or lesions.Normal dentition.  Neck: normal, supple, no masses, no thyromegaly Respiratory: clear to auscultation bilaterally, no wheezing,  no crackles. Normal respiratory effort. No accessory muscle use.  Cardiovascular: Regular rate and rhythm, no murmurs / rubs / gallops. No extremity edema. 2+ pedal pulses. No carotid bruits.  Abdomen: Mild abdominal distention with diffuse tenderness, palpable mass and hepatomegaly bowel sounds positive.  Musculoskeletal: no clubbing / cyanosis. No joint deformity upper and lower extremities. Good ROM, no contractures. Normal muscle tone.  Skin: no rashes, lesions, ulcers. No induration Neurologic: CN 2-12 grossly  intact. Sensation intact, DTR normal. Strength 5/5 in all 4.  Psychiatric: Normal judgment and insight. Alert and oriented x 3. Normal mood.     Labs on Admission: I have personally reviewed following labs and imaging studies  CBC: Recent Labs  Lab 05/18/21 2021  WBC 8.6  HGB 13.2  HCT 39.1  MCV 91.6  PLT 716   Basic Metabolic Panel: Recent Labs  Lab 05/18/21 2021  NA 139  K 3.5  CL 107  CO2 21*  GLUCOSE 121*  BUN 20  CREATININE 0.74  CALCIUM 9.4   GFR: CrCl cannot be calculated (Unknown ideal weight.). Liver Function Tests: Recent Labs  Lab 05/18/21 2021  AST 45*  ALT 23  ALKPHOS 134*  BILITOT 0.7  PROT 6.3*  ALBUMIN 3.3*   Recent Labs  Lab 05/18/21 2021  LIPASE 93*   No results for input(s): AMMONIA in the last 168 hours. Coagulation Profile: No results for input(s): INR, PROTIME in the last 168 hours. Cardiac Enzymes: No results for input(s): CKTOTAL, CKMB, CKMBINDEX, TROPONINI in the last 168 hours. BNP (last 3 results) No results for input(s): PROBNP in the last 8760 hours. HbA1C: No results for input(s): HGBA1C in the last 72 hours. CBG: No results for input(s): GLUCAP in the last 168 hours. Lipid Profile: No results for input(s): CHOL, HDL, LDLCALC, TRIG, CHOLHDL, LDLDIRECT in the last 72 hours. Thyroid Function Tests: No results for input(s): TSH, T4TOTAL, FREET4, T3FREE, THYROIDAB in the last 72 hours. Anemia Panel: No results  for input(s): VITAMINB12, FOLATE, FERRITIN, TIBC, IRON, RETICCTPCT in the last 72 hours. Urine analysis:    Component Value Date/Time   COLORURINE YELLOW 05/18/2021 2000   APPEARANCEUR CLEAR 05/18/2021 2000   LABSPEC 1.020 05/18/2021 2000   PHURINE 6.0 05/18/2021 2000   GLUCOSEU NEGATIVE 05/18/2021 2000   HGBUR MODERATE (A) 05/18/2021 2000   BILIRUBINUR NEGATIVE 05/18/2021 2000   KETONESUR NEGATIVE 05/18/2021 2000   PROTEINUR NEGATIVE 05/18/2021 2000   UROBILINOGEN 0.2 06/27/2009 0855   NITRITE NEGATIVE 05/18/2021 2000   LEUKOCYTESUR NEGATIVE 05/18/2021 2000   Sepsis Labs: @LABRCNTIP (procalcitonin:4,lacticidven:4) ) Recent Results (from the past 240 hour(s))  Resp Panel by RT-PCR (Flu A&B, Covid) Nasopharyngeal Swab     Status: None   Collection Time: 05/18/21  8:21 PM   Specimen: Nasopharyngeal Swab; Nasopharyngeal(NP) swabs in vial transport medium  Result Value Ref Range Status   SARS Coronavirus 2 by RT PCR NEGATIVE NEGATIVE Final    Comment: (NOTE) SARS-CoV-2 target nucleic acids are NOT DETECTED.  The SARS-CoV-2 RNA is generally detectable in upper respiratory specimens during the acute phase of infection. The lowest concentration of SARS-CoV-2 viral copies this assay can detect is 138 copies/mL. A negative result does not preclude SARS-Cov-2 infection and should not be used as the sole basis for treatment or other patient management decisions. A negative result may occur with  improper specimen collection/handling, submission of specimen other than nasopharyngeal swab, presence of viral mutation(s) within the areas targeted by this assay, and inadequate number of viral copies(<138 copies/mL). A negative result must be combined with clinical observations, patient history, and epidemiological information. The expected result is Negative.  Fact Sheet for Patients:  EntrepreneurPulse.com.au  Fact Sheet for Healthcare Providers:   IncredibleEmployment.be  This test is no t yet approved or cleared by the [place] FDA and  has been authorized for detection and/or diagnosis of SARS-CoV-2 by FDA under an Emergency Use Authorization (EUA). This EUA will remain  in effect (meaning this test  can be used) for the duration of the COVID-19 declaration under Section 564(b)(1) of the Act, 21 U.S.C.section 360bbb-3(b)(1), unless the authorization is terminated  or revoked sooner.       Influenza A by PCR NEGATIVE NEGATIVE Final   Influenza B by PCR NEGATIVE NEGATIVE Final    Comment: (NOTE) The Xpert Xpress SARS-CoV-2/FLU/RSV plus assay is intended as an aid in the diagnosis of influenza from Nasopharyngeal swab specimens and should not be used as a sole basis for treatment. Nasal washings and aspirates are unacceptable for Xpert Xpress SARS-CoV-2/FLU/RSV testing.  Fact Sheet for Patients: EntrepreneurPulse.com.au  Fact Sheet for Healthcare Providers: IncredibleEmployment.be  This test is not yet approved or cleared by the [place] FDA and has been authorized for detection and/or diagnosis of SARS-CoV-2 by FDA under an Emergency Use Authorization (EUA). This EUA will remain in effect (meaning this test can be used) for the duration of the COVID-19 declaration under Section 564(b)(1) of the Act, 21 U.S.C. section 360bbb-3(b)(1), unless the authorization is terminated or revoked.  Performed at [facility], [place] [address], [place], [place] [address]      Radiological Exams on Admission: CT Head Wo Contrast  Result Date: 05/18/2021 CLINICAL DATA:  Abdomen pain diarrhea EXAM: CT HEAD WITHOUT CONTRAST TECHNIQUE: Contiguous axial images were obtained from the base of the skull through the vertex without intravenous contrast. COMPARISON:  MRI brain 01/02/2014 FINDINGS: Brain: No acute territorial infarction, hemorrhage or intracranial  mass. Moderate atrophy. Cerebellar atrophy. Nonenlarged ventricles. Patchy white matter hypodensity consistent with chronic small vessel ischemic change Vascular: No hyperdense vessels.  Carotid vascular calcification Skull: Normal. Negative for fracture or focal lesion. Sinuses/Orbits: No acute finding. Other: None IMPRESSION: 1. No CT evidence for acute intracranial abnormality. 2. Cortical and cerebellar atrophy. Chronic small vessel ischemic changes of the white matter Electronically Signed   By: Donavan Foil M.D.   On: 05/18/2021 22:15   CT Chest W Contrast  Result Date: 05/18/2021 CLINICAL DATA:  GI issues poor p.o. intake abdomen pain EXAM: CT CHEST, ABDOMEN, AND PELVIS WITH CONTRAST TECHNIQUE: Multidetector CT imaging of the chest, abdomen and pelvis was performed following the standard protocol during bolus administration of intravenous contrast. CONTRAST:  72mL OMNIPAQUE IOHEXOL 350 MG/ML SOLN COMPARISON:  MRI 01/08/2020, CT 12/02/2015, ultrasound 12/22/2005 FINDINGS: CT CHEST FINDINGS Cardiovascular: Mild aortic atherosclerosis. No aneurysm. Coronary vascular calcification. Normal cardiac size. No pericardial effusion Mediastinum/Nodes: Midline trachea. Subcentimeter left thyroid nodule, no further workup recommended based on age of patient and size of lesion. No suspicious adenopathy. Esophagus within normal limits Lungs/Pleura: Biapical pleural and parenchymal scarring. Bandlike density in the left lower lobe extending to the pleural surface as noted on previous and suggestive of scarring. Interval change in morphology of sub solid area in the superior left lower lobe, now appears more solid and spiculated, this measures 2.7 cm AP x 1.5 cm transverse by 2.4 cm craniocaudad. Given interval change in morphology, findings are concerning for lung carcinoma. Musculoskeletal: Sternum is intact.  No suspicious bone lesion. CT ABDOMEN PELVIS FINDINGS Hepatobiliary: Ill-defined multiple hypodense liver  nodules. No calcified gallstone or biliary dilatation Pancreas: Unremarkable. No pancreatic ductal dilatation or surrounding inflammatory changes. Spleen: Normal in size without focal abnormality. Adrenals/Urinary Tract: Adrenal glands are normal. Kidneys show no hydronephrosis. Subcentimeter cortical hypodense renal lesions, too small to further characterize. 5.6 by 4 by 4 cm complex lobulated cyst in the left kidney containing septations and calcification. Urinary bladder is unremarkable. Stomach/Bowel: The stomach is  nonenlarged. No dilated small bowel. No acute bowel wall thickening. The appendix is not well visualized. Scattered diverticular disease. Vascular/Lymphatic: Moderate aortic atherosclerosis. No aneurysm. No suspicious nodes Reproductive: Uterus and bilateral adnexa are unremarkable. Other: Negative for free air or free fluid. Musculoskeletal: Degenerative changes of the spine. Small focal sclerosis at T12, possibly a small bone island. IMPRESSION: 1. Bandlike density in the left lower lobe that extends to the pleural surface, suggestive of scarring. Interval change in morphology of previously noted ground-glass density in the left lower lobe associated with the linear density, now appears more solid and spiculated, measuring up to 2.7 cm and concerning for lung carcinoma. Pulmonary consultation is recommended. 2. Multiple ill-defined hypodense liver lesions concerning for metastatic disease 3. 5.6 cm complex lobulated cyst mid pole left kidney. When the patient is clinically stable and able to follow directions and hold their breath (preferably as an outpatient) further evaluation with dedicated abdominal MRI should be considered. Electronically Signed   By: Donavan Foil M.D.   On: 05/18/2021 22:36   CT ABDOMEN PELVIS W CONTRAST  Result Date: 05/18/2021 CLINICAL DATA:  GI issues poor p.o. intake abdomen pain EXAM: CT CHEST, ABDOMEN, AND PELVIS WITH CONTRAST TECHNIQUE: Multidetector CT imaging of  the chest, abdomen and pelvis was performed following the standard protocol during bolus administration of intravenous contrast. CONTRAST:  47mL OMNIPAQUE IOHEXOL 350 MG/ML SOLN COMPARISON:  MRI 01/08/2020, CT 12/02/2015, ultrasound 12/22/2005 FINDINGS: CT CHEST FINDINGS Cardiovascular: Mild aortic atherosclerosis. No aneurysm. Coronary vascular calcification. Normal cardiac size. No pericardial effusion Mediastinum/Nodes: Midline trachea. Subcentimeter left thyroid nodule, no further workup recommended based on age of patient and size of lesion. No suspicious adenopathy. Esophagus within normal limits Lungs/Pleura: Biapical pleural and parenchymal scarring. Bandlike density in the left lower lobe extending to the pleural surface as noted on previous and suggestive of scarring. Interval change in morphology of sub solid area in the superior left lower lobe, now appears more solid and spiculated, this measures 2.7 cm AP x 1.5 cm transverse by 2.4 cm craniocaudad. Given interval change in morphology, findings are concerning for lung carcinoma. Musculoskeletal: Sternum is intact.  No suspicious bone lesion. CT ABDOMEN PELVIS FINDINGS Hepatobiliary: Ill-defined multiple hypodense liver nodules. No calcified gallstone or biliary dilatation Pancreas: Unremarkable. No pancreatic ductal dilatation or surrounding inflammatory changes. Spleen: Normal in size without focal abnormality. Adrenals/Urinary Tract: Adrenal glands are normal. Kidneys show no hydronephrosis. Subcentimeter cortical hypodense renal lesions, too small to further characterize. 5.6 by 4 by 4 cm complex lobulated cyst in the left kidney containing septations and calcification. Urinary bladder is unremarkable. Stomach/Bowel: The stomach is nonenlarged. No dilated small bowel. No acute bowel wall thickening. The appendix is not well visualized. Scattered diverticular disease. Vascular/Lymphatic: Moderate aortic atherosclerosis. No aneurysm. No suspicious  nodes Reproductive: Uterus and bilateral adnexa are unremarkable. Other: Negative for free air or free fluid. Musculoskeletal: Degenerative changes of the spine. Small focal sclerosis at T12, possibly a small bone island. IMPRESSION: 1. Bandlike density in the left lower lobe that extends to the pleural surface, suggestive of scarring. Interval change in morphology of previously noted ground-glass density in the left lower lobe associated with the linear density, now appears more solid and spiculated, measuring up to 2.7 cm and concerning for lung carcinoma. Pulmonary consultation is recommended. 2. Multiple ill-defined hypodense liver lesions concerning for metastatic disease 3. 5.6 cm complex lobulated cyst mid pole left kidney. When the patient is clinically stable and able to follow directions and hold their  breath (preferably as an outpatient) further evaluation with dedicated abdominal MRI should be considered. Electronically Signed   By: Donavan Foil M.D.   On: 05/18/2021 22:36    EKG: Independently reviewed.  Sinus rhythm no significant ST changes  Assessment/Plan Principal Problem:   Abdominal mass Active Problems:   Hypertension   Hyperlipidemia   Coronary artery calcification seen on CAT scan   Abdominal pain   Anxiety and depression     #1 multiple masses of the abdomen and lung: Suspected metastatic disease to the liver and other abdominal structures.  Based on CT findings today this could be primarily lungs.  Patient is not a smoker although she has secondary tobacco exposure from her husband.  She is having significant abdominal pain and failing to thrive with weight loss.  Patient will be admitted for supportive care.  Biopsy already scheduled sometime this week by GI.  May require lung biopsy as well.  We will consider IR consultation in the morning for percutaneous biopsy.  Pain control.  Oncology consult in the morning.  #2 essential hypertension: Noted patient to be on multiple  medications at home.  We will confirm med rec and resume home regimen.  #3 hyperlipidemia: Continue statin once confirmed.  #4 anxiety with depression: Again patient appears to be on multiple medications from home but med rec not done yet.  Once confirmed we will resume home regimen.  #5 adult failure to thrive: Secondary to malignancy most likely.  Continue monitoring.   DVT prophylaxis: Lovenox Code Status: Full code Family Communication: Husband at bedside Disposition Plan: Home Consults called: Oncology Admission status: Inpatient  Severity of Illness: The appropriate patient status for this patient is INPATIENT. Inpatient status is judged to be reasonable and necessary in order to provide the required intensity of service to ensure the patient's safety. The patient's presenting symptoms, physical exam findings, and initial radiographic and laboratory data in the context of their chronic comorbidities is felt to place them at high risk for further clinical deterioration. Furthermore, it is not anticipated that the patient will be medically stable for discharge from the hospital within 2 midnights of admission. The following factors support the patient status of inpatient.   " The patient's presenting symptoms include abdominal pain with cachexia. " The worrisome physical exam findings include cachexia with abdominal tenderness. " The initial radiographic and laboratory data are worrisome because of multiple masses lungs and abdomen. " The chronic co-morbidities include hypertension hyperlipidemia.   * I certify that at the point of admission it is my clinical judgment that the patient will require inpatient hospital care spanning beyond 2 midnights from the point of admission due to high intensity of service, high risk for further deterioration and high frequency of surveillance required.Barbette Merino MD [facility] Pager [phone]  If 7PM-7AM, please contact  night-coverage [email or web address] Password [facility]  05/18/2021, 11:59 PM

## 2021-05-18 NOTE — ED Notes (Signed)
Patient transported to CT 

## 2021-05-18 NOTE — ED Notes (Signed)
Patient's son is at bedside.

## 2021-05-18 NOTE — ED Provider Notes (Signed)
[facility] DEPT Provider Note   CSN: [id number] Arrival date & time: 05/18/21  1859     History Chief Complaint  Patient presents with   Abdominal Pain   Diarrhea   Failure To Thrive    Karla Chavez is a 86 y.o. female.  HPI 86 year old female presents today complaining of diarrhea for 1 to 2 months.  She has had weight loss over the past couple months.  She has abdominal pain.  She reports loose stools every time she tries to eat which is usually 3-4 times per day.  She denies any blood but stools are runny.  She has had decreased p.o. secondary to this.  She was seen at [facility] in [place] on July 12.  She had a CT scan at that time that showed some masses in her liver thought to be metastatic disease, and a left renal cyst.  She is scheduled to follow-up outpatient with oncology at droppage tomorrow.  Review in epic reveals that she is scheduled to have an appointment with Nicoletta Ba and has a liver biopsy ordered.  She reports that she is generally weak and having difficulty getting around the house due to her generalized weakness.   Patient reports that she was non-smoker but her husband was. She reports history of colonoscopy performed by Dr. Fuller Plan about 4 years ago at which time she reports that she had some polyps. Past Medical History:  Diagnosis Date   Adenomatous colon polyp 03/2010   Allergy    Aortic aneurysm ([place])    Arthritis    "joints" (03/01/2014)   Family history of anesthesia complication    pts sister has severe nausea and vomiting   GERD (gastroesophageal reflux disease)    History of blood transfusion 1958   "related to childbirth"   Hyperlipidemia    Hypertension    Near syncope    Palpitations    Pneumonia 06/2009   PONV (postoperative nausea and vomiting)    "this OR" (03/01/2014)   Vertigo    hx of occured on 01/01/14    Patient Active Problem List   Diagnosis Date Noted   Aortic aneurysm ([place]) 12/24/2015    Insomnia 12/24/2015   Coronary artery calcification seen on CAT scan 06/13/2015   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   Vertigo 01/03/2014   Parotid mass 01/03/2014   PAC (premature atrial contraction) 10/04/2012   Hypertension    Hyperlipidemia    Palpitations    Near syncope     Past Surgical History:  Procedure Laterality Date   APPENDECTOMY  1958   CARDIOVASCULAR STRESS TEST  01/09/2008   EF 82%   CERVICAL CONE BIOPSY  1970's   COLONOSCOPY     PANENDOSCOPY     PAROTIDECTOMY Left 03/01/2104   superficial with facial nerve preservation    PAROTIDECTOMY Left 03/01/2014   Procedure: LEFT SUPERFICIAL  VS TOTAL PAROTIDECTOMY;  Surgeon: Jodi Marble, MD;  Location: [place];  Service: ENT;  Laterality: Left;   [place]   tumor in neck  2015     OB History   No obstetric history on file.     Family History  Problem Relation Age of Onset   Hypertension Mother    Stroke Mother 5   Heart disease Father 103       massive MI   Heart disease Sister 63       CABG   Heart disease Brother 62  CABG    Social History   Tobacco Use   Smoking status: Never   Smokeless tobacco: Never  Substance Use Topics   Alcohol use: No   Drug use: No    Home Medications Prior to Admission medications   Medication Sig Start Date End Date Taking? Authorizing Provider  ALPRAZolam Duanne Moron) 0.5 MG tablet Take 0.5 mg by mouth at bedtime as needed for anxiety.    [provider]  amLODipine (NORVASC) 5 MG tablet Take 1 tablet (5 mg total) by mouth daily. 10/04/17   Martinique, Peter M, MD  atorvastatin (LIPITOR) 80 MG tablet Take 1 tablet (80 mg total) by mouth daily. 03/12/20   Martinique, Peter M, MD  citalopram (CELEXA) 10 MG tablet Take 5 mg by mouth daily.     [provider]  citalopram (CELEXA) 10 MG tablet Take by mouth. 05/27/20   [provider]  clopidogrel (PLAVIX) 75 MG tablet Take by mouth. 09/12/20   [provider]  metoprolol tartrate (LOPRESSOR) 25 MG tablet Take 1 tablet (25 mg total) by mouth 2 (two) times daily. NEED OV. 01/03/18   Martinique, Peter M, MD  olmesartan (BENICAR) 20 MG tablet TAKE 1 TABLET BY MOUTH EVERY DAY 01/20/21   Martinique, Peter M, MD  vitamin B-12 (CYANOCOBALAMIN) 500 MCG tablet Take by mouth.    [provider]  zolpidem (AMBIEN) 5 MG tablet Take 5 mg by mouth at bedtime as needed for sleep. 07/28/16   [provider]    Allergies    Amoxicillin and Benazepril hcl  Review of Systems   Review of Systems  All other systems reviewed and are negative.  Physical Exam Updated Vital Signs BP (!) 142/71   Pulse 85   Temp 98.2 F (36.8 C) (Oral)   Resp (!) 25   LMP  (LMP Unknown)   SpO2 96%   Physical Exam Vitals and nursing note reviewed.  Constitutional:      General: She is not in acute distress.    Appearance: She is well-developed. She is ill-appearing.  HENT:     Head: Normocephalic.     Mouth/Throat:     Mouth: Mucous membranes are moist.  Eyes:     Extraocular Movements: Extraocular movements intact.  Cardiovascular:     Rate and Rhythm: Normal rate and regular rhythm.     Heart sounds: Normal heart sounds.  Pulmonary:     Effort: Pulmonary effort is normal.     Breath sounds: Normal breath sounds.  Abdominal:     General: Abdomen is flat. Bowel sounds are normal.     Palpations: Abdomen is soft.     Tenderness: There is abdominal tenderness in the epigastric area.  Skin:    General: Skin is warm and dry.  Neurological:     Mental Status: She is alert.    ED Results / Procedures / Treatments   Labs (all labs ordered are listed, but only abnormal results are displayed) Labs Reviewed  RESP PANEL BY RT-PCR (FLU A&B, COVID) [id number]  CBC  COMPREHENSIVE METABOLIC PANEL  LIPASE, BLOOD  URINALYSIS, ROUTINE W REFLEX MICROSCOPIC    EKG EKG Interpretation  Date/Time:  Sunday May 18 2021 20:10:39 EDT Ventricular Rate:  92 PR  Interval:  142 QRS Duration: 88 QT Interval:  353 QTC Calculation: 437 R Axis:   -11 Text Interpretation: Sinus rhythm Borderline T wave abnormalities since last tracing no significant change Confirmed by Daleen Bo [phone]) on 05/18/2021 8:25:27 PM  Radiology CT Head Wo Contrast  Result Date: 05/18/2021 CLINICAL DATA:  Abdomen pain diarrhea EXAM: CT HEAD WITHOUT CONTRAST TECHNIQUE: Contiguous axial images were obtained from the base of the skull through the vertex without intravenous contrast. COMPARISON:  MRI brain 01/02/2014 FINDINGS: Brain: No acute territorial infarction, hemorrhage or intracranial mass. Moderate atrophy. Cerebellar atrophy. Nonenlarged ventricles. Patchy white matter hypodensity consistent with chronic small vessel ischemic change Vascular: No hyperdense vessels.  Carotid vascular calcification Skull: Normal. Negative for fracture or focal lesion. Sinuses/Orbits: No acute finding. Other: None IMPRESSION: 1. No CT evidence for acute intracranial abnormality. 2. Cortical and cerebellar atrophy. Chronic small vessel ischemic changes of the white matter Electronically Signed   By: Donavan Foil M.D.   On: 05/18/2021 22:15   CT Chest W Contrast  Result Date: 05/18/2021 CLINICAL DATA:  GI issues poor p.o. intake abdomen pain EXAM: CT CHEST, ABDOMEN, AND PELVIS WITH CONTRAST TECHNIQUE: Multidetector CT imaging of the chest, abdomen and pelvis was performed following the standard protocol during bolus administration of intravenous contrast. CONTRAST:  58mL OMNIPAQUE IOHEXOL 350 MG/ML SOLN COMPARISON:  MRI 01/08/2020, CT 12/02/2015, ultrasound 12/22/2005 FINDINGS: CT CHEST FINDINGS Cardiovascular: Mild aortic atherosclerosis. No aneurysm. Coronary vascular calcification. Normal cardiac size. No pericardial effusion Mediastinum/Nodes: Midline trachea. Subcentimeter left thyroid nodule, no further workup recommended based on age of patient and size of lesion. No suspicious adenopathy.  Esophagus within normal limits Lungs/Pleura: Biapical pleural and parenchymal scarring. Bandlike density in the left lower lobe extending to the pleural surface as noted on previous and suggestive of scarring. Interval change in morphology of sub solid area in the superior left lower lobe, now appears more solid and spiculated, this measures 2.7 cm AP x 1.5 cm transverse by 2.4 cm craniocaudad. Given interval change in morphology, findings are concerning for lung carcinoma. Musculoskeletal: Sternum is intact.  No suspicious bone lesion. CT ABDOMEN PELVIS FINDINGS Hepatobiliary: Ill-defined multiple hypodense liver nodules. No calcified gallstone or biliary dilatation Pancreas: Unremarkable. No pancreatic ductal dilatation or surrounding inflammatory changes. Spleen: Normal in size without focal abnormality. Adrenals/Urinary Tract: Adrenal glands are normal. Kidneys show no hydronephrosis. Subcentimeter cortical hypodense renal lesions, too small to further characterize. 5.6 by 4 by 4 cm complex lobulated cyst in the left kidney containing septations and calcification. Urinary bladder is unremarkable. Stomach/Bowel: The stomach is nonenlarged. No dilated small bowel. No acute bowel wall thickening. The appendix is not well visualized. Scattered diverticular disease. Vascular/Lymphatic: Moderate aortic atherosclerosis. No aneurysm. No suspicious nodes Reproductive: Uterus and bilateral adnexa are unremarkable. Other: Negative for free air or free fluid. Musculoskeletal: Degenerative changes of the spine. Small focal sclerosis at T12, possibly a small bone island. IMPRESSION: 1. Bandlike density in the left lower lobe that extends to the pleural surface, suggestive of scarring. Interval change in morphology of previously noted ground-glass density in the left lower lobe associated with the linear density, now appears more solid and spiculated, measuring up to 2.7 cm and concerning for lung carcinoma. Pulmonary  consultation is recommended. 2. Multiple ill-defined hypodense liver lesions concerning for metastatic disease 3. 5.6 cm complex lobulated cyst mid pole left kidney. When the patient is clinically stable and able to follow directions and hold their breath (preferably as an outpatient) further evaluation with dedicated abdominal MRI should be considered. Electronically Signed   By: Donavan Foil M.D.   On: 05/18/2021 22:36   CT ABDOMEN PELVIS W CONTRAST  Result Date: 05/18/2021 CLINICAL DATA:  GI issues poor p.o. intake abdomen pain EXAM:  CT CHEST, ABDOMEN, AND PELVIS WITH CONTRAST TECHNIQUE: Multidetector CT imaging of the chest, abdomen and pelvis was performed following the standard protocol during bolus administration of intravenous contrast. CONTRAST:  72mL OMNIPAQUE IOHEXOL 350 MG/ML SOLN COMPARISON:  MRI 01/08/2020, CT 12/02/2015, ultrasound 12/22/2005 FINDINGS: CT CHEST FINDINGS Cardiovascular: Mild aortic atherosclerosis. No aneurysm. Coronary vascular calcification. Normal cardiac size. No pericardial effusion Mediastinum/Nodes: Midline trachea. Subcentimeter left thyroid nodule, no further workup recommended based on age of patient and size of lesion. No suspicious adenopathy. Esophagus within normal limits Lungs/Pleura: Biapical pleural and parenchymal scarring. Bandlike density in the left lower lobe extending to the pleural surface as noted on previous and suggestive of scarring. Interval change in morphology of sub solid area in the superior left lower lobe, now appears more solid and spiculated, this measures 2.7 cm AP x 1.5 cm transverse by 2.4 cm craniocaudad. Given interval change in morphology, findings are concerning for lung carcinoma. Musculoskeletal: Sternum is intact.  No suspicious bone lesion. CT ABDOMEN PELVIS FINDINGS Hepatobiliary: Ill-defined multiple hypodense liver nodules. No calcified gallstone or biliary dilatation Pancreas: Unremarkable. No pancreatic ductal dilatation or  surrounding inflammatory changes. Spleen: Normal in size without focal abnormality. Adrenals/Urinary Tract: Adrenal glands are normal. Kidneys show no hydronephrosis. Subcentimeter cortical hypodense renal lesions, too small to further characterize. 5.6 by 4 by 4 cm complex lobulated cyst in the left kidney containing septations and calcification. Urinary bladder is unremarkable. Stomach/Bowel: The stomach is nonenlarged. No dilated small bowel. No acute bowel wall thickening. The appendix is not well visualized. Scattered diverticular disease. Vascular/Lymphatic: Moderate aortic atherosclerosis. No aneurysm. No suspicious nodes Reproductive: Uterus and bilateral adnexa are unremarkable. Other: Negative for free air or free fluid. Musculoskeletal: Degenerative changes of the spine. Small focal sclerosis at T12, possibly a small bone island. IMPRESSION: 1. Bandlike density in the left lower lobe that extends to the pleural surface, suggestive of scarring. Interval change in morphology of previously noted ground-glass density in the left lower lobe associated with the linear density, now appears more solid and spiculated, measuring up to 2.7 cm and concerning for lung carcinoma. Pulmonary consultation is recommended. 2. Multiple ill-defined hypodense liver lesions concerning for metastatic disease 3. 5.6 cm complex lobulated cyst mid pole left kidney. When the patient is clinically stable and able to follow directions and hold their breath (preferably as an outpatient) further evaluation with dedicated abdominal MRI should be considered. Electronically Signed   By: Donavan Foil M.D.   On: 05/18/2021 22:36    Procedures Procedures   Medications Ordered in ED Medications  sodium chloride 0.9 % bolus 500 mL (500 mLs Intravenous New Bag/Given 05/18/21 2029)    ED Course  I have reviewed the triage vital signs and the nursing notes.  Pertinent labs & imaging results that were available during my care of the  patient were reviewed by me and considered in my medical decision making (see chart for details).  Clinical Course as of 05/18/21 2251  Nancy Fetter May 18, 2021  2250 CT ABDOMEN PELVIS W CONTRAST [DR]    Clinical Course User Index [DR] Pattricia Boss, MD   MDM Rules/Calculators/A&P                         86 year old female presents today with increasing weakness, abdominal pain, nausea, vomiting, and diarrhea.  Patient was seen and evaluated at [facility] with a CT scan of the abdomen and approximately 5 days ago.  There is noted to be  liver mets and possible left adrenal cyst.  Today she has increased epigastric pain and labs show elevated lipase consistent with pancreatitis.  CT scan of the head, chest, abdomen, pelvis obtained revealed no evidence of abnormality on head CT, however CT of the chest is significant for spiculated mass in the left lower lobe.  CT of the abdomen and chest showed the liver lesions and the renal cyst. Plan admission for hydration, pain control, and further evaluation of lung mass and liver lesions. Discussed with Dr. Jonelle Sidle who will see for admission  Final Clinical Impression(s) / ED Diagnoses Final diagnoses:  Acute pancreatitis, unspecified complication status, unspecified pancreatitis type  Lung mass  Liver masses    Rx / DC Orders ED Discharge Orders     None        Pattricia Boss, MD 05/18/21 2307

## 2021-05-18 NOTE — ED Triage Notes (Signed)
EMS reports from home, family states Pt has been having GI issues x 1 month, abdominal pain, diarrhea soon after eating, poor PO intake and stated masses "scattered throughout abdomin." Family also states Pt has had very little to eat or drink over past few days due to abdominal pain.  BP 116/70 HR 100 RR 16 Sp02 96 RA CBG 148  20ga LAC 153ml NS enroute.

## 2021-05-19 ENCOUNTER — Ambulatory Visit: Payer: Medicare (Managed Care) | Admitting: Physician Assistant

## 2021-05-19 ENCOUNTER — Encounter (HOSPITAL_COMMUNITY): Payer: Self-pay

## 2021-05-19 DIAGNOSIS — R918 Other nonspecific abnormal finding of lung field: Secondary | ICD-10-CM

## 2021-05-19 DIAGNOSIS — R197 Diarrhea, unspecified: Secondary | ICD-10-CM

## 2021-05-19 DIAGNOSIS — K529 Noninfective gastroenteritis and colitis, unspecified: Secondary | ICD-10-CM | POA: Diagnosis not present

## 2021-05-19 DIAGNOSIS — R160 Hepatomegaly, not elsewhere classified: Secondary | ICD-10-CM | POA: Diagnosis not present

## 2021-05-19 DIAGNOSIS — R634 Abnormal weight loss: Secondary | ICD-10-CM

## 2021-05-19 DIAGNOSIS — R1084 Generalized abdominal pain: Secondary | ICD-10-CM

## 2021-05-19 DIAGNOSIS — R638 Other symptoms and signs concerning food and fluid intake: Secondary | ICD-10-CM

## 2021-05-19 DIAGNOSIS — R1013 Epigastric pain: Secondary | ICD-10-CM

## 2021-05-19 DIAGNOSIS — K769 Liver disease, unspecified: Secondary | ICD-10-CM

## 2021-05-19 DIAGNOSIS — E876 Hypokalemia: Secondary | ICD-10-CM

## 2021-05-19 DIAGNOSIS — N281 Cyst of kidney, acquired: Secondary | ICD-10-CM

## 2021-05-19 DIAGNOSIS — I10 Essential (primary) hypertension: Secondary | ICD-10-CM | POA: Diagnosis not present

## 2021-05-19 LAB — COMPREHENSIVE METABOLIC PANEL
ALT: 21 U/L (ref 0–44)
AST: 40 U/L (ref 15–41)
Albumin: 3.1 g/dL — ABNORMAL LOW (ref 3.5–5.0)
Alkaline Phosphatase: 123 U/L (ref 38–126)
Anion gap: 10 (ref 5–15)
BUN: 15 mg/dL (ref 8–23)
CO2: 24 mmol/L (ref 22–32)
Calcium: 8.8 mg/dL — ABNORMAL LOW (ref 8.9–10.3)
Chloride: 108 mmol/L (ref 98–111)
Creatinine, Ser: 0.63 mg/dL (ref 0.44–1.00)
GFR, Estimated: >60 mL/min (ref >60)
Glucose, Bld: 145 mg/dL — ABNORMAL HIGH (ref 70–99)
Potassium: 3.3 mmol/L — ABNORMAL LOW (ref 3.5–5.1)
Sodium: 142 mmol/L (ref 135–145)
Total Bilirubin: 0.5 mg/dL (ref 0.3–1.2)
Total Protein: 5.7 g/dL — ABNORMAL LOW (ref 6.5–8.1)

## 2021-05-19 LAB — C DIFFICILE QUICK SCREEN W PCR REFLEX
C Diff antigen: NEGATIVE
C Diff interpretation: NOT DETECTED
C Diff toxin: NEGATIVE

## 2021-05-19 LAB — CBC
HCT: 35.5 % — ABNORMAL LOW (ref 36.0–46.0)
Hemoglobin: 12.1 g/dL (ref 12.0–15.0)
MCH: 31.6 pg (ref 26.0–34.0)
MCHC: 34.1 g/dL (ref 30.0–36.0)
MCV: 92.7 fL (ref 80.0–100.0)
Platelets: 229 10*3/uL (ref 150–400)
RBC: 3.83 MIL/uL — ABNORMAL LOW (ref 3.87–5.11)
RDW: 11.9 % (ref 11.5–15.5)
WBC: 5.9 10*3/uL (ref 4.0–10.5)
nRBC: 0 % (ref 0.0–0.2)

## 2021-05-19 LAB — LIPASE, BLOOD: Lipase: 91 U/L — ABNORMAL HIGH (ref 11–51)

## 2021-05-19 MED ORDER — ENOXAPARIN SODIUM 40 MG/0.4ML IJ SOSY
40.0000 mg | PREFILLED_SYRINGE | INTRAMUSCULAR | Status: DC
  Administered 2021-05-19 – 2021-05-21 (×3): 40 mg via SUBCUTANEOUS
  Filled 2021-05-19 (×3): qty 0.4

## 2021-05-19 MED ORDER — ATORVASTATIN CALCIUM 40 MG PO TABS
80.0000 mg | ORAL_TABLET | Freq: Every day | ORAL | Status: DC
  Administered 2021-05-19 – 2021-05-22 (×4): 80 mg via ORAL
  Filled 2021-05-19 (×5): qty 2

## 2021-05-19 MED ORDER — ALPRAZOLAM 0.5 MG PO TABS
0.5000 mg | ORAL_TABLET | Freq: Once | ORAL | Status: AC
  Administered 2021-05-19: 0.5 mg via ORAL
  Filled 2021-05-19: qty 1

## 2021-05-19 MED ORDER — IRBESARTAN 75 MG PO TABS
75.0000 mg | ORAL_TABLET | Freq: Every day | ORAL | Status: DC
  Administered 2021-05-19: 75 mg via ORAL
  Filled 2021-05-19: qty 1

## 2021-05-19 MED ORDER — ALPRAZOLAM 0.5 MG PO TABS
0.5000 mg | ORAL_TABLET | Freq: Every evening | ORAL | Status: DC | PRN
  Administered 2021-05-22: 0.5 mg via ORAL
  Filled 2021-05-19: qty 1

## 2021-05-19 MED ORDER — METOPROLOL TARTRATE 25 MG PO TABS
25.0000 mg | ORAL_TABLET | Freq: Two times a day (BID) | ORAL | Status: DC
  Administered 2021-05-19 – 2021-05-22 (×8): 25 mg via ORAL
  Filled 2021-05-19 (×8): qty 1

## 2021-05-19 MED ORDER — BOOST / RESOURCE BREEZE PO LIQD CUSTOM
1.0000 | Freq: Three times a day (TID) | ORAL | Status: DC
  Administered 2021-05-19 – 2021-05-22 (×3): 1 via ORAL

## 2021-05-19 MED ORDER — DEXTROSE IN LACTATED RINGERS 5 % IV SOLN: INTRAVENOUS | Status: DC

## 2021-05-19 MED ORDER — AMLODIPINE BESYLATE 5 MG PO TABS
5.0000 mg | ORAL_TABLET | Freq: Every day | ORAL | Status: DC
  Administered 2021-05-19 – 2021-06-01 (×13): 5 mg via ORAL
  Filled 2021-05-19 (×15): qty 1

## 2021-05-19 MED ORDER — CITALOPRAM HYDROBROMIDE 20 MG PO TABS
10.0000 mg | ORAL_TABLET | Freq: Every day | ORAL | Status: DC
  Administered 2021-05-19 – 2021-06-01 (×13): 10 mg via ORAL
  Filled 2021-05-19 (×15): qty 1

## 2021-05-19 MED ORDER — ONDANSETRON HCL 4 MG PO TABS
4.0000 mg | ORAL_TABLET | Freq: Four times a day (QID) | ORAL | Status: DC | PRN
  Administered 2021-05-20 – 2021-05-22 (×2): 4 mg via ORAL
  Filled 2021-05-19 (×2): qty 1

## 2021-05-19 MED ORDER — ACETAMINOPHEN 325 MG PO TABS
650.0000 mg | ORAL_TABLET | Freq: Four times a day (QID) | ORAL | Status: DC | PRN
  Administered 2021-05-27: 650 mg via ORAL
  Filled 2021-05-19 (×2): qty 2

## 2021-05-19 MED ORDER — MORPHINE SULFATE (PF) 2 MG/ML IV SOLN: 2.0000 mg | INTRAVENOUS | Status: DC | PRN

## 2021-05-19 MED ORDER — ACETAMINOPHEN 650 MG RE SUPP: 650.0000 mg | Freq: Four times a day (QID) | RECTAL | Status: DC | PRN

## 2021-05-19 MED ORDER — POTASSIUM CHLORIDE CRYS ER 20 MEQ PO TBCR
40.0000 meq | EXTENDED_RELEASE_TABLET | ORAL | Status: AC
  Administered 2021-05-19: 40 meq via ORAL
  Filled 2021-05-19: qty 2

## 2021-05-19 MED ORDER — PANTOPRAZOLE SODIUM 40 MG PO TBEC
40.0000 mg | DELAYED_RELEASE_TABLET | Freq: Two times a day (BID) | ORAL | Status: DC
  Administered 2021-05-19 – 2021-06-01 (×26): 40 mg via ORAL
  Filled 2021-05-19 (×27): qty 1

## 2021-05-19 MED ORDER — ONDANSETRON HCL 4 MG/2ML IJ SOLN
4.0000 mg | Freq: Four times a day (QID) | INTRAMUSCULAR | Status: DC | PRN
  Administered 2021-05-31 – 2021-06-01 (×2): 4 mg via INTRAVENOUS
  Filled 2021-05-19 (×2): qty 2

## 2021-05-19 NOTE — Progress Notes (Signed)
PROGRESS NOTE  WILSON SAMPLE EGB:[id number] DOB: 04-Dec-1934   PCP: Aletha Halim., PA-C  Patient is from: Home.  DOA: 05/18/2021 LOS: 1  Chief complaints:  Chief Complaint  Patient presents with   Abdominal Pain   Diarrhea   Failure To Thrive     Brief Narrative / Interim history: 86 year old F with PMH of HTN, HLD, vertigo, LLL nodule, osteoarthritis, GERD, appendectomy and aortic aneurysm presenting with intermittent diarrhea, abdominal pain, decreased appetite and about 15 to 20 pound unintentional weight loss in the last 2 months.  She had CT abdomen and pelvis on 7/12 that showed multiple liver lesions, wall thickening in transverse colon and large complex left renal cyst. She had GI appointment for 7/18 but pain acutely gotten worse and prompted her to come to ED.    In ED, slightly tachycardic.  Lipase 93.  CT chest/abdomen/pelvis showed LLL density extending to the pleural surface with interval change in morphology of some GG density in the left lower lobe solid and spiculated mass about 2.7 cm concerning for lung cancer, as well as multiple ill-defined hypodense liver lesions concerning for metastasis and 5.6 cm complex lobulated cyst in the midpole left kidney.   Subjective: Seen and examined earlier this morning.  Feels "better".  Abdominal pain resolved but tender.  She denies nausea or vomiting.  Still with diarrhea but denies melena or hematochezia.  Also some shortness of breath but denies chest pain, cough, fever or chills.  Objective: Vitals:   05/19/21 0018 05/19/21 0042 05/19/21 0533 05/19/21 0933  BP: (!) 154/78  124/62 (!) 141/68  Pulse: 91  86 (!) 50  Resp: 20  18 16   Temp: 98 F (36.7 C)  98 F (36.7 C)   TempSrc: Oral  Oral   SpO2: 95%  98% 95%  Weight:  62.6 kg    Height:  5\' 7"  (1.702 m)      Intake/Output Summary (Last 24 hours) at 05/19/2021 1119 Last data filed at 05/19/2021 0600 Gross per 24 hour  Intake 1095.87 ml  Output --  Net  1095.87 ml   Filed Weights   05/19/21 0042  Weight: 62.6 kg    Examination:  GENERAL: No apparent distress.  Nontoxic. HEENT: MMM.  Vision and hearing grossly intact.  NECK: Supple.  No apparent JVD.  RESP: On RA.  No IWOB.  Fair aeration bilaterally. CVS:  RRR. Heart sounds normal.  ABD/GI/GU: BS+. Abd soft.  Some tenderness with palpation across upper abdomen. MSK/EXT:  Moves extremities. No apparent deformity. No edema.  SKIN: no apparent skin lesion or wound NEURO: Awake, alert and oriented appropriately.  No apparent focal neuro deficit. PSYCH: Calm. Normal affect.   Procedures:  None  Microbiology summarized: [id number] and influenza PCR nonreactive.  Assessment & Plan: LLL nodule/mass/multiple ill-defined hypodense liver lesions-CT shows interval change in morphology of previously noted 2.7 cm GG density in LLL that appears more solid and spiculated concerning for lung cancer, and multiple liver lesions concerning for metastatic disease. -IR and GI consulted consulted -Ambulatory referral to oncology ordered  Abdominal pain/diarrhea/decreased appetite/unintentional weight loss-likely due to the above.  However, she has tenderness across upper abdomen.  Lipase slightly elevated but no radiologic evidence of pancreatitis.  She reports history of heartburn and GERD.  CT on 7/12 concerning for transverse colon colitis concerning for infectious process as well. -GI consulted -Follow GIP and C. Difficile -P.o. Protonix 40 mg twice daily -Continue clear liquid diet for now -We will start Imodium  or Metamucil once infection ruled out.  Large complex complex lobulated left renal cyst: Noted on CT. measures 5.6 x 4 x 4 cm.  Or 3.1 cm on ultrasound in 2007 and 2011. -Need further evaluation with abdominal MRI once clinically stable unable to hold breath  Essential hypertension: BP slightly elevated. -Resume home amlodipine, metoprolol -Start low-dose Avapro instead of Benicar    Hyperlipidemia:  -Continue home statin.   Anxiety and depression: Stable. -Resume home medications  Shortness of breath: Normal saturation on room air.  Lung exam reassuring.  Could be due to #1. -Encourage incentive spirometry  Ascending aortic aneurysm: 3.8 cm  on CT angio chest in 2017. -Needs on annual imaging on this  Hypokalemia: K3.3. -K-Dur 40 mill equivalent x2   Unintentional weight loss Body mass index is 21.62 kg/m.         DVT prophylaxis:  enoxaparin (LOVENOX) injection 40 mg Start: 05/19/21 0600  Code Status: Full code Family Communication: Patient and/or RN. Available if any question.  Level of care: Med-Surg Status is: Inpatient  Remains inpatient appropriate because:Ongoing diagnostic testing needed not appropriate for outpatient work up, IV treatments appropriate due to intensity of illness or inability to take PO, and Inpatient level of care appropriate due to severity of illness  Dispo: The patient is from: Home              Anticipated d/c is to: Home              Patient currently is not medically stable to d/c.   Difficult to place patient No       Consultants:  Interventional radiology Gastroenterology   Sch Meds:  Scheduled Meds:  enoxaparin (LOVENOX) injection  40 mg Subcutaneous Q24H   feeding supplement  1 Container Oral TID BM   potassium chloride  40 mEq Oral Q4H   Continuous Infusions:  dextrose 5% lactated ringers 125 mL/hr at 05/19/21 0113   PRN Meds:.acetaminophen **OR** acetaminophen, morphine injection, ondansetron **OR** ondansetron (ZOFRAN) IV  Antimicrobials: Anti-infectives (From admission, onward)    None        I have personally reviewed the following labs and images: CBC: Recent Labs  Lab 05/18/21 2021 05/19/21 0351  WBC 8.6 5.9  HGB 13.2 12.1  HCT 39.1 35.5*  MCV 91.6 92.7  PLT 295 229   BMP &GFR Recent Labs  Lab 05/18/21 2021 05/19/21 0351  NA 139 142  K 3.5 3.3*  CL 107 108  CO2 21*  24  GLUCOSE 121* 145*  BUN 20 15  CREATININE 0.74 0.63  CALCIUM 9.4 8.8*   Estimated Creatinine Clearance: 50.9 mL/min (by C-G formula based on SCr of 0.63 mg/dL). Liver & Pancreas: Recent Labs  Lab 05/18/21 2021 05/19/21 0351  AST 45* 40  ALT 23 21  ALKPHOS 134* 123  BILITOT 0.7 0.5  PROT 6.3* 5.7*  ALBUMIN 3.3* 3.1*   Recent Labs  Lab 05/18/21 2021 05/19/21 0706  LIPASE 93* 91*   No results for input(s): AMMONIA in the last 168 hours. Diabetic: No results for input(s): HGBA1C in the last 72 hours. No results for input(s): GLUCAP in the last 168 hours. Cardiac Enzymes: No results for input(s): CKTOTAL, CKMB, CKMBINDEX, TROPONINI in the last 168 hours. No results for input(s): PROBNP in the last 8760 hours. Coagulation Profile: No results for input(s): INR, PROTIME in the last 168 hours. Thyroid Function Tests: No results for input(s): TSH, T4TOTAL, FREET4, T3FREE, THYROIDAB in the last 72 hours. Lipid Profile:  No results for input(s): CHOL, HDL, LDLCALC, TRIG, CHOLHDL, LDLDIRECT in the last 72 hours. Anemia Panel: No results for input(s): VITAMINB12, FOLATE, FERRITIN, TIBC, IRON, RETICCTPCT in the last 72 hours. Urine analysis:    Component Value Date/Time   COLORURINE YELLOW 05/18/2021 2000   APPEARANCEUR CLEAR 05/18/2021 2000   LABSPEC 1.020 05/18/2021 2000   PHURINE 6.0 05/18/2021 2000   GLUCOSEU NEGATIVE 05/18/2021 2000   HGBUR MODERATE (A) 05/18/2021 2000   BILIRUBINUR NEGATIVE 05/18/2021 2000   KETONESUR NEGATIVE 05/18/2021 2000   PROTEINUR NEGATIVE 05/18/2021 2000   UROBILINOGEN 0.2 06/27/2009 0855   NITRITE NEGATIVE 05/18/2021 2000   LEUKOCYTESUR NEGATIVE 05/18/2021 2000   Sepsis Labs: Invalid input(s): PROCALCITONIN, [place]  Microbiology: Recent Results (from the past 240 hour(s))  Resp Panel by RT-PCR (Flu A&B, Covid) Nasopharyngeal Swab     Status: None   Collection Time: 05/18/21  8:21 PM   Specimen: Nasopharyngeal Swab;  Nasopharyngeal(NP) swabs in vial transport medium  Result Value Ref Range Status   SARS Coronavirus 2 by RT PCR NEGATIVE NEGATIVE Final    Comment: (NOTE) SARS-CoV-2 target nucleic acids are NOT DETECTED.  The SARS-CoV-2 RNA is generally detectable in upper respiratory specimens during the acute phase of infection. The lowest concentration of SARS-CoV-2 viral copies this assay can detect is 138 copies/mL. A negative result does not preclude SARS-Cov-2 infection and should not be used as the sole basis for treatment or other patient management decisions. A negative result may occur with  improper specimen collection/handling, submission of specimen other than nasopharyngeal swab, presence of viral mutation(s) within the areas targeted by this assay, and inadequate number of viral copies(<138 copies/mL). A negative result must be combined with clinical observations, patient history, and epidemiological information. The expected result is Negative.  Fact Sheet for Patients:  EntrepreneurPulse.com.au  Fact Sheet for Healthcare Providers:  IncredibleEmployment.be  This test is no t yet approved or cleared by the [place] FDA and  has been authorized for detection and/or diagnosis of SARS-CoV-2 by FDA under an Emergency Use Authorization (EUA). This EUA will remain  in effect (meaning this test can be used) for the duration of the COVID-19 declaration under Section 564(b)(1) of the Act, 21 U.S.C.section 360bbb-3(b)(1), unless the authorization is terminated  or revoked sooner.       Influenza A by PCR NEGATIVE NEGATIVE Final   Influenza B by PCR NEGATIVE NEGATIVE Final    Comment: (NOTE) The Xpert Xpress SARS-CoV-2/FLU/RSV plus assay is intended as an aid in the diagnosis of influenza from Nasopharyngeal swab specimens and should not be used as a sole basis for treatment. Nasal washings and aspirates are unacceptable for Xpert Xpress  SARS-CoV-2/FLU/RSV testing.  Fact Sheet for Patients: EntrepreneurPulse.com.au  Fact Sheet for Healthcare Providers: IncredibleEmployment.be  This test is not yet approved or cleared by the [place] FDA and has been authorized for detection and/or diagnosis of SARS-CoV-2 by FDA under an Emergency Use Authorization (EUA). This EUA will remain in effect (meaning this test can be used) for the duration of the COVID-19 declaration under Section 564(b)(1) of the Act, 21 U.S.C. section 360bbb-3(b)(1), unless the authorization is terminated or revoked.  Performed at [facility], [place] [address], [place], [place] [address]     Radiology Studies: CT Head Wo Contrast  Result Date: 05/18/2021 CLINICAL DATA:  Abdomen pain diarrhea EXAM: CT HEAD WITHOUT CONTRAST TECHNIQUE: Contiguous axial images were obtained from the base of the skull through the vertex without intravenous contrast. COMPARISON:  MRI brain 01/02/2014 FINDINGS: Brain: No acute territorial infarction, hemorrhage or intracranial mass. Moderate atrophy. Cerebellar atrophy. Nonenlarged ventricles. Patchy white matter hypodensity consistent with chronic small vessel ischemic change Vascular: No hyperdense vessels.  Carotid vascular calcification Skull: Normal. Negative for fracture or focal lesion. Sinuses/Orbits: No acute finding. Other: None IMPRESSION: 1. No CT evidence for acute intracranial abnormality. 2. Cortical and cerebellar atrophy. Chronic small vessel ischemic changes of the white matter Electronically Signed   By: Donavan Foil M.D.   On: 05/18/2021 22:15   CT Chest W Contrast  Result Date: 05/18/2021 CLINICAL DATA:  GI issues poor p.o. intake abdomen pain EXAM: CT CHEST, ABDOMEN, AND PELVIS WITH CONTRAST TECHNIQUE: Multidetector CT imaging of the chest, abdomen and pelvis was performed following the standard protocol during bolus administration of intravenous  contrast. CONTRAST:  59mL OMNIPAQUE IOHEXOL 350 MG/ML SOLN COMPARISON:  MRI 01/08/2020, CT 12/02/2015, ultrasound 12/22/2005 FINDINGS: CT CHEST FINDINGS Cardiovascular: Mild aortic atherosclerosis. No aneurysm. Coronary vascular calcification. Normal cardiac size. No pericardial effusion Mediastinum/Nodes: Midline trachea. Subcentimeter left thyroid nodule, no further workup recommended based on age of patient and size of lesion. No suspicious adenopathy. Esophagus within normal limits Lungs/Pleura: Biapical pleural and parenchymal scarring. Bandlike density in the left lower lobe extending to the pleural surface as noted on previous and suggestive of scarring. Interval change in morphology of sub solid area in the superior left lower lobe, now appears more solid and spiculated, this measures 2.7 cm AP x 1.5 cm transverse by 2.4 cm craniocaudad. Given interval change in morphology, findings are concerning for lung carcinoma. Musculoskeletal: Sternum is intact.  No suspicious bone lesion. CT ABDOMEN PELVIS FINDINGS Hepatobiliary: Ill-defined multiple hypodense liver nodules. No calcified gallstone or biliary dilatation Pancreas: Unremarkable. No pancreatic ductal dilatation or surrounding inflammatory changes. Spleen: Normal in size without focal abnormality. Adrenals/Urinary Tract: Adrenal glands are normal. Kidneys show no hydronephrosis. Subcentimeter cortical hypodense renal lesions, too small to further characterize. 5.6 by 4 by 4 cm complex lobulated cyst in the left kidney containing septations and calcification. Urinary bladder is unremarkable. Stomach/Bowel: The stomach is nonenlarged. No dilated small bowel. No acute bowel wall thickening. The appendix is not well visualized. Scattered diverticular disease. Vascular/Lymphatic: Moderate aortic atherosclerosis. No aneurysm. No suspicious nodes Reproductive: Uterus and bilateral adnexa are unremarkable. Other: Negative for free air or free fluid.  Musculoskeletal: Degenerative changes of the spine. Small focal sclerosis at T12, possibly a small bone island. IMPRESSION: 1. Bandlike density in the left lower lobe that extends to the pleural surface, suggestive of scarring. Interval change in morphology of previously noted ground-glass density in the left lower lobe associated with the linear density, now appears more solid and spiculated, measuring up to 2.7 cm and concerning for lung carcinoma. Pulmonary consultation is recommended. 2. Multiple ill-defined hypodense liver lesions concerning for metastatic disease 3. 5.6 cm complex lobulated cyst mid pole left kidney. When the patient is clinically stable and able to follow directions and hold their breath (preferably as an outpatient) further evaluation with dedicated abdominal MRI should be considered. Electronically Signed   By: Donavan Foil M.D.   On: 05/18/2021 22:36   CT ABDOMEN PELVIS W CONTRAST  Result Date: 05/18/2021 CLINICAL DATA:  GI issues poor p.o. intake abdomen pain EXAM: CT CHEST, ABDOMEN, AND PELVIS WITH CONTRAST TECHNIQUE: Multidetector CT imaging of the chest, abdomen and pelvis was performed following the standard protocol during bolus administration of intravenous contrast. CONTRAST:  92mL OMNIPAQUE IOHEXOL 350 MG/ML SOLN COMPARISON:  MRI  01/08/2020, CT 12/02/2015, ultrasound 12/22/2005 FINDINGS: CT CHEST FINDINGS Cardiovascular: Mild aortic atherosclerosis. No aneurysm. Coronary vascular calcification. Normal cardiac size. No pericardial effusion Mediastinum/Nodes: Midline trachea. Subcentimeter left thyroid nodule, no further workup recommended based on age of patient and size of lesion. No suspicious adenopathy. Esophagus within normal limits Lungs/Pleura: Biapical pleural and parenchymal scarring. Bandlike density in the left lower lobe extending to the pleural surface as noted on previous and suggestive of scarring. Interval change in morphology of sub solid area in the superior  left lower lobe, now appears more solid and spiculated, this measures 2.7 cm AP x 1.5 cm transverse by 2.4 cm craniocaudad. Given interval change in morphology, findings are concerning for lung carcinoma. Musculoskeletal: Sternum is intact.  No suspicious bone lesion. CT ABDOMEN PELVIS FINDINGS Hepatobiliary: Ill-defined multiple hypodense liver nodules. No calcified gallstone or biliary dilatation Pancreas: Unremarkable. No pancreatic ductal dilatation or surrounding inflammatory changes. Spleen: Normal in size without focal abnormality. Adrenals/Urinary Tract: Adrenal glands are normal. Kidneys show no hydronephrosis. Subcentimeter cortical hypodense renal lesions, too small to further characterize. 5.6 by 4 by 4 cm complex lobulated cyst in the left kidney containing septations and calcification. Urinary bladder is unremarkable. Stomach/Bowel: The stomach is nonenlarged. No dilated small bowel. No acute bowel wall thickening. The appendix is not well visualized. Scattered diverticular disease. Vascular/Lymphatic: Moderate aortic atherosclerosis. No aneurysm. No suspicious nodes Reproductive: Uterus and bilateral adnexa are unremarkable. Other: Negative for free air or free fluid. Musculoskeletal: Degenerative changes of the spine. Small focal sclerosis at T12, possibly a small bone island. IMPRESSION: 1. Bandlike density in the left lower lobe that extends to the pleural surface, suggestive of scarring. Interval change in morphology of previously noted ground-glass density in the left lower lobe associated with the linear density, now appears more solid and spiculated, measuring up to 2.7 cm and concerning for lung carcinoma. Pulmonary consultation is recommended. 2. Multiple ill-defined hypodense liver lesions concerning for metastatic disease 3. 5.6 cm complex lobulated cyst mid pole left kidney. When the patient is clinically stable and able to follow directions and hold their breath (preferably as an  outpatient) further evaluation with dedicated abdominal MRI should be considered. Electronically Signed   By: Donavan Foil M.D.   On: 05/18/2021 22:36       Caly[address] Mcqu[address]. [place]  If 7PM-7AM, please contact night-coverage [email or web address] 05/19/2021, 11:19 AM

## 2021-05-19 NOTE — Consult Note (Signed)
Consultation  Referring Provider:   Dr. Cyndia Skeeters Primary Care Physician:  Aletha Halim., PA-C Primary Gastroenterologist:   Dr. Fuller Plan      Reason for Consultation:   Diarrhea, abdominal pain and weight loss         HPI:   Karla Chavez is a 86 y.o. female with a past medical history significant for GERD, hypertension, hyperlipidemia, vertigo, aortic aneurysm, osteoarthritis, status post appendectomy, who presented to the ER on 7/17 with persistent diarrhea on and off for the past 2 months loss in appetite and weight loss with persistent abdominal pain.    Today, the patient is found with her daughter by her bedside who does assist with history.  They describe that over the past 2 months the patient has just deteriorated.  Noting that at first she was having a lot of watery diarrhea, she tells me anytime she eats or drinks anything she has watery loose stools, in fact has not seen any form in the past month for sure.  Also developed an abdominal pain across her epigastric region noting this is sometimes worse with food and had a large decrease in appetite telling me that she really was not eating or drinking anything but some clear liquid.   Pain is described as sharp rated as a 8/10, worse when someone pushes on the area.  Associated symptoms include weakness and a 20 pound weight loss over the past 2 months.    Daughter also tells me she was treated for a "in kidney infection", for which she was on antibiotics which she finished yesterday.  Patient also uses Plavix and last took this the morning of 7/17.    Denies fever, chills, heartburn, reflux, nausea, vomiting or blood in her stool.  ER course: Alk phos 134, lipase 93, CBC normal, COVID-negative; CT chest, abdomen pelvis with left lower lobe solid and spiculated mass about 2.7 cm concerning for lung carcinoma, multiple ill-defined hypodense liver lesions concerning for metastatic disease, 5.6 cm complex lobulated cyst in the midpole of  left kidney  GI history: 10/28/2016 colonoscopy Dr. Fuller Plan: - Two 6 to 8 mm polyps in the ascending colon, removed with a cold snare. Resected and retrieved. - Internal hemorrhoids. - Mild diverticulosis in the sigmoid colon. There was no evidence of diverticular bleeding. - The examination was otherwise normal on direct and retroflexion views. Plan: Repeat in 5 years  02/27/2016 office visit Dr. Fuller Plan: Discussed reflux symptoms, continued on Protonix 40 mg daily, also set her up for a surveillance colonoscopy given history of adenomatous polyps  Past Medical History:  Diagnosis Date  . Adenomatous colon polyp 03/2010  . Allergy   . Aortic aneurysm ([place])   . Arthritis    "joints" (03/01/2014)  . Family history of anesthesia complication    pts sister has severe nausea and vomiting  . GERD (gastroesophageal reflux disease)   . History of blood transfusion 1958   "related to childbirth"  . Hyperlipidemia   . Hypertension   . Near syncope   . Palpitations   . Pneumonia 06/2009  . PONV (postoperative nausea and vomiting)    "this OR" (03/01/2014)  . Vertigo    hx of occured on 01/01/14    Past Surgical History:  Procedure Laterality Date  . APPENDECTOMY  1958  . CARDIOVASCULAR STRESS TEST  01/09/2008   EF 82%  . CERVICAL CONE BIOPSY  1970's  . COLONOSCOPY    . PANENDOSCOPY    . PAROTIDECTOMY  Left 03/01/2104   superficial with facial nerve preservation   . PAROTIDECTOMY Left 03/01/2014   Procedure: LEFT SUPERFICIAL  VS TOTAL PAROTIDECTOMY;  Surgeon: Jodi Marble, MD;  Location: [place];  Service: ENT;  Laterality: Left;  . TUBAL LIGATION  1972  . tumor in neck  2015    Family History  Problem Relation Age of Onset  . Hypertension Mother   . Stroke Mother 83  . Heart disease Father 90       massive MI  . Heart disease Sister 25       CABG  . Heart disease Brother 56       CABG    Social History   Tobacco Use  . Smoking status: Never  . Smokeless tobacco: Never   Substance Use Topics  . Alcohol use: No  . Drug use: No    Prior to Admission medications   Medication Sig Start Date End Date Taking? Authorizing Provider  ALPRAZolam Duanne Moron) 0.5 MG tablet Take 0.5 mg by mouth at bedtime as needed for anxiety.   Yes [provider]  amLODipine (NORVASC) 5 MG tablet Take 1 tablet (5 mg total) by mouth daily. 10/04/17  Yes Martinique, Peter M, MD  atorvastatin (LIPITOR) 80 MG tablet Take 1 tablet (80 mg total) by mouth daily. 03/12/20  Yes [place], Peter M, MD  citalopram (CELEXA) 10 MG tablet Take 10 mg by mouth daily.   Yes [provider]  clopidogrel (PLAVIX) 75 MG tablet Take by mouth. 09/12/20  Yes [provider]  metoprolol tartrate (LOPRESSOR) 25 MG tablet Take 1 tablet (25 mg total) by mouth 2 (two) times daily. NEED OV. Patient taking differently: Take 25 mg by mouth 2 (two) times daily. 01/03/18  Yes [place], Peter M, MD  olmesartan (BENICAR) 20 MG tablet TAKE 1 TABLET BY MOUTH EVERY DAY 01/20/21  Yes [place], Peter M, MD    Current Facility-Administered Medications  Medication Dose Route Frequency Provider Last Rate Last Admin  . acetaminophen (TYLENOL) tablet 650 mg  650 mg Oral Q6H PRN Elwyn Reach, MD       Or  . acetaminophen (TYLENOL) suppository 650 mg  650 mg Rectal Q6H PRN Garba, Mohammad L, MD      . dextrose 5 % in lactated ringers infusion   Intravenous Continuous Elwyn Reach, MD 125 mL/hr at 05/19/21 0113 New Bag at 05/19/21 0113  . enoxaparin (LOVENOX) injection 40 mg  40 mg Subcutaneous Q24H Gala Romney L, MD   40 mg at 05/19/21 0526  . feeding supplement (BOOST / RESOURCE BREEZE) liquid 1 Container  1 Container Oral TID BM Garba, Mohammad L, MD      . morphine 2 MG/ML injection 2 mg  2 mg Intravenous Q2H PRN Jonelle Sidle, Mohammad L, MD      . ondansetron (ZOFRAN) tablet 4 mg  4 mg Oral Q6H PRN Elwyn Reach, MD       Or  . ondansetron (ZOFRAN) injection 4 mg  4 mg Intravenous Q6H PRN Jonelle Sidle, Mohammad  L, MD      . potassium chloride SA (KLOR-CON) CR tablet 40 mEq  40 mEq Oral Q4H Wendee Beavers T, MD   40 mEq at 05/19/21 0848    Allergies as of 05/18/2021 - Review Complete 05/18/2021  Allergen Reaction Noted  . Amoxicillin  05/26/2011  . Benazepril hcl Hives 01/16/2019     Review of Systems:    Constitutional: No fever or chills Skin: No rash  Cardiovascular: No chest pain, chest pressure or palpitations   Respiratory: No SOB or cough Gastrointestinal: See HPI and otherwise negative Genitourinary: No dysuria  Neurological: No headache Musculoskeletal: No new muscle or joint pain Hematologic: No bleeding Psychiatric: No history of depression or anxiety    Physical Exam:  Vital signs in last 24 hours: Temp:  [98 F (36.7 C)-98.2 F (36.8 C)] 98 F (36.7 C) (07/18 0533) Pulse Rate:  [50-101] 50 (07/18 0933) Resp:  [16-25] 16 (07/18 0933) BP: (124-154)/(62-82) 141/68 (07/18 0933) SpO2:  [93 %-98 %] 95 % (07/18 0933) Weight:  [62.6 kg] 62.6 kg (07/18 0042) Last BM Date: 05/18/21 General:   Pleasant elderly Caucasian female appears to be in NAD, Well developed, Well nourished, alert and cooperative Head:  Normocephalic and atraumatic. Eyes:   PEERL, EOMI. No icterus. Conjunctiva pink. Ears:  Normal auditory acuity. Neck:  Supple Throat: Oral cavity and pharynx without inflammation, swelling or lesion.  Lungs: Respirations even and unlabored. Lungs clear to auscultation bilaterally.   No wheezes, crackles, or rhonchi.  Heart: Normal S1, S2. No MRG. Regular rate and rhythm. No peripheral edema, cyanosis or pallor.  Abdomen:  Soft, nondistended, marked right upper quadrant/epigastric TTP with involuntary guarding, normal bowel sounds. No appreciable masses or hepatomegaly. Rectal:  Not performed.  Msk:  Symmetrical without gross deformities. Peripheral pulses intact.  Extremities:  Without edema, no deformity or joint abnormality.  Neurologic:  Alert and  oriented x4;  grossly  normal neurologically.  Skin:   Dry and intact without significant lesions or rashes. Psychiatric: Demonstrates good judgement and reason without abnormal affect or behaviors.   LAB RESULTS: Recent Labs    05/18/21 2021 05/19/21 0351  WBC 8.6 5.9  HGB 13.2 12.1  HCT 39.1 35.5*  PLT 295 229   BMET Recent Labs    05/18/21 2021 05/19/21 0351  NA 139 142  K 3.5 3.3*  CL 107 108  CO2 21* 24  GLUCOSE 121* 145*  BUN 20 15  CREATININE 0.74 0.63  CALCIUM 9.4 8.8*   LFT Recent Labs    05/19/21 0351  PROT 5.7*  ALBUMIN 3.1*  AST 40  ALT 21  ALKPHOS 123  BILITOT 0.5    STUDIES: CT Head Wo Contrast  Result Date: 05/18/2021 CLINICAL DATA:  Abdomen pain diarrhea EXAM: CT HEAD WITHOUT CONTRAST TECHNIQUE: Contiguous axial images were obtained from the base of the skull through the vertex without intravenous contrast. COMPARISON:  MRI brain 01/02/2014 FINDINGS: Brain: No acute territorial infarction, hemorrhage or intracranial mass. Moderate atrophy. Cerebellar atrophy. Nonenlarged ventricles. Patchy white matter hypodensity consistent with chronic small vessel ischemic change Vascular: No hyperdense vessels.  Carotid vascular calcification Skull: Normal. Negative for fracture or focal lesion. Sinuses/Orbits: No acute finding. Other: None IMPRESSION: 1. No CT evidence for acute intracranial abnormality. 2. Cortical and cerebellar atrophy. Chronic small vessel ischemic changes of the white matter Electronically Signed   By: Donavan Foil M.D.   On: 05/18/2021 22:15   CT Chest W Contrast  Result Date: 05/18/2021 CLINICAL DATA:  GI issues poor p.o. intake abdomen pain EXAM: CT CHEST, ABDOMEN, AND PELVIS WITH CONTRAST TECHNIQUE: Multidetector CT imaging of the chest, abdomen and pelvis was performed following the standard protocol during bolus administration of intravenous contrast. CONTRAST:  84m OMNIPAQUE IOHEXOL 350 MG/ML SOLN COMPARISON:  MRI 01/08/2020, CT 12/02/2015, ultrasound  12/22/2005 FINDINGS: CT CHEST FINDINGS Cardiovascular: Mild aortic atherosclerosis. No aneurysm. Coronary vascular calcification. Normal cardiac size. No pericardial effusion Mediastinum/Nodes: Midline trachea.  Subcentimeter left thyroid nodule, no further workup recommended based on age of patient and size of lesion. No suspicious adenopathy. Esophagus within normal limits Lungs/Pleura: Biapical pleural and parenchymal scarring. Bandlike density in the left lower lobe extending to the pleural surface as noted on previous and suggestive of scarring. Interval change in morphology of sub solid area in the superior left lower lobe, now appears more solid and spiculated, this measures 2.7 cm AP x 1.5 cm transverse by 2.4 cm craniocaudad. Given interval change in morphology, findings are concerning for lung carcinoma. Musculoskeletal: Sternum is intact.  No suspicious bone lesion. CT ABDOMEN PELVIS FINDINGS Hepatobiliary: Ill-defined multiple hypodense liver nodules. No calcified gallstone or biliary dilatation Pancreas: Unremarkable. No pancreatic ductal dilatation or surrounding inflammatory changes. Spleen: Normal in size without focal abnormality. Adrenals/Urinary Tract: Adrenal glands are normal. Kidneys show no hydronephrosis. Subcentimeter cortical hypodense renal lesions, too small to further characterize. 5.6 by 4 by 4 cm complex lobulated cyst in the left kidney containing septations and calcification. Urinary bladder is unremarkable. Stomach/Bowel: The stomach is nonenlarged. No dilated small bowel. No acute bowel wall thickening. The appendix is not well visualized. Scattered diverticular disease. Vascular/Lymphatic: Moderate aortic atherosclerosis. No aneurysm. No suspicious nodes Reproductive: Uterus and bilateral adnexa are unremarkable. Other: Negative for free air or free fluid. Musculoskeletal: Degenerative changes of the spine. Small focal sclerosis at T12, possibly a small bone island. IMPRESSION: 1.  Bandlike density in the left lower lobe that extends to the pleural surface, suggestive of scarring. Interval change in morphology of previously noted ground-glass density in the left lower lobe associated with the linear density, now appears more solid and spiculated, measuring up to 2.7 cm and concerning for lung carcinoma. Pulmonary consultation is recommended. 2. Multiple ill-defined hypodense liver lesions concerning for metastatic disease 3. 5.6 cm complex lobulated cyst mid pole left kidney. When the patient is clinically stable and able to follow directions and hold their breath (preferably as an outpatient) further evaluation with dedicated abdominal MRI should be considered. Electronically Signed   By: Donavan Foil M.D.   On: 05/18/2021 22:36   CT ABDOMEN PELVIS W CONTRAST  Result Date: 05/18/2021 CLINICAL DATA:  GI issues poor p.o. intake abdomen pain EXAM: CT CHEST, ABDOMEN, AND PELVIS WITH CONTRAST TECHNIQUE: Multidetector CT imaging of the chest, abdomen and pelvis was performed following the standard protocol during bolus administration of intravenous contrast. CONTRAST:  109m OMNIPAQUE IOHEXOL 350 MG/ML SOLN COMPARISON:  MRI 01/08/2020, CT 12/02/2015, ultrasound 12/22/2005 FINDINGS: CT CHEST FINDINGS Cardiovascular: Mild aortic atherosclerosis. No aneurysm. Coronary vascular calcification. Normal cardiac size. No pericardial effusion Mediastinum/Nodes: Midline trachea. Subcentimeter left thyroid nodule, no further workup recommended based on age of patient and size of lesion. No suspicious adenopathy. Esophagus within normal limits Lungs/Pleura: Biapical pleural and parenchymal scarring. Bandlike density in the left lower lobe extending to the pleural surface as noted on previous and suggestive of scarring. Interval change in morphology of sub solid area in the superior left lower lobe, now appears more solid and spiculated, this measures 2.7 cm AP x 1.5 cm transverse by 2.4 cm craniocaudad.  Given interval change in morphology, findings are concerning for lung carcinoma. Musculoskeletal: Sternum is intact.  No suspicious bone lesion. CT ABDOMEN PELVIS FINDINGS Hepatobiliary: Ill-defined multiple hypodense liver nodules. No calcified gallstone or biliary dilatation Pancreas: Unremarkable. No pancreatic ductal dilatation or surrounding inflammatory changes. Spleen: Normal in size without focal abnormality. Adrenals/Urinary Tract: Adrenal glands are normal. Kidneys show no hydronephrosis. Subcentimeter cortical hypodense renal  lesions, too small to further characterize. 5.6 by 4 by 4 cm complex lobulated cyst in the left kidney containing septations and calcification. Urinary bladder is unremarkable. Stomach/Bowel: The stomach is nonenlarged. No dilated small bowel. No acute bowel wall thickening. The appendix is not well visualized. Scattered diverticular disease. Vascular/Lymphatic: Moderate aortic atherosclerosis. No aneurysm. No suspicious nodes Reproductive: Uterus and bilateral adnexa are unremarkable. Other: Negative for free air or free fluid. Musculoskeletal: Degenerative changes of the spine. Small focal sclerosis at T12, possibly a small bone island. IMPRESSION: 1. Bandlike density in the left lower lobe that extends to the pleural surface, suggestive of scarring. Interval change in morphology of previously noted ground-glass density in the left lower lobe associated with the linear density, now appears more solid and spiculated, measuring up to 2.7 cm and concerning for lung carcinoma. Pulmonary consultation is recommended. 2. Multiple ill-defined hypodense liver lesions concerning for metastatic disease 3. 5.6 cm complex lobulated cyst mid pole left kidney. When the patient is clinically stable and able to follow directions and hold their breath (preferably as an outpatient) further evaluation with dedicated abdominal MRI should be considered. Electronically Signed   By: Donavan Foil M.D.    On: 05/18/2021 22:36      Impression / Plan:   Impression: 1.  Multiple masses of the abdomen and lung: Suspected primary lung cancer with mets to the liver 2.  Abdominal pain: Likely with above as pain is mostly over the liver on palpation 3.  Weight loss: 20 pounds, likely due to decrease in appetite from suspected cancer as above 4.  Diarrhea: Watery stools at least 5-6 times a day for the past 2 months, consider from cancer as this can sometimes occur with increased production of peptides in relation to this, but will also order stool studies to rule out infectious cause  Plan: 1.  Order GI pathogen panel and C. difficile for further evaluation of diarrhea, though this could just be coming from suspected cancer. 2.  Discussed with the patient and her daughter that our highest suspicion at this moment is of cancer, possibly lung cancer with metastasis to the liver.  Patient is scheduled for an ultrasound-guided biopsy of the liver later today. 3.  Continue supportive measures and continue clear liquid diet as tolerated 4.  Pending stool studies, would recommend Imodium for now for diarrhea 5.  Discussed with patient and her daughter that there are no immediate plans for colonoscopy, especially given that she needs to be worked up for this new possibility of cancer.  Answered all their questions.  Thank you for your kind consultation, we will continue to follow.  Karla Chavez  05/19/2021, 10:40 AM

## 2021-05-19 NOTE — Progress Notes (Unsigned)
       Patient Demographics  Patient Name  Karla Chavez, Karla Chavez Legal Sex  Female DOB  19-Nov-1934 SSN  [id number] Address  [place] [address] Phone  [phone] [facility])  [phone] (Mobile)     RE: Biopsy Received: 3 days ago Arne Cleveland, MD  [facility] Please have recent OSH liver imaging loaded into PACS  Then resubmit for review and approval   Thx  DDH         Previous Messages    ----- Message -----  From: Karla Chavez  Sent: 05/16/2021   3:58 PM EDT  To: [facility] Procedure Requests  Subject: Biopsy                                         Procedure Requested:  CT Guided image Drainage    Reason for Procedure:liver lesions -suspect metastic    Provider Requesting: Dr Deatra Ina  Provider [phone]    Other Info:

## 2021-05-19 NOTE — Progress Notes (Signed)
Mobility Specialist - Progress Note     05/19/21 1200  Mobility  Activity Ambulated in hall  Level of Assistance Minimal assist, patient does 75% or more  Assistive Device None  Distance Ambulated (ft) 50 ft  Mobility Ambulated with assistance in hallway  Mobility Response Tolerated well  Mobility performed by Mobility specialist  $Mobility charge 1 Mobility   Pt required Min A when attempting log roll and standing up. Pt stated she did not use a RW and she would be fine to ambulated without one. Pt ambulated ~50 ft in hallway with Min A. Gait belt was used to keep pt stable. Pt experienced LOB toward end of session due to shoes, but recovered quick. Pt returned to bed after session and was left with call bell at side and family in room. RN informed of session.   [place] Specialist Acute [facility] Services Office: [phone] 05/19/21, 12:30 PM

## 2021-05-20 DIAGNOSIS — K769 Liver disease, unspecified: Secondary | ICD-10-CM | POA: Diagnosis not present

## 2021-05-20 DIAGNOSIS — K529 Noninfective gastroenteritis and colitis, unspecified: Secondary | ICD-10-CM | POA: Diagnosis not present

## 2021-05-20 DIAGNOSIS — R1084 Generalized abdominal pain: Secondary | ICD-10-CM | POA: Diagnosis not present

## 2021-05-20 DIAGNOSIS — I10 Essential (primary) hypertension: Secondary | ICD-10-CM | POA: Diagnosis not present

## 2021-05-20 DIAGNOSIS — R1013 Epigastric pain: Secondary | ICD-10-CM | POA: Diagnosis not present

## 2021-05-20 DIAGNOSIS — R918 Other nonspecific abnormal finding of lung field: Secondary | ICD-10-CM | POA: Diagnosis not present

## 2021-05-20 DIAGNOSIS — R627 Adult failure to thrive: Secondary | ICD-10-CM

## 2021-05-20 LAB — GASTROINTESTINAL PANEL BY PCR, STOOL (REPLACES STOOL CULTURE)

## 2021-05-20 LAB — CBC
HCT: 36.7 % (ref 36.0–46.0)
Hemoglobin: 12.5 g/dL (ref 12.0–15.0)
MCH: 31.6 pg (ref 26.0–34.0)
MCHC: 34.1 g/dL (ref 30.0–36.0)
MCV: 92.7 fL (ref 80.0–100.0)
Platelets: 238 10*3/uL (ref 150–400)
RBC: 3.96 MIL/uL (ref 3.87–5.11)
RDW: 11.9 % (ref 11.5–15.5)
WBC: 6.9 10*3/uL (ref 4.0–10.5)
nRBC: 0 % (ref 0.0–0.2)

## 2021-05-20 LAB — COMPREHENSIVE METABOLIC PANEL
ALT: 25 U/L (ref 0–44)
AST: 40 U/L (ref 15–41)
Albumin: 3 g/dL — ABNORMAL LOW (ref 3.5–5.0)
Alkaline Phosphatase: 129 U/L — ABNORMAL HIGH (ref 38–126)
Anion gap: 8 (ref 5–15)
BUN: 14 mg/dL (ref 8–23)
CO2: 22 mmol/L (ref 22–32)
Calcium: 9.2 mg/dL (ref 8.9–10.3)
Chloride: 113 mmol/L — ABNORMAL HIGH (ref 98–111)
Creatinine, Ser: 0.6 mg/dL (ref 0.44–1.00)
GFR, Estimated: >60 mL/min (ref >60)
Glucose, Bld: 102 mg/dL — ABNORMAL HIGH (ref 70–99)
Potassium: 3.4 mmol/L — ABNORMAL LOW (ref 3.5–5.1)
Sodium: 143 mmol/L (ref 135–145)
Total Bilirubin: 0.8 mg/dL (ref 0.3–1.2)
Total Protein: 5.9 g/dL — ABNORMAL LOW (ref 6.5–8.1)

## 2021-05-20 LAB — LIPASE, BLOOD: Lipase: 66 U/L — ABNORMAL HIGH (ref 11–51)

## 2021-05-20 LAB — MAGNESIUM: Magnesium: 1.5 mg/dL — ABNORMAL LOW (ref 1.7–2.4)

## 2021-05-20 LAB — PHOSPHORUS: Phosphorus: 3.6 mg/dL (ref 2.5–4.6)

## 2021-05-20 MED ORDER — POTASSIUM CHLORIDE CRYS ER 20 MEQ PO TBCR
40.0000 meq | EXTENDED_RELEASE_TABLET | ORAL | Status: AC
  Administered 2021-05-20 (×2): 40 meq via ORAL
  Filled 2021-05-20 (×2): qty 2

## 2021-05-20 MED ORDER — MIRTAZAPINE 15 MG PO TABS
7.5000 mg | ORAL_TABLET | Freq: Every day | ORAL | Status: DC
  Administered 2021-05-20: 7.5 mg via ORAL
  Filled 2021-05-20: qty 1

## 2021-05-20 MED ORDER — MAGNESIUM SULFATE 2 GM/50ML IV SOLN
2.0000 g | Freq: Once | INTRAVENOUS | Status: AC
  Administered 2021-05-20: 2 g via INTRAVENOUS
  Filled 2021-05-20: qty 50

## 2021-05-20 MED ORDER — PROSOURCE PLUS PO LIQD
30.0000 mL | Freq: Two times a day (BID) | ORAL | Status: DC
  Administered 2021-05-20 – 2021-05-22 (×4): 30 mL via ORAL
  Filled 2021-05-20 (×3): qty 30

## 2021-05-20 MED ORDER — RESOURCE INSTANT PROTEIN PO PWD PACKET
1.0000 | Freq: Three times a day (TID) | ORAL | Status: DC
  Administered 2021-05-20 – 2021-05-27 (×6): 6 g via ORAL
  Filled 2021-05-20 (×22): qty 6

## 2021-05-20 MED ORDER — SODIUM CHLORIDE 0.9 % IV SOLN
500.0000 mg | Freq: Every day | INTRAVENOUS | Status: AC
  Administered 2021-05-20 – 2021-05-22 (×3): 500 mg via INTRAVENOUS
  Filled 2021-05-20 (×3): qty 500

## 2021-05-20 MED ORDER — ALUM & MAG HYDROXIDE-SIMETH 200-200-20 MG/5ML PO SUSP: 30.0000 mL | Freq: Three times a day (TID) | ORAL | Status: DC | PRN

## 2021-05-20 MED ORDER — POTASSIUM CHLORIDE IN NACL 20-0.45 MEQ/L-% IV SOLN
INTRAVENOUS | Status: DC
Start: 2021-05-20 — End: 2021-05-23
  Filled 2021-05-20 (×9): qty 1000

## 2021-05-20 MED ORDER — IRBESARTAN 150 MG PO TABS
150.0000 mg | ORAL_TABLET | Freq: Every day | ORAL | Status: DC
  Administered 2021-05-20 – 2021-06-01 (×12): 150 mg via ORAL
  Filled 2021-05-20 (×14): qty 1

## 2021-05-20 NOTE — Progress Notes (Signed)
PROGRESS NOTE  Karla Chavez NTZ:[id number] DOB: [birth year]   PCP: Aletha Halim., PA-C  Patient is from: Home.  DOA: 05/18/2021 LOS: 2  Chief complaints:  Chief Complaint  Patient presents with   Abdominal Pain   Diarrhea   Failure To Thrive     Brief Narrative / Interim history: 86 year old F with PMH of HTN, HLD, vertigo, LLL nodule, osteoarthritis, GERD, appendectomy and aortic aneurysm presenting with intermittent diarrhea, abdominal pain, decreased appetite and about 15 to 20 pound unintentional weight loss in the last 2 months.  She had CT abdomen and pelvis on 7/12 that showed multiple liver lesions, wall thickening in transverse colon and large complex left renal cyst. She had GI appointment for 7/18 but pain acutely gotten worse and prompted her to come to ED.    In ED, slightly tachycardic.  Lipase 93.  CT chest/abdomen/pelvis showed LLL density extending to the pleural surface with interval change in morphology of some GG density in the left lower lobe solid and spiculated mass about 2.7 cm concerning for lung cancer, as well as multiple ill-defined hypodense liver lesions concerning for metastasis and 5.6 cm complex lobulated cyst in the midpole left kidney.   Subjective: Seen and examined earlier this morning.  No major events overnight of this morning.  About 3 bowel movements charted from overnight.  She has another 2 loose bowel movement this morning.  Abdominal pain improved.  Denies nausea or vomiting.  Denies melena or hematochezia.  Patient's son at bedside.  He is very concerned about her ongoing diarrhea, poor p.o. intake  Objective: Vitals:   05/19/21 2216 05/20/21 0602 05/20/21 1006 05/20/21 1424  BP: (!) 158/86 (!) 147/69 130/87 131/69  Pulse: 92 73 86 68  Resp: 16 18 16 16   Temp: 98.1 F (36.7 C) 97.9 F (36.6 C) 97.7 F (36.5 C) 97.7 F (36.5 C)  TempSrc: Oral Oral Oral Oral  SpO2: 95% 93% 97% 95%  Weight:      Height:        Intake/Output  Summary (Last 24 hours) at 05/20/2021 1549 Last data filed at 05/20/2021 1504 Gross per 24 hour  Intake 690 ml  Output 1 ml  Net 689 ml   Filed Weights   05/19/21 0042  Weight: 62.6 kg    Examination:  GENERAL: No apparent distress.  Nontoxic. HEENT: MMM.  Vision and hearing grossly intact.  NECK: Supple.  No apparent JVD.  RESP: On RA.  No IWOB.  Fair aeration bilaterally. CVS:  RRR. Heart sounds normal.  ABD/GI/GU: BS+. Abd soft.  Tenderness over epigastric area. MSK/EXT:  Moves extremities. No apparent deformity. No edema.  SKIN: no apparent skin lesion or wound NEURO: Awake and alert. Oriented appropriately.  No apparent focal neuro deficit. PSYCH: Calm. Normal affect.   Procedures:  None  Microbiology summarized: [id number] and influenza PCR nonreactive.  Assessment & Plan: LLL nodule/mass/multiple ill-defined hypodense liver lesions-CT shows interval change in morphology of previously noted 2.7 cm GG density in LLL that appears more solid and spiculated concerning for lung cancer, and multiple liver lesions concerning for metastatic disease. -IR consulted recommended Plavix washout until Friday before needle biopsy of liver lesion.   -Patient  has outpatient follow-up for liver biopsy -Ambulatory referral to oncology ordered  Abdominal pain/diarrhea/decreased appetite/unintentional weight loss-likely due to the above.  However, she has tenderness over epigastric area which could raise concern for gastritis or gastric ulcer.  She has no melena or hematochezia. She reports history of  heartburn and GERD.  Lipase slightly elevated but no radiologic evidence of pancreatitis.  CT on 7/12 concerning for transverse colon colitis concerning for colitis.  This was not noted on repeat CT this admission.  C. difficile negative.   -GI following -Follow GI panel -P.o. Protonix 40 mg twice daily -Continue regular diet -Start Imodium and low-dose Remeron at night. -Continue  multivitamin -Consult to dietitian  Large complex complex lobulated left renal cyst: Noted on CT. measures 5.6 x 4 x 4 cm.  Or 3.1 cm on ultrasound in 2007 and 2011. -Need further evaluation with abdominal MRI once clinically stable unable to hold breath -Discussed this with patient and patient's son at bedside.  Essential hypertension: BP slightly elevated. -Continue home amlodipine, metoprolol -Increase Avapro to 150 mg daily   Hyperlipidemia:  -Continue home statin.   Anxiety and depression: Stable. -Continue home medications  Shortness of breath: Normal saturation on room air.  Lung exam reassuring.  Could be due to #1. -Encourage incentive spirometry  Ascending aortic aneurysm: 3.8 cm  on CT angio chest in 2017. -Needs on annual imaging on this  Hypokalemia/hypomagnesemia: K 3.4.  Mg 1.5. -P.o. K-Dur 40 mill equivalent x2 -IV magnesium sulfate 2 g x 1   Unintentional weight loss Body mass index is 21.62 kg/m. Nutrition Problem: Inadequate oral intake Etiology: poor appetite, diarrhea Signs/Symptoms: per patient/family report Interventions: MVI, Prostat, Snacks, Magic cup   DVT prophylaxis:  enoxaparin (LOVENOX) injection 40 mg Start: 05/19/21 0600  Code Status: Full code Family Communication: Patient and/or RN.  Updated patient's son at bedside. Level of care: Med-Surg Status is: Inpatient  Remains inpatient appropriate because:IV treatments appropriate due to intensity of illness or inability to take PO and Inpatient level of care appropriate due to severity of illness  Dispo: The patient is from: Home              Anticipated d/c is to: Home              Patient currently is not medically stable to d/c.   Difficult to place patient No       Consultants:  Interventional radiology-signed off Gastroenterology   Sch Meds:  Scheduled Meds:  (feeding supplement) PROSource Plus  30 mL Oral BID BM   amLODipine  5 mg Oral Daily   atorvastatin  80 mg Oral  Daily   citalopram  10 mg Oral Daily   enoxaparin (LOVENOX) injection  40 mg Subcutaneous Q24H   feeding supplement  1 Container Oral TID BM   irbesartan  150 mg Oral Daily   metoprolol tartrate  25 mg Oral BID   mirtazapine  7.5 mg Oral QHS   pantoprazole  40 mg Oral BID   protein supplement  1 Scoop Oral TID WC   Continuous Infusions:   PRN Meds:.acetaminophen **OR** acetaminophen, ALPRAZolam, alum & mag hydroxide-simeth, morphine injection, ondansetron **OR** ondansetron (ZOFRAN) IV  Antimicrobials: Anti-infectives (From admission, onward)    None        I have personally reviewed the following labs and images: CBC: Recent Labs  Lab 05/18/21 2021 05/19/21 0351 05/20/21 0735  WBC 8.6 5.9 6.9  HGB 13.2 12.1 12.5  HCT 39.1 35.5* 36.7  MCV 91.6 92.7 92.7  PLT 295 229 238   BMP &GFR Recent Labs  Lab 05/18/21 2021 05/19/21 0351 05/20/21 0735  NA 139 142 143  K 3.5 3.3* 3.4*  CL 107 108 113*  CO2 21* 24 22  GLUCOSE 121* 145* 102*  BUN 20 15 14   CREATININE 0.74 0.63 0.60  CALCIUM 9.4 8.8* 9.2  MG  --   --  1.5*  PHOS  --   --  3.6   Estimated Creatinine Clearance: 50.9 mL/min (by C-G formula based on SCr of 0.6 mg/dL). Liver & Pancreas: Recent Labs  Lab 05/18/21 2021 05/19/21 0351 05/20/21 0735  AST 45* 40 40  ALT 23 21 25   ALKPHOS 134* 123 129*  BILITOT 0.7 0.5 0.8  PROT 6.3* 5.7* 5.9*  ALBUMIN 3.3* 3.1* 3.0*   Recent Labs  Lab 05/18/21 2021 05/19/21 0706 05/20/21 0735  LIPASE 93* 91* 66*   No results for input(s): AMMONIA in the last 168 hours. Diabetic: No results for input(s): HGBA1C in the last 72 hours. No results for input(s): GLUCAP in the last 168 hours. Cardiac Enzymes: No results for input(s): CKTOTAL, CKMB, CKMBINDEX, TROPONINI in the last 168 hours. No results for input(s): PROBNP in the last 8760 hours. Coagulation Profile: No results for input(s): INR, PROTIME in the last 168 hours. Thyroid Function Tests: No results for  input(s): TSH, T4TOTAL, FREET4, T3FREE, THYROIDAB in the last 72 hours. Lipid Profile: No results for input(s): CHOL, HDL, LDLCALC, TRIG, CHOLHDL, LDLDIRECT in the last 72 hours. Anemia Panel: No results for input(s): VITAMINB12, FOLATE, FERRITIN, TIBC, IRON, RETICCTPCT in the last 72 hours. Urine analysis:    Component Value Date/Time   COLORURINE YELLOW 05/18/2021 2000   APPEARANCEUR CLEAR 05/18/2021 2000   LABSPEC 1.020 05/18/2021 2000   PHURINE 6.0 05/18/2021 2000   GLUCOSEU NEGATIVE 05/18/2021 2000   HGBUR MODERATE (A) 05/18/2021 2000   BILIRUBINUR NEGATIVE 05/18/2021 2000   KETONESUR NEGATIVE 05/18/2021 2000   PROTEINUR NEGATIVE 05/18/2021 2000   UROBILINOGEN 0.2 06/27/2009 0855   NITRITE NEGATIVE 05/18/2021 2000   LEUKOCYTESUR NEGATIVE 05/18/2021 2000   Sepsis Labs: Invalid input(s): PROCALCITONIN, [place]  Microbiology: Recent Results (from the past 240 hour(s))  Resp Panel by RT-PCR (Flu A&B, Covid) Nasopharyngeal Swab     Status: None   Collection Time: 05/18/21  8:21 PM   Specimen: Nasopharyngeal Swab; Nasopharyngeal(NP) swabs in vial transport medium  Result Value Ref Range Status   SARS Coronavirus 2 by RT PCR NEGATIVE NEGATIVE Final    Comment: (NOTE) SARS-CoV-2 target nucleic acids are NOT DETECTED.  The SARS-CoV-2 RNA is generally detectable in upper respiratory specimens during the acute phase of infection. The lowest concentration of SARS-CoV-2 viral copies this assay can detect is 138 copies/mL. A negative result does not preclude SARS-Cov-2 infection and should not be used as the sole basis for treatment or other patient management decisions. A negative result may occur with  improper specimen collection/handling, submission of specimen other than nasopharyngeal swab, presence of viral mutation(s) within the areas targeted by this assay, and inadequate number of viral copies(<138 copies/mL). A negative result must be combined with clinical  observations, patient history, and epidemiological information. The expected result is Negative.  Fact Sheet for Patients:  EntrepreneurPulse.com.au  Fact Sheet for Healthcare Providers:  IncredibleEmployment.be  This test is no t yet approved or cleared by the [place] FDA and  has been authorized for detection and/or diagnosis of SARS-CoV-2 by FDA under an Emergency Use Authorization (EUA). This EUA will remain  in effect (meaning this test can be used) for the duration of the COVID-19 declaration under Section 564(b)(1) of the Act, 21 U.S.C.section 360bbb-3(b)(1), unless the authorization is terminated  or revoked sooner.       Influenza A by PCR  NEGATIVE NEGATIVE Final   Influenza B by PCR NEGATIVE NEGATIVE Final    Comment: (NOTE) The Xpert Xpress SARS-CoV-2/FLU/RSV plus assay is intended as an aid in the diagnosis of influenza from Nasopharyngeal swab specimens and should not be used as a sole basis for treatment. Nasal washings and aspirates are unacceptable for Xpert Xpress SARS-CoV-2/FLU/RSV testing.  Fact Sheet for Patients: EntrepreneurPulse.com.au  Fact Sheet for Healthcare Providers: IncredibleEmployment.be  This test is not yet approved or cleared by the [place] FDA and has been authorized for detection and/or diagnosis of SARS-CoV-2 by FDA under an Emergency Use Authorization (EUA). This EUA will remain in effect (meaning this test can be used) for the duration of the COVID-19 declaration under Section 564(b)(1) of the Act, 21 U.S.C. section 360bbb-3(b)(1), unless the authorization is terminated or revoked.  Performed at [facility], [place] [address], [place], [place] [address]   C Difficile Quick Screen w PCR reflex     Status: None   Collection Time: 05/19/21 11:31 AM   Specimen: Stool  Result Value Ref Range Status   C Diff antigen NEGATIVE NEGATIVE  Final   C Diff toxin NEGATIVE NEGATIVE Final   C Diff interpretation No C. difficile detected.  Final    Comment: Performed at [facility], [place] [address] [place], [place] [address]    Radiology Studies: No results found.     Fahed Morten T. Hull  If 7PM-7AM, please contact night-coverage [email or web address] 05/20/2021, 3:49 PM

## 2021-05-20 NOTE — Progress Notes (Signed)
Progress Note   Subjective  Chief Complaint: Diarrhea, abdominal pain and weight loss  This morning, the patient is found with her son by her bedside.  She tells me that the diarrhea is "maybe a little better".  Per nurse in the room she had 2 liquid stools yesterday but "quite a lot throughout the night", and she had had to that morning per her son.  Her son is concerned because they are discussing sending her home but he tells me that she is unable to eat really anything, she had 2 bites off of her regular tray this morning.  They have tried boost at home which she does not enjoy and will not drink.  He is worried she will have continued failure to thrive if she goes back home.  They want to have a solid plan before she is discharged.    Objective   Vital signs in last 24 hours: Temp:  [97.9 F (36.6 C)-98.1 F (36.7 C)] 97.9 F (36.6 C) (07/19 0602) Pulse Rate:  [73-92] 86 (07/19 1006) Resp:  [16-18] 16 (07/19 1006) BP: (130-158)/(59-87) 130/87 (07/19 1006) SpO2:  [93 %-97 %] 97 % (07/19 1006) Last BM Date: 05/19/21 General:    Elderly white female in NAD Heart:  Regular rate and rhythm; no murmurs Lungs: Respirations even and unlabored, lungs CTA bilaterally Abdomen:  Soft, moderate right upper quadrant TTP and nondistended. Normal bowel sounds. Psych:  Cooperative. Normal mood and affect.  Intake/Output from previous day: 07/18 0701 - 07/19 0700 In: 1669.8 [P.O.:1100; I.V.:569.8] Out: 1 [Urine:1]   Lab Results: Recent Labs    05/18/21 2021 05/19/21 0351 05/20/21 0735  WBC 8.6 5.9 6.9  HGB 13.2 12.1 12.5  HCT 39.1 35.5* 36.7  PLT 295 229 238   BMET Recent Labs    05/18/21 2021 05/19/21 0351 05/20/21 0735  NA 139 142 143  K 3.5 3.3* 3.4*  CL 107 108 113*  CO2 21* 24 22  GLUCOSE 121* 145* 102*  BUN 20 15 14   CREATININE 0.74 0.63 0.60  CALCIUM 9.4 8.8* 9.2   Hepatic Function Latest Ref Rng & Units 05/20/2021 05/19/2021 05/18/2021  Total Protein 6.5 - 8.1  g/dL 5.9(L) 5.7(L) 6.3(L)  Albumin 3.5 - 5.0 g/dL 3.0(L) 3.1(L) 3.3(L)  AST 15 - 41 U/L 40 40 45(H)  ALT 0 - 44 U/L 25 21 23   Alk Phosphatase 38 - 126 U/L 129(H) 123 134(H)  Total Bilirubin 0.3 - 1.2 mg/dL 0.8 0.5 0.7  Bilirubin, Direct 0.00 - 0.40 mg/dL - - -     Studies/Results: CT Head Wo Contrast  Result Date: 05/18/2021 CLINICAL DATA:  Abdomen pain diarrhea EXAM: CT HEAD WITHOUT CONTRAST TECHNIQUE: Contiguous axial images were obtained from the base of the skull through the vertex without intravenous contrast. COMPARISON:  MRI brain 01/02/2014 FINDINGS: Brain: No acute territorial infarction, hemorrhage or intracranial mass. Moderate atrophy. Cerebellar atrophy. Nonenlarged ventricles. Patchy white matter hypodensity consistent with chronic small vessel ischemic change Vascular: No hyperdense vessels.  Carotid vascular calcification Skull: Normal. Negative for fracture or focal lesion. Sinuses/Orbits: No acute finding. Other: None IMPRESSION: 1. No CT evidence for acute intracranial abnormality. 2. Cortical and cerebellar atrophy. Chronic small vessel ischemic changes of the white matter Electronically Signed   By: Donavan Foil M.D.   On: 05/18/2021 22:15   CT Chest W Contrast  Result Date: 05/18/2021 CLINICAL DATA:  GI issues poor p.o. intake abdomen pain EXAM: CT CHEST, ABDOMEN, AND PELVIS WITH CONTRAST TECHNIQUE:  Multidetector CT imaging of the chest, abdomen and pelvis was performed following the standard protocol during bolus administration of intravenous contrast. CONTRAST:  42m OMNIPAQUE IOHEXOL 350 MG/ML SOLN COMPARISON:  MRI 01/08/2020, CT 12/02/2015, ultrasound 12/22/2005 FINDINGS: CT CHEST FINDINGS Cardiovascular: Mild aortic atherosclerosis. No aneurysm. Coronary vascular calcification. Normal cardiac size. No pericardial effusion Mediastinum/Nodes: Midline trachea. Subcentimeter left thyroid nodule, no further workup recommended based on age of patient and size of lesion. No  suspicious adenopathy. Esophagus within normal limits Lungs/Pleura: Biapical pleural and parenchymal scarring. Bandlike density in the left lower lobe extending to the pleural surface as noted on previous and suggestive of scarring. Interval change in morphology of sub solid area in the superior left lower lobe, now appears more solid and spiculated, this measures 2.7 cm AP x 1.5 cm transverse by 2.4 cm craniocaudad. Given interval change in morphology, findings are concerning for lung carcinoma. Musculoskeletal: Sternum is intact.  No suspicious bone lesion. CT ABDOMEN PELVIS FINDINGS Hepatobiliary: Ill-defined multiple hypodense liver nodules. No calcified gallstone or biliary dilatation Pancreas: Unremarkable. No pancreatic ductal dilatation or surrounding inflammatory changes. Spleen: Normal in size without focal abnormality. Adrenals/Urinary Tract: Adrenal glands are normal. Kidneys show no hydronephrosis. Subcentimeter cortical hypodense renal lesions, too small to further characterize. 5.6 by 4 by 4 cm complex lobulated cyst in the left kidney containing septations and calcification. Urinary bladder is unremarkable. Stomach/Bowel: The stomach is nonenlarged. No dilated small bowel. No acute bowel wall thickening. The appendix is not well visualized. Scattered diverticular disease. Vascular/Lymphatic: Moderate aortic atherosclerosis. No aneurysm. No suspicious nodes Reproductive: Uterus and bilateral adnexa are unremarkable. Other: Negative for free air or free fluid. Musculoskeletal: Degenerative changes of the spine. Small focal sclerosis at T12, possibly a small bone island. IMPRESSION: 1. Bandlike density in the left lower lobe that extends to the pleural surface, suggestive of scarring. Interval change in morphology of previously noted ground-glass density in the left lower lobe associated with the linear density, now appears more solid and spiculated, measuring up to 2.7 cm and concerning for lung  carcinoma. Pulmonary consultation is recommended. 2. Multiple ill-defined hypodense liver lesions concerning for metastatic disease 3. 5.6 cm complex lobulated cyst mid pole left kidney. When the patient is clinically stable and able to follow directions and hold their breath (preferably as an outpatient) further evaluation with dedicated abdominal MRI should be considered. Electronically Signed   By: KDonavan FoilM.D.   On: 05/18/2021 22:36   CT ABDOMEN PELVIS W CONTRAST  Result Date: 05/18/2021 CLINICAL DATA:  GI issues poor p.o. intake abdomen pain EXAM: CT CHEST, ABDOMEN, AND PELVIS WITH CONTRAST TECHNIQUE: Multidetector CT imaging of the chest, abdomen and pelvis was performed following the standard protocol during bolus administration of intravenous contrast. CONTRAST:  813mOMNIPAQUE IOHEXOL 350 MG/ML SOLN COMPARISON:  MRI 01/08/2020, CT 12/02/2015, ultrasound 12/22/2005 FINDINGS: CT CHEST FINDINGS Cardiovascular: Mild aortic atherosclerosis. No aneurysm. Coronary vascular calcification. Normal cardiac size. No pericardial effusion Mediastinum/Nodes: Midline trachea. Subcentimeter left thyroid nodule, no further workup recommended based on age of patient and size of lesion. No suspicious adenopathy. Esophagus within normal limits Lungs/Pleura: Biapical pleural and parenchymal scarring. Bandlike density in the left lower lobe extending to the pleural surface as noted on previous and suggestive of scarring. Interval change in morphology of sub solid area in the superior left lower lobe, now appears more solid and spiculated, this measures 2.7 cm AP x 1.5 cm transverse by 2.4 cm craniocaudad. Given interval change in morphology, findings are concerning  for lung carcinoma. Musculoskeletal: Sternum is intact.  No suspicious bone lesion. CT ABDOMEN PELVIS FINDINGS Hepatobiliary: Ill-defined multiple hypodense liver nodules. No calcified gallstone or biliary dilatation Pancreas: Unremarkable. No pancreatic  ductal dilatation or surrounding inflammatory changes. Spleen: Normal in size without focal abnormality. Adrenals/Urinary Tract: Adrenal glands are normal. Kidneys show no hydronephrosis. Subcentimeter cortical hypodense renal lesions, too small to further characterize. 5.6 by 4 by 4 cm complex lobulated cyst in the left kidney containing septations and calcification. Urinary bladder is unremarkable. Stomach/Bowel: The stomach is nonenlarged. No dilated small bowel. No acute bowel wall thickening. The appendix is not well visualized. Scattered diverticular disease. Vascular/Lymphatic: Moderate aortic atherosclerosis. No aneurysm. No suspicious nodes Reproductive: Uterus and bilateral adnexa are unremarkable. Other: Negative for free air or free fluid. Musculoskeletal: Degenerative changes of the spine. Small focal sclerosis at T12, possibly a small bone island. IMPRESSION: 1. Bandlike density in the left lower lobe that extends to the pleural surface, suggestive of scarring. Interval change in morphology of previously noted ground-glass density in the left lower lobe associated with the linear density, now appears more solid and spiculated, measuring up to 2.7 cm and concerning for lung carcinoma. Pulmonary consultation is recommended. 2. Multiple ill-defined hypodense liver lesions concerning for metastatic disease 3. 5.6 cm complex lobulated cyst mid pole left kidney. When the patient is clinically stable and able to follow directions and hold their breath (preferably as an outpatient) further evaluation with dedicated abdominal MRI should be considered. Electronically Signed   By: Donavan Foil M.D.   On: 05/18/2021 22:36      Assessment / Plan:   Assessment: 1.  Multiple masses of the abdomen and lung: Suspicion of primary lung cancer, biopsy planned for Friday 7/22 after Plavix washout 2.  Abdominal pain: Likely from liver mass 3.  Weight loss: With a decrease in appetite, about 20 pounds over the past 2  months; likely from cancer 4.  Diarrhea: Continues with watery stools, apparently worse last night than during the day; again could be related to possibility of cancer versus infectious cause, C. difficile negative  Plan: 1.  Patient is unable to have her biopsy until Friday given that she has been on Plavix.  There is some discussion about whether or not to send her home and have this done outpatient. 2.  There is concern from patient's son and family that she will have failure to thrive at home as she is unable to eat anything.  May need to further discuss alternate forms of nutrition, possible consult from dietitian versus appetite stimulant like mirtazapine 3.  C. difficile results are negative, we are awaiting GI pathogen panel.  Again diarrhea could be from cancer and after work-up of this if patient continues with symptoms she may need a colonoscopy but there are no emergent plans for this. 4.  Agree with the addition of Protonix to see if this helps some.  Thank you for your kind consultation.  We will continue to follow along.   LOS: 2 days   Levin Erp  05/20/2021, 10:33 AM

## 2021-05-20 NOTE — Progress Notes (Signed)
Initial Nutrition Assessment  INTERVENTION:   -D/c Boost Breeze  -Will order 30 ml Prosource Plus BID, each supplement provides 100 kcal and 15 grams protein.    -Beneprotein TID each provides 25 kcals and 6g protein  -Daily Snacks  NUTRITION DIAGNOSIS:   Inadequate oral intake related to poor appetite, diarrhea as evidenced by per patient/family report.  GOAL:   Patient will meet greater than or equal to 90% of their needs  MONITOR:   PO intake, Supplement acceptance, Labs, Weight trends, I & O's  REASON FOR ASSESSMENT:   Malnutrition Screening Tool, Consult Assessment of nutrition requirement/status  ASSESSMENT:   86 year old F with PMH of HTN, HLD, vertigo, LLL nodule, osteoarthritis, GERD, appendectomy and aortic aneurysm presenting with intermittent diarrhea, abdominal pain, decreased appetite and about 15 to 20 pound unintentional weight loss in the last 2 months.  She had CT abdomen and pelvis on 7/12 that showed multiple liver lesions, wall thickening in transverse colon and large complex left renal cyst. She had GI appointment for 7/18 but pain acutely gotten worse and prompted her to come to ED.  Unable to see patient x2. Pt in room with other providers/staff. Will attempt to see at a later time. Will place high calorie, high protein handout in discharge instructions.  Per chart review, pt has not been eating well per family. Pt does not like Boost Breeze that was ordered. Pt has been having diarrhea for 2-3 months PTA.  Per GI note, pt to have a liver biopsy. Possible lung cancer with liver mets.  Per weight records, pt has lost 16 lbs since 09/18/20 (10% wt loss x 8 months, insignificant for time frame).  Medications: Remeron, KLOR-CON, IV Mg sulfate  Labs reviewed:  Low K, Mg  NUTRITION - FOCUSED PHYSICAL EXAM:  Unable to complete  Diet Order:   Diet Order             Diet regular Room service appropriate? Yes; Fluid consistency: Thin  Diet effective  now                   EDUCATION NEEDS:   No education needs have been identified at this time  Skin:  Skin Assessment: Reviewed RN Assessment  Last BM:  7/19  Height:   Ht Readings from Last 1 Encounters:  05/19/21 5\' 7"  (1.702 m)    Weight:   Wt Readings from Last 1 Encounters:  05/19/21 62.6 kg    BMI:  Body mass index is 21.62 kg/m.  Estimated Nutritional Needs:   Kcal:  1550-1750  Protein:  70-85g  Fluid:  1.7L/day   Clayton Bibles, MS, RD, LDN Inpatient Clinical Dietitian Contact information available via Amion

## 2021-05-20 NOTE — Progress Notes (Signed)
Mobility Specialist - Progress Note     05/20/21 1440  Mobility  Activity Ambulated in hall  Level of Assistance Minimal assist, patient does 75% or more  Assistive Device None  Distance Ambulated (ft) 60 ft  Mobility Ambulated with assistance in hallway  Mobility Response Tolerated well  Mobility performed by Mobility specialist  $Mobility charge 1 Mobility    Pt required Min A getting out of bed and standing up. Pt ambulated ~60 ft in hallway with contact guard. Pt experienced small LOB during ambulation, but quickly recovered. Pt c/o of pain towards end of ambulation and SOB was present. Pt returned to bed after ambulation with call bell at side and family in room.   [place] Specialist [facility] Phone: [phone] 05/20/21, 2:42 PM

## 2021-05-21 ENCOUNTER — Encounter: Payer: Self-pay | Admitting: *Deleted

## 2021-05-21 ENCOUNTER — Encounter (HOSPITAL_COMMUNITY): Payer: Self-pay | Admitting: Internal Medicine

## 2021-05-21 DIAGNOSIS — A498 Other bacterial infections of unspecified site: Secondary | ICD-10-CM

## 2021-05-21 DIAGNOSIS — K769 Liver disease, unspecified: Secondary | ICD-10-CM | POA: Diagnosis not present

## 2021-05-21 DIAGNOSIS — I10 Essential (primary) hypertension: Secondary | ICD-10-CM | POA: Diagnosis not present

## 2021-05-21 DIAGNOSIS — R918 Other nonspecific abnormal finding of lung field: Secondary | ICD-10-CM | POA: Diagnosis not present

## 2021-05-21 DIAGNOSIS — R1013 Epigastric pain: Secondary | ICD-10-CM | POA: Diagnosis not present

## 2021-05-21 DIAGNOSIS — R1084 Generalized abdominal pain: Secondary | ICD-10-CM | POA: Diagnosis not present

## 2021-05-21 DIAGNOSIS — A09 Infectious gastroenteritis and colitis, unspecified: Secondary | ICD-10-CM

## 2021-05-21 DIAGNOSIS — R627 Adult failure to thrive: Secondary | ICD-10-CM | POA: Diagnosis not present

## 2021-05-21 LAB — CBC
HCT: 35.6 % — ABNORMAL LOW (ref 36.0–46.0)
Hemoglobin: 11.9 g/dL — ABNORMAL LOW (ref 12.0–15.0)
MCH: 31.1 pg (ref 26.0–34.0)
MCHC: 33.4 g/dL (ref 30.0–36.0)
MCV: 93 fL (ref 80.0–100.0)
Platelets: 269 10*3/uL (ref 150–400)
RBC: 3.83 MIL/uL — ABNORMAL LOW (ref 3.87–5.11)
RDW: 12 % (ref 11.5–15.5)
WBC: 6.7 10*3/uL (ref 4.0–10.5)
nRBC: 0 % (ref 0.0–0.2)

## 2021-05-21 LAB — RENAL FUNCTION PANEL
Albumin: 3 g/dL — ABNORMAL LOW (ref 3.5–5.0)
Anion gap: 10 (ref 5–15)
BUN: 17 mg/dL (ref 8–23)
CO2: 19 mmol/L — ABNORMAL LOW (ref 22–32)
Calcium: 9 mg/dL (ref 8.9–10.3)
Chloride: 114 mmol/L — ABNORMAL HIGH (ref 98–111)
Creatinine, Ser: 0.54 mg/dL (ref 0.44–1.00)
GFR, Estimated: >60 mL/min (ref >60)
Glucose, Bld: 97 mg/dL (ref 70–99)
Phosphorus: 3.2 mg/dL (ref 2.5–4.6)
Potassium: 3.9 mmol/L (ref 3.5–5.1)
Sodium: 143 mmol/L (ref 135–145)

## 2021-05-21 LAB — LIPASE, BLOOD: Lipase: 70 U/L — ABNORMAL HIGH (ref 11–51)

## 2021-05-21 LAB — MAGNESIUM: Magnesium: 1.8 mg/dL (ref 1.7–2.4)

## 2021-05-21 MED ORDER — LOPERAMIDE HCL 2 MG PO CAPS
2.0000 mg | ORAL_CAPSULE | ORAL | Status: DC | PRN
  Administered 2021-05-24 – 2021-05-31 (×2): 2 mg via ORAL
  Filled 2021-05-21 (×2): qty 1

## 2021-05-21 MED ORDER — MIRTAZAPINE 15 MG PO TABS
15.0000 mg | ORAL_TABLET | Freq: Every day | ORAL | Status: DC
  Administered 2021-05-21 – 2021-05-22 (×2): 15 mg via ORAL
  Filled 2021-05-21 (×2): qty 1

## 2021-05-21 MED FILL — Protein Oral Pack: ORAL | Qty: 6 | Status: AC

## 2021-05-21 NOTE — Progress Notes (Signed)
     [facility] Progress Note  CC:  Diarrhea, abdominal pain, weight loss  Subjective:  Patient says that the diarrhea seems to be a little better this morning.  Still with severe aversion to food, can't even stand the smells of some stuff.  Not so much nausea per se.  Some abdominal pain.  Objective:  Vital signs in last 24 hours: Temp:  [97.4 F (36.3 C)-98.2 F (36.8 C)] 98.2 F (36.8 C) (07/20 0455) Pulse Rate:  [68-84] 84 (07/20 0455) Resp:  [13-18] 18 (07/20 0455) BP: (131-156)/(63-89) 141/63 (07/20 0455) SpO2:  [94 %-100 %] 100 % (07/20 0455) Last BM Date: 05/20/21 General:  Alert, Well-developed, in NAD Heart:  Regular rate and rhythm; no murmurs Pulm:  CTAB.  No W/R/R. Abdomen:  Soft, non-distended.  BS present.  Minimal TTP. Extremities:  Without edema. Neurologic:  Alert and oriented x 4;  grossly normal neurologically. Psych:  Alert and cooperative. Normal mood and affect.  Intake/Output from previous day: 07/19 0701 - 07/20 0700 In: 1795.8 [P.O.:640; I.V.:1101.7; IV Piggyback:54.2] Out: 1000 [Urine:1000] Intake/Output this shift: Total I/O In: 895.7 [P.O.:120; I.V.:279.9; IV Piggyback:495.8] Out: 0   Lab Results: Recent Labs    05/19/21 0351 05/20/21 0735 05/21/21 0403  WBC 5.9 6.9 6.7  HGB 12.1 12.5 11.9*  HCT 35.5* 36.7 35.6*  PLT 229 238 269   BMET Recent Labs    05/19/21 0351 05/20/21 0735 05/21/21 0403  NA 142 143 143  K 3.3* 3.4* 3.9  CL 108 113* 114*  CO2 24 22 19*  GLUCOSE 145* 102* 97  BUN 15 14 17   CREATININE 0.63 0.60 0.54  CALCIUM 8.8* 9.2 9.0   LFT Recent Labs    05/20/21 0735 05/21/21 0403  PROT 5.9*  --   ALBUMIN 3.0* 3.0*  AST 40  --   ALT 25  --   ALKPHOS 129*  --   BILITOT 0.8  --    Assessment / Plan: 1.  Multiple masses of the abdomen and lung: Suspicion of primary lung cancer, biopsy planned for Friday 7/22 after Plavix washout. 2.  Abdominal pain: Likely from liver mass. 3.  Weight loss:  With a decrease in appetite/aversion to food, about 20 pounds over the past 2 months; likely from cancer.  They did add Remeron 15 mg at bedtime to try to help her appetite. 4.  Diarrhea: Continues with some diarrhea although she reports that it is better today.  C. difficile negative.  GI pathogen panel positive for EPEC, azithromycin started 7/19 for 3 days.  **I had a long discussion with the patient and then her son, Sherren Mocha.  We discussed that we could consider at least and EGD and possibly flex sig to rule out other causes of her symptoms but now with the positive EPEC, let us give the abx some time to see if they help and how much they help.  Will reassess tomorrow and could try to add on for procedures tomorrow afternoon if needed.  I will tentatively make her NPO after midnight.  He says that her diarrhea has not really been a chronic issue, more acute over the past several days to a week or so, which makes me think that is the EPEC causing that.   LOS: 3 days   Laban Emperor. Orvilla Truett  05/21/2021, 11:16 AM

## 2021-05-21 NOTE — H&P (Addendum)
Chief Complaint: Patient was seen in consultation today for image guided liver lesion biopsy  Chief Complaint  Patient presents with   Abdominal Pain   Diarrhea   Failure To Thrive   at the request of Dr. Cyndia Skeeters, T  Referring Physician(s): Dr. Cyndia Skeeters, T  Supervising Physician: Jacqulynn Cadet  Patient Status: [facility] - [facility]  History of Present Illness: Karla Chavez is a 86 y.o. female with past medical history of HTN, HLD, GERD, pneumonia, aortic aneurysm, colon polyp, LLL nodule, and recent findings of liver lesions. Patient presented to [facility] ED on 05/18/2021 due to worsening abdominal pain, underwent CT chest abdomen pelvis with contrast which showed:  1. Bandlike density in the left lower lobe that extends to the pleural surface, suggestive of scarring. Interval change in morphology of previously noted ground-glass density in the left lower lobe associated with the linear density, now appears more solid and spiculated, measuring up to 2.7 cm and concerning for lung carcinoma. Pulmonary consultation is recommended. 2. Multiple ill-defined hypodense liver lesions concerning for metastatic disease 3. 5.6 cm complex lobulated cyst mid pole left kidney. When the patient is clinically stable and able to follow directions and hold their breath (preferably as an outpatient) further evaluation with dedicated abdominal MRI should be considered  Patient is and currently admitted due to failure to thrive, GI was consulted for evaluation of abdominal pain and associated weight loss and diarrhea.  IR was requested for image guided liver lesion biopsy on 05/19/2021.  Patient laying in bed, not in acute distress, son at the bedside. Reports appetite is somewhat better today. Still have diarrhea and abdominal pain.  Denise headache, fever, chills, shortness of breath, cough, chest pain, vomiting, and bleeding.  Patient states that she had dizzy spells and was evaluated by her  neurosurgeon, was told that she has small clots in her brain. She was placed on Plavix by her neurosurgeon. Care everywhere shows that she was diagnosed with asymptomatic stenosis of intracranial artery on 10/09/2020 at [place].    Past Medical History:  Diagnosis Date   Adenomatous colon polyp 03/2010   Allergy    Aortic aneurysm ([place])    Arthritis    "joints" (03/01/2014)   Family history of anesthesia complication    pts sister has severe nausea and vomiting   GERD (gastroesophageal reflux disease)    History of blood transfusion 1958   "related to childbirth"   Hyperlipidemia    Hypertension    Near syncope    Palpitations    Pneumonia 06/2009   PONV (postoperative nausea and vomiting)    "this OR" (03/01/2014)   Vertigo    hx of occured on 01/01/14    Past Surgical History:  Procedure Laterality Date   [place] TEST  01/09/2008   EF 82%   CERVICAL [place]E BIOPSY  1970's   COLONOSCOPY     PANENDOSCOPY     PAROTIDECTOMY Left 03/01/2104   superficial with facial nerve preservation    PAROTIDECTOMY Left 03/01/2014   Procedure: LEFT SUPERFICIAL  VS TOTAL PAROTIDECTOMY;  Surgeon: Jodi Marble, MD;  Location: [place];  Service: ENT;  Laterality: Left;   [place]   tumor in neck  2015    Allergies: Benazepril hcl  Medications: Prior to Admission medications   Medication Sig Start Date End Date Taking? Authorizing Provider  ALPRAZolam Duanne Moron) 0.5 MG tablet Take 0.5 mg by mouth at bedtime as needed for  anxiety.   Yes [provider]  amLODipine (NORVASC) 5 MG tablet Take 1 tablet (5 mg total) by mouth daily. 10/04/17  Yes Martinique, Peter M, MD  atorvastatin (LIPITOR) 80 MG tablet Take 1 tablet (80 mg total) by mouth daily. 03/12/20  Yes Martinique, Peter M, MD  citalopram (CELEXA) 10 MG tablet Take 10 mg by mouth daily.   Yes [provider]  clopidogrel (PLAVIX) 75 MG tablet Take by mouth. 09/12/20  Yes  [provider]  metoprolol tartrate (LOPRESSOR) 25 MG tablet Take 1 tablet (25 mg total) by mouth 2 (two) times daily. NEED OV. Patient taking differently: Take 25 mg by mouth 2 (two) times daily. 01/03/18  Yes Martinique, Peter M, MD  olmesartan (BENICAR) 20 MG tablet TAKE 1 TABLET BY MOUTH EVERY DAY 01/20/21  Yes Martinique, Peter M, MD     Family History  Problem Relation Age of Onset   Hypertension Mother    Stroke Mother 40   Heart disease Father 52       massive MI   Heart disease Sister 63       CABG   Heart disease Brother 67       CABG    Social History   Socioeconomic History   Marital status: Married    Spouse name: Not on file   Number of children: 4   Years of education: Not on file   Highest education level: Not on file  Occupational History   Occupation: retired  Tobacco Use   Smoking status: Never   Smokeless tobacco: Never  Substance and Sexual Activity   Alcohol use: No   Drug use: No   Sexual activity: Never  Other Topics Concern   Not on file  Social History Narrative   Not on file   Social Determinants of Health   Financial Resource Strain: Not on file  Food Insecurity: Not on file  Transportation Needs: Not on file  Physical Activity: Not on file  Stress: Not on file  Social Connections: Not on file     Review of Systems: A 12 point ROS discussed and pertinent positives are indicated in the HPI above.  All other systems are negative.   Vital Signs: BP 131/63 (BP Location: Left Arm)   Pulse 73   Temp (!) 97.4 F (36.3 C) (Oral)   Resp 18   Ht 5\' 7"  (1.702 m)   Wt 138 lb 0.1 oz (62.6 kg)   LMP  (LMP Unknown)   SpO2 94%   BMI 21.62 kg/m   Physical Exam Vitals reviewed.  Constitutional:      General: She is not in acute distress.    Appearance: She is well-developed. She is not ill-appearing.  HENT:     Head: Normocephalic and atraumatic.  Cardiovascular:     Rate and Rhythm: Rhythm irregular.     Comments: Irregularly  irregular, pt states that she was told she has "skipping beats" by her cardiologist.  Mild systolic murmur at MV  Pulmonary:     Effort: Pulmonary effort is normal. No respiratory distress.     Breath sounds: Normal breath sounds.  Abdominal:     General: Abdomen is flat. Bowel sounds are normal.     Palpations: Abdomen is soft.  Skin:    General: Skin is warm and dry.     Coloration: Skin is not cyanotic or jaundiced.  Neurological:     Mental Status: She is alert and oriented to person, place, and  time.  Psychiatric:        Mood and Affect: Mood normal.        Behavior: Behavior normal.    MD Evaluation Airway: WNL Heart: WNL Abdomen: WNL Chest/ Lungs: WNL ASA  Classification: 3 Mallampati/Airway Score: Two  Imaging: CT Head Wo Contrast  Result Date: 05/18/2021 CLINICAL DATA:  Abdomen pain diarrhea EXAM: CT HEAD WITHOUT CONTRAST TECHNIQUE: Contiguous axial images were obtained from the base of the skull through the vertex without intravenous contrast. COMPARISON:  MRI brain 01/02/2014 FINDINGS: Brain: No acute territorial infarction, hemorrhage or intracranial mass. Moderate atrophy. Cerebellar atrophy. Nonenlarged ventricles. Patchy white matter hypodensity consistent with chronic small vessel ischemic change Vascular: No hyperdense vessels.  Carotid vascular calcification Skull: Normal. Negative for fracture or focal lesion. Sinuses/Orbits: No acute finding. Other: None IMPRESSION: 1. No CT evidence for acute intracranial abnormality. 2. Cortical and cerebellar atrophy. Chronic small vessel ischemic changes of the white matter Electronically Signed   By: Donavan Foil M.D.   On: 05/18/2021 22:15   CT Chest W Contrast  Result Date: 05/18/2021 CLINICAL DATA:  GI issues poor p.o. intake abdomen pain EXAM: CT CHEST, ABDOMEN, AND PELVIS WITH CONTRAST TECHNIQUE: Multidetector CT imaging of the chest, abdomen and pelvis was performed following the standard protocol during bolus  administration of intravenous contrast. CONTRAST:  38mL OMNIPAQUE IOHEXOL 350 MG/ML SOLN COMPARISON:  MRI 01/08/2020, CT 12/02/2015, ultrasound 12/22/2005 FINDINGS: CT CHEST FINDINGS Cardiovascular: Mild aortic atherosclerosis. No aneurysm. Coronary vascular calcification. Normal cardiac size. No pericardial effusion Mediastinum/Nodes: Midline trachea. Subcentimeter left thyroid nodule, no further workup recommended based on age of patient and size of lesion. No suspicious adenopathy. Esophagus within normal limits Lungs/Pleura: Biapical pleural and parenchymal scarring. Bandlike density in the left lower lobe extending to the pleural surface as noted on previous and suggestive of scarring. Interval change in morphology of sub solid area in the superior left lower lobe, now appears more solid and spiculated, this measures 2.7 cm AP x 1.5 cm transverse by 2.4 cm craniocaudad. Given interval change in morphology, findings are concerning for lung carcinoma. Musculoskeletal: Sternum is intact.  No suspicious bone lesion. CT ABDOMEN PELVIS FINDINGS Hepatobiliary: Ill-defined multiple hypodense liver nodules. No calcified gallstone or biliary dilatation Pancreas: Unremarkable. No pancreatic ductal dilatation or surrounding inflammatory changes. Spleen: Normal in size without focal abnormality. Adrenals/Urinary Tract: Adrenal glands are normal. Kidneys show no hydronephrosis. Subcentimeter cortical hypodense renal lesions, too small to further characterize. 5.6 by 4 by 4 cm complex lobulated cyst in the left kidney containing septations and calcification. Urinary bladder is unremarkable. Stomach/Bowel: The stomach is nonenlarged. No dilated small bowel. No acute bowel wall thickening. The appendix is not well visualized. Scattered diverticular disease. Vascular/Lymphatic: Moderate aortic atherosclerosis. No aneurysm. No suspicious nodes Reproductive: Uterus and bilateral adnexa are unremarkable. Other: Negative for free  air or free fluid. Musculoskeletal: Degenerative changes of the spine. Small focal sclerosis at T12, possibly a small bone island. IMPRESSION: 1. Bandlike density in the left lower lobe that extends to the pleural surface, suggestive of scarring. Interval change in morphology of previously noted ground-glass density in the left lower lobe associated with the linear density, now appears more solid and spiculated, measuring up to 2.7 cm and concerning for lung carcinoma. Pulmonary consultation is recommended. 2. Multiple ill-defined hypodense liver lesions concerning for metastatic disease 3. 5.6 cm complex lobulated cyst mid pole left kidney. When the patient is clinically stable and able to follow directions and hold their breath (preferably as  an outpatient) further evaluation with dedicated abdominal MRI should be considered. Electronically Signed   By: Donavan Foil M.D.   On: 05/18/2021 22:36   CT ABDOMEN PELVIS W CONTRAST  Result Date: 05/18/2021 CLINICAL DATA:  GI issues poor p.o. intake abdomen pain EXAM: CT CHEST, ABDOMEN, AND PELVIS WITH CONTRAST TECHNIQUE: Multidetector CT imaging of the chest, abdomen and pelvis was performed following the standard protocol during bolus administration of intravenous contrast. CONTRAST:  22mL OMNIPAQUE IOHEXOL 350 MG/ML SOLN COMPARISON:  MRI 01/08/2020, CT 12/02/2015, ultrasound 12/22/2005 FINDINGS: CT CHEST FINDINGS Cardiovascular: Mild aortic atherosclerosis. No aneurysm. Coronary vascular calcification. Normal cardiac size. No pericardial effusion Mediastinum/Nodes: Midline trachea. Subcentimeter left thyroid nodule, no further workup recommended based on age of patient and size of lesion. No suspicious adenopathy. Esophagus within normal limits Lungs/Pleura: Biapical pleural and parenchymal scarring. Bandlike density in the left lower lobe extending to the pleural surface as noted on previous and suggestive of scarring. Interval change in morphology of sub solid  area in the superior left lower lobe, now appears more solid and spiculated, this measures 2.7 cm AP x 1.5 cm transverse by 2.4 cm craniocaudad. Given interval change in morphology, findings are concerning for lung carcinoma. Musculoskeletal: Sternum is intact.  No suspicious bone lesion. CT ABDOMEN PELVIS FINDINGS Hepatobiliary: Ill-defined multiple hypodense liver nodules. No calcified gallstone or biliary dilatation Pancreas: Unremarkable. No pancreatic ductal dilatation or surrounding inflammatory changes. Spleen: Normal in size without focal abnormality. Adrenals/Urinary Tract: Adrenal glands are normal. Kidneys show no hydronephrosis. Subcentimeter cortical hypodense renal lesions, too small to further characterize. 5.6 by 4 by 4 cm complex lobulated cyst in the left kidney containing septations and calcification. Urinary bladder is unremarkable. Stomach/Bowel: The stomach is nonenlarged. No dilated small bowel. No acute bowel wall thickening. The appendix is not well visualized. Scattered diverticular disease. Vascular/Lymphatic: Moderate aortic atherosclerosis. No aneurysm. No suspicious nodes Reproductive: Uterus and bilateral adnexa are unremarkable. Other: Negative for free air or free fluid. Musculoskeletal: Degenerative changes of the spine. Small focal sclerosis at T12, possibly a small bone island. IMPRESSION: 1. Bandlike density in the left lower lobe that extends to the pleural surface, suggestive of scarring. Interval change in morphology of previously noted ground-glass density in the left lower lobe associated with the linear density, now appears more solid and spiculated, measuring up to 2.7 cm and concerning for lung carcinoma. Pulmonary consultation is recommended. 2. Multiple ill-defined hypodense liver lesions concerning for metastatic disease 3. 5.6 cm complex lobulated cyst mid pole left kidney. When the patient is clinically stable and able to follow directions and hold their breath  (preferably as an outpatient) further evaluation with dedicated abdominal MRI should be considered. Electronically Signed   By: Donavan Foil M.D.   On: 05/18/2021 22:36    Labs:  CBC: Recent Labs    05/18/21 2021 05/19/21 0351 05/20/21 0735 05/21/21 0403  WBC 8.6 5.9 6.9 6.7  HGB 13.2 12.1 12.5 11.9*  HCT 39.1 35.5* 36.7 35.6*  PLT 295 229 238 269    COAGS: No results for input(s): INR, APTT in the last 8760 hours.  BMP: Recent Labs    05/18/21 2021 05/19/21 0351 05/20/21 0735 05/21/21 0403  NA 139 142 143 143  K 3.5 3.3* 3.4* 3.9  CL 107 108 113* 114*  CO2 21* 24 22 19*  GLUCOSE 121* 145* 102* 97  BUN 20 15 14 17   CALCIUM 9.4 8.8* 9.2 9.0  CREATININE 0.74 0.63 0.60 0.54  GFRNONAA >60 >60 >  60 >60    LIVER FUNCTION TESTS: Recent Labs    05/18/21 2021 05/19/21 0351 05/20/21 0735 05/21/21 0403  BILITOT 0.7 0.5 0.8  --   AST 45* 40 40  --   ALT 23 21 25   --   ALKPHOS 134* 123 129*  --   PROT 6.3* 5.7* 5.9*  --   ALBUMIN 3.3* 3.1* 3.0* 3.0*    TUMOR MARKERS: No results for input(s): AFPTM, CEA, CA199, CHROMGRNA in the last 8760 hours.  Assessment and Plan: 86 y.o. female with recent findings of LLL lung nodule and multiple liver lesion, concerning for metastatic disease.  Patient is currently admitted due to failure to thrive.  GI was consulted for evaluation of abdominal pain and associated weight loss and diarrhea.  IR was requested for image guided liver lesion biopsy on 05/19/2021.  Case was reviewed and approved by Dr. Kathlene Cote, however, it was found that patient was on Plavix.  The Plavix requires 5-day hold per IR anticoagulation guideline for high bleeding risk procedures such as liver lesion biopsy. Primary team was informed, and has been holding Plavix since 05/18/2021.  The procedure is tentatively schedule for Friday, 05/23/2021 pending IR schedule. If patient were to be discharged prior to Friday, this procedure can be done as an outpatient basis.   Please contact centralized scheduling to schedule outpatient biopsy.   Made n.p.o. at midnight on Friday VSS CBC on 05/20/2021 all within normal limit INR on 05/13/2021 0.9  On subcu Lovenox 40 mg daily, Thursday and Friday dose held.  Subcu Lovenox can be resumed 6 hours after the liver biopsy. Plavix can be resumed right after the procedure.   Risks and benefits of liver lesion was discussed with the patient and/or patient's family including, but not limited to bleeding, infection, damage to adjacent structures or low yield requiring additional tests.  All of the questions were answered and there is agreement to proceed.  Consent signed and in chart.    Thank you for this interesting consult.  I greatly enjoyed meeting JAYLEEN SCAGLIONE and look forward to participating in their care.  A copy of this report was sent to the requesting provider on this date.  Electronically Signed: Tera Mater, PA-C 05/21/2021, 4:31 PM   I spent a total of 40 Minutes    in face to face in clinical consultation, greater than 50% of which was counseling/coordinating care for liver lesion bx

## 2021-05-21 NOTE — Care Management Important Message (Signed)
Important Message  Patient Details IM Letter placed in Patient's room. Name: Karla Chavez MRN: [id number] Date of Birth: 16-Nov-1934   Medicare Important Message Given:  Yes     Kerin Salen 05/21/2021, 2:15 PM

## 2021-05-21 NOTE — Progress Notes (Signed)
PROGRESS NOTE  Karla Chavez NLZ:[id number] DOB: Feb 10, 1935   PCP: Aletha Halim., PA-C  Patient is from: Home.  DOA: 05/18/2021 LOS: 3  Chief complaints:  Chief Complaint  Patient presents with   Abdominal Pain   Diarrhea   Failure To Thrive     Brief Narrative / Interim history: 86 year old F with PMH of HTN, HLD, vertigo, LLL nodule, osteoarthritis, GERD, appendectomy and aortic aneurysm presenting with intermittent diarrhea, abdominal pain, decreased appetite and about 15 to 20 pound unintentional weight loss in the last 2 months.  She had CT abdomen and pelvis on 7/12 that showed multiple liver lesions, wall thickening in transverse colon and large complex left renal cyst. She had GI appointment for 7/18 but pain acutely gotten worse and prompted her to come to ED.    In ED, slightly tachycardic.  Lipase 93.  CT chest/abdomen/pelvis showed LLL density extending to the pleural surface with interval change in morphology of some GG density in the left lower lobe solid and spiculated mass about 2.7 cm concerning for lung cancer, as well as multiple ill-defined hypodense liver lesions concerning for metastasis and 5.6 cm complex lobulated cyst in the midpole left kidney.  IR consulted for biopsy but recommended waiting for Plavix washout until 7/22.  C. difficile negative.  GIP with EPEC.  Started on azithromycin.  GI following and planning EGD+/- sigmoidoscopy if no improvement with antibiotics.  P.o. intake remains poor.  Still with diarrhea.  Calorie count underway    Subjective: Seen and examined earlier this morning.  No major events overnight or this morning.  Still with frequent diarrhea.  Abdominal pain improved.  P.o. intake remains poor.  Patient's son concerned about her p.o. intake  Objective: Vitals:   05/20/21 1724 05/20/21 2013 05/21/21 0455 05/21/21 1347  BP: (!) 146/89 (!) 156/79 (!) 141/63 131/63  Pulse: 69 81 84 73  Resp: 13 18 18 18   Temp: 97.7 F (36.5  C) (!) 97.4 F (36.3 C) 98.2 F (36.8 C) (!) 97.4 F (36.3 C)  TempSrc: Oral Oral Oral Oral  SpO2: 96% 94% 100% 94%  Weight:      Height:        Intake/Output Summary (Last 24 hours) at 05/21/2021 1530 Last data filed at 05/21/2021 1400 Gross per 24 hour  Intake 3228.09 ml  Output 1000 ml  Net 2228.09 ml   Filed Weights   05/19/21 0042  Weight: 62.6 kg    Examination:  GENERAL: No apparent distress.  Nontoxic. HEENT: MMM.  Vision and hearing grossly intact.  NECK: Supple.  No apparent JVD.  RESP: On RA.  No IWOB.  Fair aeration bilaterally. CVS:  RRR. Heart sounds normal.  ABD/GI/GU: BS+. Abd soft.  Epigastric tenderness. MSK/EXT:  Moves extremities. No apparent deformity. No edema.  SKIN: no apparent skin lesion or wound NEURO: Awake and alert. Oriented appropriately.  No apparent focal neuro deficit. PSYCH: Calm. Normal affect.   Procedures:  None  Microbiology summarized: [id number] and influenza PCR nonreactive.  Assessment & Plan: LLL nodule/mass/multiple ill-defined hypodense liver lesions-CT shows interval change in morphology of previously noted 2.7 cm GG density in LLL that appears more solid and spiculated concerning for lung cancer, and multiple liver lesions concerning for metastatic disease. -Per IR, needle biopsy after Plavix washout, possibly 7/22 -Ambulatory referral to oncology ordered  Abdominal pain/epigastric tenderness-could be due to gastritis/gastric ulcer, colitis, or liver lesions Infectious diarrhea due to EPEC-started azithromycin 500 mg daily on 7/19.  C. difficile  negative. Decreased appetite/oral intake/unintentional weight loss-multifactorial. Unintentional weight loss Elevated lipase-improving.  No radiologic evidence of pancreatitis -GI following-planning to scope if no improvement with antibiotics -P.o. Protonix 40 mg twice daily -Continue regular diet and supplements -Continue IV fluid -Increase Remeron to 15 mg nightly -Continue  azithromycin 500 mg daily for 3 days -Continue multivitamin -Calorie count.  RD consulted for help  Large complex complex lobulated left renal cyst: Noted on CT. measures 5.6 x 4 x 4 cm.  Or 3.1 cm on [place] in 2007 and 2011. -Need further evaluation with abdominal MRI once clinically stable unable to hold breath -Discussed this with patient and patient's son at bedside.  Essential hypertension: Normotensive. -Continue home amlodipine, metoprolol and Avapro   Hyperlipidemia:  -Continue home statin.   Anxiety and depression: Stable. -Continue home medications -Added Remeron mainly for appetite.  Shortness of breath: Normal saturation on room air.  Lung exam reassuring.  Could be due to #1. -Encourage incentive spirometry  Ascending aortic aneurysm: 3.8 cm  on CT angio chest in 2017. -Needs on annual imaging on this  Hypokalemia/hypomagnesemia: Resolved.  Metabolic acidosis: Likely from IV fluid and diarrhea. -Management as above     Unintentional weight loss Body mass index is 21.62 kg/m. Nutrition Problem: Inadequate oral intake Etiology: poor appetite, diarrhea Signs/Symptoms: per patient/family report Interventions: MVI, Prostat, Snacks, Magic cup   DVT prophylaxis:  enoxaparin (LOVENOX) injection 40 mg Start: 05/19/21 0600  Code Status: Full code Family Communication: Patient and/or RN.  Updated patient's sons at bedside and over the phone Level of care: Med-Surg Status is: Inpatient  Remains inpatient appropriate because:IV treatments appropriate due to intensity of illness or inability to take PO and Inpatient level of care appropriate due to severity of illness  Dispo: The patient is from: Home              Anticipated d/c is to: Home              Patient currently is not medically stable to d/c.   Difficult to place patient No       Consultants:  Interventional radiology-signed off Gastroenterology   Sch Meds:  Scheduled Meds:  (feeding supplement)  PROSource Plus  30 mL Oral BID BM   amLODipine  5 mg Oral Daily   atorvastatin  80 mg Oral Daily   citalopram  10 mg Oral Daily   enoxaparin (LOVENOX) injection  40 mg Subcutaneous Q24H   feeding supplement  1 Container Oral TID BM   irbesartan  150 mg Oral Daily   metoprolol tartrate  25 mg Oral BID   mirtazapine  15 mg Oral QHS   pantoprazole  40 mg Oral BID   protein supplement  1 Scoop Oral TID WC   Continuous Infusions:  0.45 % NaCl with KCl 20 mEq / L 100 mL/hr at 05/21/21 1328   azithromycin 500 mg (05/21/21 0847)    PRN Meds:.acetaminophen **OR** acetaminophen, ALPRAZolam, alum & mag hydroxide-simeth, loperamide, ondansetron **OR** ondansetron (ZOFRAN) IV  Antimicrobials: Anti-infectives (From admission, onward)    Start     Dose/Rate Route Frequency Ordered Stop   05/20/21 1700  azithromycin (ZITHROMAX) 500 mg in sodium chloride 0.9 % 250 mL IVPB        500 mg 250 mL/hr over 60 Minutes Intravenous Daily 05/20/21 1607 05/23/21 0959        I have personally reviewed the following labs and images: CBC: Recent Labs  Lab 05/18/21 2021 05/19/21 0351 05/20/21 0735 05/21/21  0403  WBC 8.6 5.9 6.9 6.7  HGB 13.2 12.1 12.5 11.9*  HCT 39.1 35.5* 36.7 35.6*  MCV 91.6 92.7 92.7 93.0  PLT 295 229 238 269   BMP &GFR Recent Labs  Lab 05/18/21 2021 05/19/21 0351 05/20/21 0735 05/21/21 0403  NA 139 142 143 143  K 3.5 3.3* 3.4* 3.9  CL 107 108 113* 114*  CO2 21* 24 22 19*  GLUCOSE 121* 145* 102* 97  BUN 20 15 14 17   CREATININE 0.74 0.63 0.60 0.54  CALCIUM 9.4 8.8* 9.2 9.0  MG  --   --  1.5* 1.8  PHOS  --   --  3.6 3.2   Estimated Creatinine Clearance: 50.9 mL/min (by C-G formula based on SCr of 0.54 mg/dL). Liver & Pancreas: Recent Labs  Lab 05/18/21 2021 05/19/21 0351 05/20/21 0735 05/21/21 0403  AST 45* 40 40  --   ALT 23 21 25   --   ALKPHOS 134* 123 129*  --   BILITOT 0.7 0.5 0.8  --   PROT 6.3* 5.7* 5.9*  --   ALBUMIN 3.3* 3.1* 3.0* 3.0*   Recent  Labs  Lab 05/18/21 2021 05/19/21 0706 05/20/21 0735 05/21/21 0403  LIPASE 93* 91* 66* 70*   No results for input(s): AMMONIA in the last 168 hours. Diabetic: No results for input(s): HGBA1C in the last 72 hours. No results for input(s): GLUCAP in the last 168 hours. Cardiac Enzymes: No results for input(s): CKTOTAL, CKMB, CKMBINDEX, TROPONINI in the last 168 hours. No results for input(s): PROBNP in the last 8760 hours. Coagulation Profile: No results for input(s): INR, PROTIME in the last 168 hours. Thyroid Function Tests: No results for input(s): TSH, T4TOTAL, FREET4, T3FREE, THYROIDAB in the last 72 hours. Lipid Profile: No results for input(s): CHOL, HDL, LDLCALC, TRIG, CHOLHDL, LDLDIRECT in the last 72 hours. Anemia Panel: No results for input(s): VITAMINB12, FOLATE, FERRITIN, TIBC, IRON, RETICCTPCT in the last 72 hours. Urine analysis:    Component Value Date/Time   COLORURINE YELLOW 05/18/2021 2000   APPEARANCEUR CLEAR 05/18/2021 2000   LABSPEC 1.020 05/18/2021 2000   PHURINE 6.0 05/18/2021 2000   GLUCOSEU NEGATIVE 05/18/2021 2000   HGBUR MODERATE (A) 05/18/2021 2000   BILIRUBINUR NEGATIVE 05/18/2021 2000   KETONESUR NEGATIVE 05/18/2021 2000   PROTEINUR NEGATIVE 05/18/2021 2000   UROBILINOGEN 0.2 06/27/2009 0855   NITRITE NEGATIVE 05/18/2021 2000   LEUKOCYTESUR NEGATIVE 05/18/2021 2000   Sepsis Labs: Invalid input(s): PROCALCITONIN, [place]  Microbiology: Recent Results (from the past 240 hour(s))  Resp Panel by RT-PCR (Flu A&B, Covid) Nasopharyngeal Swab     Status: None   Collection Time: 05/18/21  8:21 PM   Specimen: Nasopharyngeal Swab; Nasopharyngeal(NP) swabs in vial transport medium  Result Value Ref Range Status   SARS Coronavirus 2 by RT PCR NEGATIVE NEGATIVE Final    Comment: (NOTE) SARS-CoV-2 target nucleic acids are NOT DETECTED.  The SARS-CoV-2 RNA is generally detectable in upper respiratory specimens during the acute phase of infection.  The lowest concentration of SARS-CoV-2 viral copies this assay can detect is 138 copies/mL. A negative result does not preclude SARS-Cov-2 infection and should not be used as the sole basis for treatment or other patient management decisions. A negative result may occur with  improper specimen collection/handling, submission of specimen other than nasopharyngeal swab, presence of viral mutation(s) within the areas targeted by this assay, and inadequate number of viral copies(<138 copies/mL). A negative result must be combined with clinical observations, patient history, and  epidemiological information. The expected result is Negative.  Fact Sheet for Patients:  EntrepreneurPulse.com.au  Fact Sheet for Healthcare Providers:  IncredibleEmployment.be  This test is no t yet approved or cleared by the [place] FDA and  has been authorized for detection and/or diagnosis of SARS-CoV-2 by FDA under an Emergency Use Authorization (EUA). This EUA will remain  in effect (meaning this test can be used) for the duration of the COVID-19 declaration under Section 564(b)(1) of the Act, 21 U.S.C.section 360bbb-3(b)(1), unless the authorization is terminated  or revoked sooner.       Influenza A by PCR NEGATIVE NEGATIVE Final   Influenza B by PCR NEGATIVE NEGATIVE Final    Comment: (NOTE) The Xpert Xpress SARS-CoV-2/FLU/RSV plus assay is intended as an aid in the diagnosis of influenza from Nasopharyngeal swab specimens and should not be used as a sole basis for treatment. Nasal washings and aspirates are unacceptable for Xpert Xpress SARS-CoV-2/FLU/RSV testing.  Fact Sheet for Patients: EntrepreneurPulse.com.au  Fact Sheet for Healthcare Providers: IncredibleEmployment.be  This test is not yet approved or cleared by the [place] FDA and has been authorized for detection and/or diagnosis of SARS-CoV-2 by FDA  under an Emergency Use Authorization (EUA). This EUA will remain in effect (meaning this test can be used) for the duration of the COVID-19 declaration under Section 564(b)(1) of the Act, 21 U.S.C. section 360bbb-3(b)(1), unless the authorization is terminated or revoked.  Performed at [facility], [place] [address] [place], [place] [address]   Gastrointestinal Panel by PCR , Stool     Status: Abnormal   Collection Time: 05/19/21 11:31 AM   Specimen: Stool  Result Value Ref Range Status   Campylobacter species NOT DETECTED NOT DETECTED Final   Plesimonas shigelloides NOT DETECTED NOT DETECTED Final   Salmonella species NOT DETECTED NOT DETECTED Final   Yersinia enterocolitica NOT DETECTED NOT DETECTED Final   Vibrio species NOT DETECTED NOT DETECTED Final   Vibrio cholerae NOT DETECTED NOT DETECTED Final   Enteroaggregative E coli (EAEC) NOT DETECTED NOT DETECTED Final   Enteropathogenic E coli (EPEC) DETECTED (A) NOT DETECTED Final    Comment: Baldwin [place], RN AT 6045 ON 05/20/21 BY [facility]   Enterotoxigenic E coli (ETEC) NOT DETECTED NOT DETECTED Final   Shiga like toxin producing E coli (STEC) NOT DETECTED NOT DETECTED Final   Shigella/Enteroinvasive E coli (EIEC) NOT DETECTED NOT DETECTED Final   Cryptosporidium NOT DETECTED NOT DETECTED Final   Cyclospora cayetanensis NOT DETECTED NOT DETECTED Final   Entamoeba histolytica NOT DETECTED NOT DETECTED Final   Giardia lamblia NOT DETECTED NOT DETECTED Final   Adenovirus F40/41 NOT DETECTED NOT DETECTED Final   Astrovirus NOT DETECTED NOT DETECTED Final   Norovirus GI/GII NOT DETECTED NOT DETECTED Final   Rotavirus A NOT DETECTED NOT DETECTED Final   Sapovirus (I, II, IV, and V) NOT DETECTED NOT DETECTED Final    Comment: Performed at [facility], [address] [place], [place] [address]  C Difficile Quick Screen w PCR reflex     Status: None   Collection Time: 05/19/21 11:31 AM   Specimen: Stool   Result Value Ref Range Status   C Diff antigen NEGATIVE NEGATIVE Final   C Diff toxin NEGATIVE NEGATIVE Final   C Diff interpretation No C. difficile detected.  Final    Comment: Performed at [facility], [place] [address] [place],  [address]    Radiology Studies: No results found.  Jamesia Linnen T. [place]  If 7PM-7AM, please contact night-coverage [email or web address] 05/21/2021, 3:30 PM

## 2021-05-21 NOTE — Progress Notes (Signed)
Mobility Specialist - Progress Note    05/21/21 1455  Mobility  Activity Ambulated in hall  Level of Assistance Contact guard assist, steadying assist  Assistive Device None  Distance Ambulated (ft) 110 ft  Mobility Ambulated with assistance in hallway  Mobility Response Tolerated well  Mobility performed by Mobility specialist  $Mobility charge 1 Mobility    Pt ambulated 110 ft in hallway using no assistive device. Pt tolerated session well and was eager to walk. Pt did experience SOB towards end of ambulation and began to feel "wobbly" and upon returning to bed, pt stated feeling "worn out" from walking. Pt left in bed with bed alarm on, call bell at side, and family in room.   [place] Specialist [facility] Phone: [phone] 05/21/21, 2:58 PM

## 2021-05-21 NOTE — Progress Notes (Signed)
I received referral on Karla Chavez.  She is in the hospital at this time. I did update new patient coordinator to call and schedule her to be seen with Dr. Julien Nordmann on 7/25.

## 2021-05-22 DIAGNOSIS — R627 Adult failure to thrive: Secondary | ICD-10-CM | POA: Diagnosis not present

## 2021-05-22 DIAGNOSIS — R160 Hepatomegaly, not elsewhere classified: Secondary | ICD-10-CM | POA: Diagnosis not present

## 2021-05-22 DIAGNOSIS — K769 Liver disease, unspecified: Secondary | ICD-10-CM | POA: Diagnosis not present

## 2021-05-22 DIAGNOSIS — R918 Other nonspecific abnormal finding of lung field: Secondary | ICD-10-CM | POA: Diagnosis not present

## 2021-05-22 DIAGNOSIS — R1013 Epigastric pain: Secondary | ICD-10-CM | POA: Diagnosis not present

## 2021-05-22 DIAGNOSIS — I10 Essential (primary) hypertension: Secondary | ICD-10-CM | POA: Diagnosis not present

## 2021-05-22 DIAGNOSIS — R7989 Other specified abnormal findings of blood chemistry: Secondary | ICD-10-CM

## 2021-05-22 LAB — RENAL FUNCTION PANEL
Albumin: 2.8 g/dL — ABNORMAL LOW (ref 3.5–5.0)
Anion gap: 8 (ref 5–15)
BUN: 12 mg/dL (ref 8–23)
CO2: 20 mmol/L — ABNORMAL LOW (ref 22–32)
Calcium: 8.7 mg/dL — ABNORMAL LOW (ref 8.9–10.3)
Chloride: 115 mmol/L — ABNORMAL HIGH (ref 98–111)
Creatinine, Ser: 0.48 mg/dL (ref 0.44–1.00)
GFR, Estimated: >60 mL/min (ref >60)
Glucose, Bld: 94 mg/dL (ref 70–99)
Phosphorus: 2.9 mg/dL (ref 2.5–4.6)
Potassium: 3.6 mmol/L (ref 3.5–5.1)
Sodium: 143 mmol/L (ref 135–145)

## 2021-05-22 LAB — CBC
HCT: 35.1 % — ABNORMAL LOW (ref 36.0–46.0)
Hemoglobin: 11.9 g/dL — ABNORMAL LOW (ref 12.0–15.0)
MCH: 31.2 pg (ref 26.0–34.0)
MCHC: 33.9 g/dL (ref 30.0–36.0)
MCV: 92.1 fL (ref 80.0–100.0)
Platelets: 255 10*3/uL (ref 150–400)
RBC: 3.81 MIL/uL — ABNORMAL LOW (ref 3.87–5.11)
RDW: 11.8 % (ref 11.5–15.5)
WBC: 6.3 10*3/uL (ref 4.0–10.5)
nRBC: 0 % (ref 0.0–0.2)

## 2021-05-22 LAB — RETICULOCYTES
Immature Retic Fract: 14.4 % (ref 2.3–15.9)
RBC.: 3.85 MIL/uL — ABNORMAL LOW (ref 3.87–5.11)
Retic Count, Absolute: 51.6 10*3/uL (ref 19.0–186.0)
Retic Ct Pct: 1.3 % (ref 0.4–3.1)

## 2021-05-22 LAB — T4, FREE: Free T4: 2.54 ng/dL — ABNORMAL HIGH (ref 0.61–1.12)

## 2021-05-22 LAB — IRON AND TIBC
Iron: 40 ug/dL (ref 28–170)
Saturation Ratios: 23 % (ref 10.4–31.8)
TIBC: 172 ug/dL — ABNORMAL LOW (ref 250–450)
UIBC: 132 ug/dL

## 2021-05-22 LAB — TSH: TSH: 0.016 u[IU]/mL — ABNORMAL LOW (ref 0.350–4.500)

## 2021-05-22 LAB — FOLATE: Folate: 17.3 ng/mL (ref >5.9)

## 2021-05-22 LAB — FERRITIN: Ferritin: 717 ng/mL — ABNORMAL HIGH (ref 11–307)

## 2021-05-22 LAB — MAGNESIUM: Magnesium: 1.6 mg/dL — ABNORMAL LOW (ref 1.7–2.4)

## 2021-05-22 LAB — VITAMIN B12: Vitamin B-12: 365 pg/mL (ref 180–914)

## 2021-05-22 MED ORDER — ADULT MULTIVITAMIN W/MINERALS CH
1.0000 | ORAL_TABLET | Freq: Every day | ORAL | Status: DC
  Administered 2021-05-22 – 2021-06-01 (×10): 1 via ORAL
  Filled 2021-05-22 (×12): qty 1

## 2021-05-22 MED ORDER — PROSOURCE PLUS PO LIQD
30.0000 mL | Freq: Three times a day (TID) | ORAL | Status: DC
  Administered 2021-05-22 – 2021-06-01 (×21): 30 mL via ORAL
  Filled 2021-05-22 (×25): qty 30

## 2021-05-22 MED ORDER — MAGNESIUM SULFATE 2 GM/50ML IV SOLN
2.0000 g | Freq: Once | INTRAVENOUS | Status: AC
  Administered 2021-05-22: 2 g via INTRAVENOUS
  Filled 2021-05-22: qty 50

## 2021-05-22 NOTE — Progress Notes (Signed)
Nutrition Follow-up  INTERVENTION:   -Will resume Calorie Count now that pt is back on regular diet for another 24 hours.  -Resume Prosource Plus PO TID, each provides 100 kcals and 15g protein -Resume Beneprotein TID each provides 25 kcals and 6g protein -Resume daily snacks -Multivitamin with minerals daily  NUTRITION DIAGNOSIS:   Inadequate oral intake related to poor appetite, diarrhea as evidenced by per patient/family report.  Ongoing.  GOAL:   Patient will meet greater than or equal to 90% of their needs  Not meeting, poor PO and now NPO  MONITOR:   PO intake, Supplement acceptance, Labs, Weight trends, I & O's  REASON FOR ASSESSMENT:   Consult Assessment of nutrition requirement/status Calorie Count  ASSESSMENT:   86 year old F with PMH of HTN, HLD, vertigo, LLL nodule, osteoarthritis, GERD, appendectomy and aortic aneurysm presenting with intermittent diarrhea, abdominal pain, decreased appetite and about 15 to 20 pound unintentional weight loss in the last 2 months.  She had CT abdomen and pelvis on 7/12 that showed multiple liver lesions, wall thickening in transverse colon and large complex left renal cyst. She had GI appointment for 7/18 but pain acutely gotten worse and prompted her to come to ED.  Patient in room, sleeping. No family in room.  Per RN, pt was made NPO for today. But now order is back for regular diet. Per GI note, EGD and flex sig being held off for now. Order in for NPO after midnight for biopsy 7/22.  Will continue calorie count for lunch and dinner today.  7/20 B: 0% L: 25% ~150 kcals, 4g protein D: 0% Supplements: 250 kcals, 42g protein (2 Prosource, 2 Beneprotein) Total: 400 kcals (25% of needs), 46g protein (65% of needs)  7/21 B: 25% of muffin ~35 kcals, 0g protein  Medications: Remeron, IV Mg sulfate  Labs reviewed: Low Mg   Diet Order:   Diet Order     None       EDUCATION NEEDS:   No education needs have been  identified at this time  Skin:  Skin Assessment: Reviewed RN Assessment  Last BM:  7/21 -type 7  Height:   Ht Readings from Last 1 Encounters:  05/19/21 5\' 7"  (1.702 m)    Weight:   Wt Readings from Last 1 Encounters:  05/19/21 62.6 kg    BMI:  Body mass index is 21.62 kg/m.  Estimated Nutritional Needs:   Kcal:  1550-1750  Protein:  70-85g  Fluid:  1.7L/day   Clayton Bibles, MS, RD, LDN Inpatient Clinical Dietitian Contact information available via Amion

## 2021-05-22 NOTE — Plan of Care (Signed)

## 2021-05-22 NOTE — Progress Notes (Signed)
PROGRESS NOTE  Karla Chavez LYY:[id number] DOB: 03-Aug-1935   PCP: Aletha Halim., PA-C  Patient is from: Home.  DOA: 05/18/2021 LOS: 4  Chief complaints:  Chief Complaint  Patient presents with   Abdominal Pain   Diarrhea   Failure To Thrive     Brief Narrative / Interim history: 86 year old F with PMH of HTN, HLD, vertigo, LLL nodule, osteoarthritis, GERD, appendectomy and aortic aneurysm presenting with intermittent diarrhea, abdominal pain, decreased appetite and about 15 to 20 pound unintentional weight loss in the last 2 months.  She had CT abdomen and pelvis on 7/12 that showed multiple liver lesions, wall thickening in transverse colon and large complex left renal cyst. She had GI appointment for 7/18 but pain acutely gotten worse and prompted her to come to ED.    In ED, slightly tachycardic.  Lipase 93.  CT chest/abdomen/pelvis showed LLL density extending to the pleural surface with interval change in morphology of some GG density in the left lower lobe solid and spiculated mass about 2.7 cm concerning for lung cancer, as well as multiple ill-defined hypodense liver lesions concerning for metastasis and 5.6 cm complex lobulated cyst in the midpole left kidney.  IR consulted for biopsy but recommended waiting for Plavix washout until 7/22.  C. difficile negative.  GIP with EPEC.  Started on azithromycin.  GI following and planning EGD+/- sigmoidoscopy if no improvement with antibiotics.  P.o. intake remains poor.  Still with diarrhea.  Calorie count underway    Subjective: Seen and examined earlier this morning.  No major events overnight of this morning.  About 15 bowel movements charted in the last 24 hours although patient says her diarrhea has improved and the stool is not watery anymore.  She denies abdominal pain.  Appetite is a little better today.  She denies nausea or vomiting.  Her daughter is at bedside, and thinks she looked better today.  Objective: Vitals:    05/21/21 1347 05/21/21 2014 05/22/21 0515 05/22/21 1314  BP: 131/63 (!) 142/64 (!) 165/72 (!) 141/62  Pulse: 73 81 80 74  Resp: 18 18 18    Temp: (!) 97.4 F (36.3 C) 98 F (36.7 C) (!) 97.4 F (36.3 C) (!) 97.5 F (36.4 C)  TempSrc: Oral Oral Oral Oral  SpO2: 94% 96% 93% 96%  Weight:      Height:        Intake/Output Summary (Last 24 hours) at 05/22/2021 1336 Last data filed at 05/22/2021 1000 Gross per 24 hour  Intake 2126.11 ml  Output --  Net 2126.11 ml   Filed Weights   05/19/21 0042  Weight: 62.6 kg    Examination:  GENERAL: No apparent distress.  Nontoxic. HEENT: MMM.  Vision and hearing grossly intact.  NECK: Supple.  No apparent JVD.  RESP: On RA.  No IWOB.  Fair aeration bilaterally. CVS:  RRR. Heart sounds normal.  ABD/GI/GU: BS+. Abd soft, NTND.  MSK/EXT:  Moves extremities. No apparent deformity. No edema.  SKIN: no apparent skin lesion or wound NEURO: Awake and alert. Oriented appropriately.  No apparent focal neuro deficit. PSYCH: Calm. Normal affect.   Procedures:  None  Microbiology summarized: LEXNT-70 and influenza PCR nonreactive.  Assessment & Plan: LLL nodule/mass/multiple ill-defined hypodense liver lesions-CT shows interval change in morphology of previously noted 2.7 cm GG density in LLL that appears more solid and spiculated concerning for lung cancer, and multiple liver lesions concerning for metastatic disease. -Plan for liver biopsy by IR on 7/22 -Ambulatory  referral to oncology ordered  Abdominal pain/epigastric tenderness-could be due to gastritis/gastric ulcer, colitis, or liver lesions Infectious diarrhea due to EPEC-started azithromycin 500 mg daily on 7/19.  C. difficile negative. Decreased appetite/oral intake/unintentional weight loss-multifactorial.  Improving. Unintentional weight loss Elevated lipase-improving.  No radiologic evidence of pancreatitis -GI following-EGD and flex sig on hold -P.o. Protonix 40 mg twice  daily -Continue regular diet and supplements -Continue IV fluid -Continue Remeron 15 mg nightly -Completed 3 days of azithromycin on 7/21 -Continue multivitamin -Calorie count underway.   -Appreciate help by dietitian -TSH is low.  Check thyroid panel  Large complex complex lobulated left renal cyst: Noted on CT. measures 5.6 x 4 x 4 cm.  Or 3.1 cm on [place] in 2007 and 2011. -Need further evaluation with abdominal MRI once clinically stable unable to hold breath -Discussed this with patient and patient's son at bedside.  Essential hypertension: Normotensive. -Continue home amlodipine, metoprolol and Avapro   Hyperlipidemia:  -Continue home statin.   Anxiety and depression: Stable. -Continue home medications -Added Remeron mainly for appetite.  Shortness of breath: Normal saturation on room air.  Lung exam reassuring.  Could be due to #1.  Resolved. -Encourage incentive spirometry  Ascending aortic aneurysm: 3.8 cm  on CT angio chest in 2017. -Needs on annual imaging on this  Hypokalemia/hypomagnesemia: K3.6.  Mg 1.6. -K-Dur 40 mill equivalent x1 -IV magnesium sulfate 2 g x 1  Metabolic acidosis: Likely from IV fluid and diarrhea. -Management as above  Low TSH: 0.016. -Recheck TSH -Check free T4   Unintentional weight loss Body mass index is 21.62 kg/m. Nutrition Problem: Inadequate oral intake Etiology: poor appetite, diarrhea Signs/Symptoms: per patient/family report Interventions: MVI, Prostat, Snacks, Magic cup   DVT prophylaxis:  enoxaparin (LOVENOX) injection 40 mg Start: 05/19/21 0600  Code Status: Full code Family Communication: Patient and/or RN.  Updated patient's daughter at bedside. Level of care: Med-Surg Status is: Inpatient  Remains inpatient appropriate because:Persistent severe electrolyte disturbances, Ongoing diagnostic testing needed not appropriate for outpatient work up, IV treatments appropriate due to intensity of illness or inability to  take PO, and Inpatient level of care appropriate due to severity of illness  Dispo: The patient is from: Home              Anticipated d/c is to: Home              Patient currently is not medically stable to d/c.   Difficult to place patient No       Consultants:  Interventional radiology Gastroenterology   Sch Meds:  Scheduled Meds:  (feeding supplement) PROSource Plus  30 mL Oral TID BM   amLODipine  5 mg Oral Daily   atorvastatin  80 mg Oral Daily   citalopram  10 mg Oral Daily   enoxaparin (LOVENOX) injection  40 mg Subcutaneous Q24H   irbesartan  150 mg Oral Daily   metoprolol tartrate  25 mg Oral BID   mirtazapine  15 mg Oral QHS   multivitamin with minerals  1 tablet Oral Daily   pantoprazole  40 mg Oral BID   protein supplement  1 Scoop Oral TID WC   Continuous Infusions:  0.45 % NaCl with KCl 20 mEq / L 100 mL/hr at 05/22/21 0936    PRN Meds:.acetaminophen **OR** acetaminophen, ALPRAZolam, alum & mag hydroxide-simeth, loperamide, ondansetron **OR** ondansetron (ZOFRAN) IV  Antimicrobials: Anti-infectives (From admission, onward)    Start     Dose/Rate Route Frequency Ordered Stop  05/20/21 1700  azithromycin (ZITHROMAX) 500 mg in sodium chloride 0.9 % 250 mL IVPB        500 mg 250 mL/hr over 60 Minutes Intravenous Daily 05/20/21 1607 05/22/21 1045        I have personally reviewed the following labs and images: CBC: Recent Labs  Lab 05/18/21 2021 05/19/21 0351 05/20/21 0735 05/21/21 0403 05/22/21 0422  WBC 8.6 5.9 6.9 6.7 6.3  HGB 13.2 12.1 12.5 11.9* 11.9*  HCT 39.1 35.5* 36.7 35.6* 35.1*  MCV 91.6 92.7 92.7 93.0 92.1  PLT 295 229 238 269 255   BMP &GFR Recent Labs  Lab 05/18/21 2021 05/19/21 0351 05/20/21 0735 05/21/21 0403 05/22/21 0422  NA 139 142 143 143 143  K 3.5 3.3* 3.4* 3.9 3.6  CL 107 108 113* 114* 115*  CO2 21* 24 22 19* 20*  GLUCOSE 121* 145* 102* 97 94  BUN 20 15 14 17 12   CREATININE 0.74 0.63 0.60 0.54 0.48   CALCIUM 9.4 8.8* 9.2 9.0 8.7*  MG  --   --  1.5* 1.8 1.6*  PHOS  --   --  3.6 3.2 2.9   Estimated Creatinine Clearance: 50.9 mL/min (by C-G formula based on SCr of 0.48 mg/dL). Liver & Pancreas: Recent Labs  Lab 05/18/21 2021 05/19/21 0351 05/20/21 0735 05/21/21 0403 05/22/21 0422  AST 45* 40 40  --   --   ALT 23 21 25   --   --   ALKPHOS 134* 123 129*  --   --   BILITOT 0.7 0.5 0.8  --   --   PROT 6.3* 5.7* 5.9*  --   --   ALBUMIN 3.3* 3.1* 3.0* 3.0* 2.8*   Recent Labs  Lab 05/18/21 2021 05/19/21 0706 05/20/21 0735 05/21/21 0403  LIPASE 93* 91* 66* 70*   No results for input(s): AMMONIA in the last 168 hours. Diabetic: No results for input(s): HGBA1C in the last 72 hours. No results for input(s): GLUCAP in the last 168 hours. Cardiac Enzymes: No results for input(s): CKTOTAL, CKMB, CKMBINDEX, TROPONINI in the last 168 hours. No results for input(s): PROBNP in the last 8760 hours. Coagulation Profile: No results for input(s): INR, PROTIME in the last 168 hours. Thyroid Function Tests: Recent Labs    05/22/21 0422  TSH 0.016*   Lipid Profile: No results for input(s): CHOL, HDL, LDLCALC, TRIG, CHOLHDL, LDLDIRECT in the last 72 hours. Anemia Panel: Recent Labs    05/22/21 0422  VITAMINB12 365  FOLATE 17.3  FERRITIN 717*  TIBC 172*  IRON 40  RETICCTPCT 1.3   Urine analysis:    Component Value Date/Time   COLORURINE YELLOW 05/18/2021 2000   APPEARANCEUR CLEAR 05/18/2021 2000   LABSPEC 1.020 05/18/2021 2000   PHURINE 6.0 05/18/2021 2000   GLUCOSEU NEGATIVE 05/18/2021 2000   HGBUR MODERATE (A) 05/18/2021 2000   BILIRUBINUR NEGATIVE 05/18/2021 2000   KETONESUR NEGATIVE 05/18/2021 2000   PROTEINUR NEGATIVE 05/18/2021 2000   UROBILINOGEN 0.2 06/27/2009 0855   NITRITE NEGATIVE 05/18/2021 2000   LEUKOCYTESUR NEGATIVE 05/18/2021 2000   Sepsis Labs: Invalid input(s): PROCALCITONIN, [place]  Microbiology: Recent Results (from the past 240 hour(s))   Resp Panel by RT-PCR (Flu A&B, Covid) Nasopharyngeal Swab     Status: None   Collection Time: 05/18/21  8:21 PM   Specimen: Nasopharyngeal Swab; Nasopharyngeal(NP) swabs in vial transport medium  Result Value Ref Range Status   SARS Coronavirus 2 by RT PCR NEGATIVE NEGATIVE Final    Comment: (  NOTE) SARS-CoV-2 target nucleic acids are NOT DETECTED.  The SARS-CoV-2 RNA is generally detectable in upper respiratory specimens during the acute phase of infection. The lowest concentration of SARS-CoV-2 viral copies this assay can detect is 138 copies/mL. A negative result does not preclude SARS-Cov-2 infection and should not be used as the sole basis for treatment or other patient management decisions. A negative result may occur with  improper specimen collection/handling, submission of specimen other than nasopharyngeal swab, presence of viral mutation(s) within the areas targeted by this assay, and inadequate number of viral copies(<138 copies/mL). A negative result must be combined with clinical observations, patient history, and epidemiological information. The expected result is Negative.  Fact Sheet for Patients:  EntrepreneurPulse.com.au  Fact Sheet for Healthcare Providers:  IncredibleEmployment.be  This test is no t yet approved or cleared by the [place] FDA and  has been authorized for detection and/or diagnosis of SARS-CoV-2 by FDA under an Emergency Use Authorization (EUA). This EUA will remain  in effect (meaning this test can be used) for the duration of the COVID-19 declaration under Section 564(b)(1) of the Act, 21 U.S.C.section 360bbb-3(b)(1), unless the authorization is terminated  or revoked sooner.       Influenza A by PCR NEGATIVE NEGATIVE Final   Influenza B by PCR NEGATIVE NEGATIVE Final    Comment: (NOTE) The Xpert Xpress SARS-CoV-2/FLU/RSV plus assay is intended as an aid in the diagnosis of influenza from  Nasopharyngeal swab specimens and should not be used as a sole basis for treatment. Nasal washings and aspirates are unacceptable for Xpert Xpress SARS-CoV-2/FLU/RSV testing.  Fact Sheet for Patients: EntrepreneurPulse.com.au  Fact Sheet for Healthcare Providers: IncredibleEmployment.be  This test is not yet approved or cleared by the [place] FDA and has been authorized for detection and/or diagnosis of SARS-CoV-2 by FDA under an Emergency Use Authorization (EUA). This EUA will remain in effect (meaning this test can be used) for the duration of the COVID-19 declaration under Section 564(b)(1) of the Act, 21 U.S.C. section 360bbb-3(b)(1), unless the authorization is terminated or revoked.  Performed at [facility], [place] [address] [place], [place] [address]   Gastrointestinal Panel by PCR , Stool     Status: Abnormal   Collection Time: 05/19/21 11:31 AM   Specimen: Stool  Result Value Ref Range Status   Campylobacter species NOT DETECTED NOT DETECTED Final   Plesimonas shigelloides NOT DETECTED NOT DETECTED Final   Salmonella species NOT DETECTED NOT DETECTED Final   Yersinia enterocolitica NOT DETECTED NOT DETECTED Final   Vibrio species NOT DETECTED NOT DETECTED Final   Vibrio cholerae NOT DETECTED NOT DETECTED Final   Enteroaggregative E coli (EAEC) NOT DETECTED NOT DETECTED Final   Enteropathogenic E coli (EPEC) DETECTED (A) NOT DETECTED Final    Comment: Baldwin Jamaica, RN AT 2426 ON 05/20/21 BY JRH   Enterotoxigenic E coli (ETEC) NOT DETECTED NOT DETECTED Final   Shiga like toxin producing E coli (STEC) NOT DETECTED NOT DETECTED Final   Shigella/Enteroinvasive E coli (EIEC) NOT DETECTED NOT DETECTED Final   Cryptosporidium NOT DETECTED NOT DETECTED Final   Cyclospora cayetanensis NOT DETECTED NOT DETECTED Final   Entamoeba histolytica NOT DETECTED NOT DETECTED Final   Giardia lamblia NOT DETECTED NOT DETECTED  Final   Adenovirus F40/41 NOT DETECTED NOT DETECTED Final   Astrovirus NOT DETECTED NOT DETECTED Final   Norovirus GI/GII NOT DETECTED NOT DETECTED Final   Rotavirus A NOT DETECTED NOT DETECTED Final   Sapovirus (I,  II, IV, and V) NOT DETECTED NOT DETECTED Final    Comment: Performed at [facility], [place], [place] [address]  C Difficile Quick Screen w PCR reflex     Status: None   Collection Time: 05/19/21 11:31 AM   Specimen: Stool  Result Value Ref Range Status   C Diff antigen NEGATIVE NEGATIVE Final   C Diff toxin NEGATIVE NEGATIVE Final   C Diff interpretation No C. difficile detected.  Final    Comment: Performed at [facility], [place] [address], [place], [place] [address]    Radiology Studies: No results found.     Josi Roediger T. McDonough  If 7PM-7AM, please contact night-coverage [email or web address] 05/22/2021, 1:36 PM

## 2021-05-22 NOTE — Progress Notes (Signed)
     [facility] Progress Note  CC:  Diarrhea, abdominal pain, weight loss  Subjective:   Feel a little better today.  Diarrhea is less as also confirmed by nurse tech.  Is eating a little bit.    Objective:  Vital signs in last 24 hours: Temp:  [97.4 F (36.3 C)-98 F (36.7 C)] 97.4 F (36.3 C) (07/21 0515) Pulse Rate:  [73-81] 80 (07/21 0515) Resp:  [18] 18 (07/21 0515) BP: (131-165)/(63-72) 165/72 (07/21 0515) SpO2:  [93 %-96 %] 93 % (07/21 0515) Last BM Date: 05/21/21 General:  Alert, in NAD Heart:  Regular rate and rhythm; no murmurs Pulm:  CTAB.  No W/R/R. Abdomen:  Soft, non-distended.  BS present.  Minimal TTP. Extremities:  Without edema. Neurologic:  Alert and oriented x 4;  grossly normal neurologically. Psych:  Alert and cooperative. Normal mood and affect.  Intake/Output from previous day: 07/20 0701 - 07/21 0700 In: 3528.4 [P.O.:540; I.V.:2292.8; IV Piggyback:695.6] Out: 0   Lab Results: Recent Labs    05/20/21 0735 05/21/21 0403 05/22/21 0422  WBC 6.9 6.7 6.3  HGB 12.5 11.9* 11.9*  HCT 36.7 35.6* 35.1*  PLT 238 269 255   BMET Recent Labs    05/20/21 0735 05/21/21 0403 05/22/21 0422  NA 143 143 143  K 3.4* 3.9 3.6  CL 113* 114* 115*  CO2 22 19* 20*  GLUCOSE 102* 97 94  BUN 14 17 12   CREATININE 0.60 0.54 0.48  CALCIUM 9.2 9.0 8.7*   LFT Recent Labs    05/20/21 0735 05/21/21 0403 05/22/21 0422  PROT 5.9*  --   --   ALBUMIN 3.0*   < > 2.8*  AST 40  --   --   ALT 25  --   --   ALKPHOS 129*  --   --   BILITOT 0.8  --   --    < > = values in this interval not displayed.   Assessment / Plan: 1.  Multiple masses of the abdomen and lung: Suspicion of primary lung cancer, biopsy planned for Friday 7/22 after Plavix washout. 2.  Abdominal pain: Likely from liver mass. 3.  Weight loss: With a decrease in appetite/aversion to food, about 20 pounds over the past 2 months; likely from cancer.  They did add Remeron 15 mg at  bedtime to try to help her appetite. 4.  Diarrhea: Continues with some diarrhea although she reports that it is better today.  C. difficile negative.  GI pathogen panel positive for EPEC, azithromycin started 7/19 for 3 days.   **Patient has had some improvement.  She even looks better today, perkier, diarrhea is less, eating a little.  Daughter is at bedside and agrees.  Will hold off with EGD and flex sig for now.    LOS: 4 days   Laban Emperor. Laverna Dossett  05/22/2021, 9:54 AM

## 2021-05-22 NOTE — Progress Notes (Signed)
Mobility Specialist - Progress Note    05/22/21 1358  Mobility  Activity Ambulated in hall;Ambulated to bathroom  Level of Assistance Contact guard assist, steadying assist  Assistive Device Other (Comment) (IV Pole)  Distance Ambulated (ft) 90 ft  Mobility Ambulated with assistance in hallway;Ambulated with assistance in room  Mobility Response Tolerated well  Mobility performed by Mobility specialist  $Mobility charge 1 Mobility   Upon entry pt sat up from bed with no assistance and was eager to walk. Pt ambulated ~90 ft in hallway while pushing IV pole and tolerated session very well. Pt did not experience pain or dizziness and controlled breathing very well. Pt returned to room to use bathroom and then ambulated to bed. Pt left in bed with call bell at side and family in room.   [place] Specialist [facility] Services Phone: [phone] 05/22/21, 2:03 PM

## 2021-05-23 ENCOUNTER — Inpatient Hospital Stay (HOSPITAL_COMMUNITY): Payer: Medicare (Managed Care)

## 2021-05-23 ENCOUNTER — Other Ambulatory Visit: Payer: Self-pay

## 2021-05-23 DIAGNOSIS — K769 Liver disease, unspecified: Secondary | ICD-10-CM | POA: Diagnosis not present

## 2021-05-23 DIAGNOSIS — E0590 Thyrotoxicosis, unspecified without thyrotoxic crisis or storm: Secondary | ICD-10-CM

## 2021-05-23 DIAGNOSIS — I10 Essential (primary) hypertension: Secondary | ICD-10-CM | POA: Diagnosis not present

## 2021-05-23 DIAGNOSIS — I499 Cardiac arrhythmia, unspecified: Secondary | ICD-10-CM

## 2021-05-23 DIAGNOSIS — R1013 Epigastric pain: Secondary | ICD-10-CM | POA: Diagnosis not present

## 2021-05-23 DIAGNOSIS — R918 Other nonspecific abnormal finding of lung field: Secondary | ICD-10-CM | POA: Diagnosis not present

## 2021-05-23 LAB — COMPREHENSIVE METABOLIC PANEL
ALT: 27 U/L (ref 0–44)
AST: 41 U/L (ref 15–41)
Albumin: 2.9 g/dL — ABNORMAL LOW (ref 3.5–5.0)
Alkaline Phosphatase: 121 U/L (ref 38–126)
Anion gap: 7 (ref 5–15)
BUN: 12 mg/dL (ref 8–23)
CO2: 20 mmol/L — ABNORMAL LOW (ref 22–32)
Calcium: 8.4 mg/dL — ABNORMAL LOW (ref 8.9–10.3)
Chloride: 116 mmol/L — ABNORMAL HIGH (ref 98–111)
Creatinine, Ser: 0.56 mg/dL (ref 0.44–1.00)
GFR, Estimated: >60 mL/min (ref >60)
Glucose, Bld: 94 mg/dL (ref 70–99)
Potassium: 3.5 mmol/L (ref 3.5–5.1)
Sodium: 143 mmol/L (ref 135–145)
Total Bilirubin: 0.7 mg/dL (ref 0.3–1.2)
Total Protein: 5.5 g/dL — ABNORMAL LOW (ref 6.5–8.1)

## 2021-05-23 LAB — CBC
HCT: 34.2 % — ABNORMAL LOW (ref 36.0–46.0)
Hemoglobin: 11.5 g/dL — ABNORMAL LOW (ref 12.0–15.0)
MCH: 31.3 pg (ref 26.0–34.0)
MCHC: 33.6 g/dL (ref 30.0–36.0)
MCV: 92.9 fL (ref 80.0–100.0)
Platelets: 257 10*3/uL (ref 150–400)
RBC: 3.68 MIL/uL — ABNORMAL LOW (ref 3.87–5.11)
RDW: 11.8 % (ref 11.5–15.5)
WBC: 6.7 10*3/uL (ref 4.0–10.5)
nRBC: 0 % (ref 0.0–0.2)

## 2021-05-23 LAB — PHOSPHORUS: Phosphorus: 3 mg/dL (ref 2.5–4.6)

## 2021-05-23 LAB — TSH: TSH: 0.014 u[IU]/mL — ABNORMAL LOW (ref 0.350–4.500)

## 2021-05-23 LAB — MAGNESIUM: Magnesium: 1.8 mg/dL (ref 1.7–2.4)

## 2021-05-23 LAB — AMMONIA: Ammonia: 30 umol/L (ref 9–35)

## 2021-05-23 MED ORDER — ENSURE ENLIVE PO LIQD
237.0000 mL | Freq: Two times a day (BID) | ORAL | Status: DC
  Administered 2021-05-24 – 2021-05-27 (×5): 237 mL via ORAL

## 2021-05-23 MED ORDER — MIDAZOLAM HCL 2 MG/2ML IJ SOLN
INTRAMUSCULAR | Status: AC | PRN
  Administered 2021-05-23 (×2): 1 mg via INTRAVENOUS

## 2021-05-23 MED ORDER — FENTANYL CITRATE (PF) 100 MCG/2ML IJ SOLN
INTRAMUSCULAR | Status: AC
  Filled 2021-05-23: qty 2

## 2021-05-23 MED ORDER — LIDOCAINE HCL 1 % IJ SOLN
INTRAMUSCULAR | Status: AC
  Filled 2021-05-23: qty 20

## 2021-05-23 MED ORDER — POTASSIUM CHLORIDE CRYS ER 10 MEQ PO TBCR
40.0000 meq | EXTENDED_RELEASE_TABLET | Freq: Once | ORAL | Status: AC
  Administered 2021-05-23: 40 meq via ORAL
  Filled 2021-05-23: qty 4

## 2021-05-23 MED ORDER — MAGNESIUM SULFATE 2 GM/50ML IV SOLN
2.0000 g | Freq: Once | INTRAVENOUS | Status: AC
  Administered 2021-05-23: 2 g via INTRAVENOUS
  Filled 2021-05-23: qty 50

## 2021-05-23 MED ORDER — GELATIN ABSORBABLE 12-7 MM EX MISC
CUTANEOUS | Status: AC
  Filled 2021-05-23: qty 1

## 2021-05-23 MED ORDER — ENOXAPARIN SODIUM 40 MG/0.4ML IJ SOSY
40.0000 mg | PREFILLED_SYRINGE | INTRAMUSCULAR | Status: DC
  Administered 2021-05-23 – 2021-06-01 (×10): 40 mg via SUBCUTANEOUS
  Filled 2021-05-23 (×10): qty 0.4

## 2021-05-23 MED ORDER — MIRTAZAPINE 15 MG PO TABS
30.0000 mg | ORAL_TABLET | Freq: Every day | ORAL | Status: DC
  Administered 2021-05-23: 30 mg via ORAL
  Filled 2021-05-23: qty 2

## 2021-05-23 MED ORDER — CLOPIDOGREL BISULFATE 75 MG PO TABS
75.0000 mg | ORAL_TABLET | Freq: Every day | ORAL | Status: DC
  Administered 2021-05-24 – 2021-06-01 (×9): 75 mg via ORAL
  Filled 2021-05-23 (×11): qty 1

## 2021-05-23 MED ORDER — MIDAZOLAM HCL 2 MG/2ML IJ SOLN
INTRAMUSCULAR | Status: AC
  Filled 2021-05-23: qty 2

## 2021-05-23 MED ORDER — FENTANYL CITRATE (PF) 100 MCG/2ML IJ SOLN
INTRAMUSCULAR | Status: AC | PRN
  Administered 2021-05-23 (×2): 50 ug via INTRAVENOUS

## 2021-05-23 MED ORDER — ALPRAZOLAM 0.5 MG PO TABS
0.5000 mg | ORAL_TABLET | Freq: Once | ORAL | Status: AC
  Administered 2021-05-23: 0.5 mg via ORAL
  Filled 2021-05-23: qty 1

## 2021-05-23 MED ORDER — PROPRANOLOL HCL 20 MG PO TABS
20.0000 mg | ORAL_TABLET | Freq: Three times a day (TID) | ORAL | Status: DC
  Administered 2021-05-23 – 2021-05-25 (×7): 20 mg via ORAL
  Filled 2021-05-23 (×9): qty 1

## 2021-05-23 NOTE — Progress Notes (Signed)
Pt arrived to room 1514 from [place]. Pt arrived after completing her liver biopsy.

## 2021-05-23 NOTE — Progress Notes (Signed)
PROGRESS NOTE  Karla Chavez [id number] DOB: 15-Apr-1935   PCP: Aletha Halim., PA-C  Patient is from: Home.  DOA: 05/18/2021 LOS: 5  Chief complaints:  Chief Complaint  Patient presents with   Abdominal Pain   Diarrhea   Failure To Thrive     Brief Narrative / Interim history: 86 year old F with PMH of HTN, HLD, vertigo, LLL nodule, osteoarthritis, GERD, appendectomy and aortic aneurysm presenting with intermittent diarrhea, abdominal pain, decreased appetite and about 15 to 20 pound unintentional weight loss in the last 2 months.  She had CT abdomen and pelvis on 7/12 that showed multiple liver lesions, wall thickening in transverse colon and large complex left renal cyst. She had GI appointment for 7/18 but pain acutely gotten worse and prompted her to come to ED.    In ED, slightly tachycardic.  Lipase 93.  CT chest/abdomen/pelvis showed LLL density extending to the pleural surface with interval change in morphology of some GG density in the left lower lobe solid and spiculated mass about 2.7 cm concerning for lung cancer, as well as multiple ill-defined hypodense liver lesions concerning for metastasis and 5.6 cm complex lobulated cyst in the midpole left kidney.  IR consulted for biopsy but recommended waiting for Plavix washout until 7/22.  C. difficile negative.  GIP with EPEC.  Started on azithromycin. Diarrhea seems to have improved.  P.o. intake remains poor. Calorie count underway.    Subjective: Seen and examined earlier this morning.  No major events overnight of this morning.  Reports improvement in her diarrhea.  Appetite remains poor.  She denies nausea or vomiting.  Objective: Vitals:   05/22/21 0515 05/22/21 1314 05/22/21 2134 05/23/21 0605  BP: (!) 165/72 (!) 141/62 (!) 142/78 (!) 149/67  Pulse: 80 74 (!) 50 90  Resp: '18  16 16  '$ Temp: (!) 97.4 F (36.3 C) (!) 97.5 F (36.4 C)    TempSrc: Oral Oral    SpO2: 93% 96% 94% 95%  Weight:      Height:         Intake/Output Summary (Last 24 hours) at 05/23/2021 1225 Last data filed at 05/23/2021 1000 Gross per 24 hour  Intake 2904.58 ml  Output 200 ml  Net 2704.58 ml   Filed Weights   05/19/21 0042  Weight: 62.6 kg    Examination:  GENERAL: No apparent distress.  Nontoxic. HEENT: MMM.  Vision and hearing grossly intact.  NECK: Supple.  No apparent JVD.  RESP:  No IWOB.  Fair aeration bilaterally. CVS: Irregular rhythm.  Normal rate.  Heart sounds normal.  ABD/GI/GU: BS+. Abd soft, NTND.  MSK/EXT:  Moves extremities. No apparent deformity. No edema.  SKIN: no apparent skin lesion or wound NEURO: Awake and alert. Oriented appropriately.  No apparent focal neuro deficit. PSYCH: Calm. Normal affect.   Procedures:  None  Microbiology summarized: [id number] and influenza PCR nonreactive.  Assessment & Plan: LLL nodule/mass/multiple ill-defined hypodense liver lesions-CT shows interval change in morphology of previously noted 2.7 cm GG density in LLL that appears more solid and spiculated concerning for lung cancer, and multiple liver lesions concerning for metastatic disease. -Plan for liver biopsy by IR today. -Ambulatory referral to oncology ordered  Abdominal pain/epigastric tenderness-could be due to gastritis/gastric ulcer, colitis, or liver lesions Infectious diarrhea due to EPEC-started azithromycin 500 mg daily on 7/19.  C. difficile negative. Decreased appetite/oral intake/unintentional weight loss-multifactorial.  Improving. Unintentional weight loss Elevated lipase-improving.  No radiologic evidence of pancreatitis -GI following-EGD and flex  sig on hold -P.o. Protonix 40 mg twice daily -Continue regular diet and supplements -Continue IV fluid -Continue Remeron and multivitamin -Completed 3 days of azithromycin on 7/21 -Calorie count underway.   -Appreciate help by dietitian  Large complex complex lobulated left renal cyst: Noted on CT. measures 5.6 x 4 x 4 cm.  Or  3.1 cm on [place] in 2007 and 2011. -Need further evaluation with abdominal MRI once clinically stable unable to hold breath -Discussed this with patient and patient's son at bedside.  Primary hypothyroidism: TSH low at 0.014.  Free T4 elevated to 2.54. -Change metoprolol to Inderal -Needs outpatient follow-up with endocrinology  Irregular rhythm: No history of A. fib.  Appears to have irregular rhythm on exam. -Twelve-lead EKG and telemetry monitoring  Essential hypertension: Normotensive. -Continue home amlodipine and Avapro -Changed metoprolol to Inderal in the setting of hyperthyroidism   Hyperlipidemia:  -Continue home statin.   Anxiety and depression: Stable. -Continue home medications -Added Remeron mainly for appetite.  Shortness of breath: Normal saturation on room air.  Lung exam reassuring.  Could be due to #1.  Resolved. -Encourage incentive spirometry  Ascending aortic aneurysm: 3.8 cm  on CT angio chest in 2017. -Needs on annual imaging on this  Hypokalemia/hypomagnesemia: K3.6.  Mg 1.8. -K-Dur 40 mill equivalent x1 -IV magnesium sulfate 2 g x 1  Metabolic acidosis: Likely from IV fluid and diarrhea.  Stable. -Management as above   Unintentional weight loss Body mass index is 21.62 kg/m. Nutrition Problem: Inadequate oral intake Etiology: poor appetite, diarrhea Signs/Symptoms: per patient/family report Interventions: MVI, Prostat, Snacks, Magic cup   DVT prophylaxis:  enoxaparin (LOVENOX) injection 40 mg Start: 05/19/21 0600  Code Status: Full code Family Communication: Patient and/or RN.  None at bedside today. Level of care: Telemetry Status is: Inpatient  Remains inpatient appropriate because:Persistent severe electrolyte disturbances, Ongoing diagnostic testing needed not appropriate for outpatient work up, IV treatments appropriate due to intensity of illness or inability to take PO, and Inpatient level of care appropriate due to severity of  illness  Dispo: The patient is from: Home              Anticipated d/c is to: Home              Patient currently is not medically stable to d/c.   Difficult to place patient No       Consultants:  Interventional radiology Gastroenterology   Sch Meds:  Scheduled Meds:  fentaNYL       gelatin adsorbable       lidocaine       midazolam       (feeding supplement) PROSource Plus  30 mL Oral TID BM   amLODipine  5 mg Oral Daily   atorvastatin  80 mg Oral Daily   citalopram  10 mg Oral Daily   enoxaparin (LOVENOX) injection  40 mg Subcutaneous Q24H   irbesartan  150 mg Oral Daily   mirtazapine  15 mg Oral QHS   multivitamin with minerals  1 tablet Oral Daily   pantoprazole  40 mg Oral BID   propranolol  20 mg Oral TID   protein supplement  1 Scoop Oral TID WC   Continuous Infusions:  0.45 % NaCl with KCl 20 mEq / L 100 mL/hr at 05/23/21 0550    PRN Meds:.acetaminophen **OR** acetaminophen, ALPRAZolam, alum & mag hydroxide-simeth, loperamide, ondansetron **OR** ondansetron (ZOFRAN) IV  Antimicrobials: Anti-infectives (From admission, onward)    Start  Dose/Rate Route Frequency Ordered Stop   05/20/21 1700  azithromycin (ZITHROMAX) 500 mg in sodium chloride 0.9 % 250 mL IVPB        500 mg 250 mL/hr over 60 Minutes Intravenous Daily 05/20/21 1607 05/22/21 1045        I have personally reviewed the following labs and images: CBC: Recent Labs  Lab 05/19/21 0351 05/20/21 0735 05/21/21 0403 05/22/21 0422 05/23/21 0402  WBC 5.9 6.9 6.7 6.3 6.7  HGB 12.1 12.5 11.9* 11.9* 11.5*  HCT 35.5* 36.7 35.6* 35.1* 34.2*  MCV 92.7 92.7 93.0 92.1 92.9  PLT 229 238 269 255 257   BMP &GFR Recent Labs  Lab 05/19/21 0351 05/20/21 0735 05/21/21 0403 05/22/21 0422 05/23/21 0402  NA 142 143 143 143 143  K 3.3* 3.4* 3.9 3.6 3.5  CL 108 113* 114* 115* 116*  CO2 24 22 19* 20* 20*  GLUCOSE 145* 102* 97 94 94  BUN '15 14 17 12 12  '$ CREATININE 0.63 0.60 0.54 0.48 0.56   CALCIUM 8.8* 9.2 9.0 8.7* 8.4*  MG  --  1.5* 1.8 1.6* 1.8  PHOS  --  3.6 3.2 2.9 3.0   Estimated Creatinine Clearance: 50.9 mL/min (by C-G formula based on SCr of 0.56 mg/dL). Liver & Pancreas: Recent Labs  Lab 05/18/21 2021 05/19/21 0351 05/20/21 0735 05/21/21 0403 05/22/21 0422 05/23/21 0402  AST 45* 40 40  --   --  41  ALT '23 21 25  '$ --   --  27  ALKPHOS 134* 123 129*  --   --  121  BILITOT 0.7 0.5 0.8  --   --  0.7  PROT 6.3* 5.7* 5.9*  --   --  5.5*  ALBUMIN 3.3* 3.1* 3.0* 3.0* 2.8* 2.9*   Recent Labs  Lab 05/18/21 2021 05/19/21 0706 05/20/21 0735 05/21/21 0403  LIPASE 93* 91* 66* 70*   Recent Labs  Lab 05/23/21 0402  AMMONIA 30   Diabetic: No results for input(s): HGBA1C in the last 72 hours. No results for input(s): GLUCAP in the last 168 hours. Cardiac Enzymes: No results for input(s): CKTOTAL, CKMB, CKMBINDEX, TROPONINI in the last 168 hours. No results for input(s): PROBNP in the last 8760 hours. Coagulation Profile: No results for input(s): INR, PROTIME in the last 168 hours. Thyroid Function Tests: Recent Labs    05/22/21 1432 05/23/21 0402  TSH  --  0.014*  FREET4 2.54*  --    Lipid Profile: No results for input(s): CHOL, HDL, LDLCALC, TRIG, CHOLHDL, LDLDIRECT in the last 72 hours. Anemia Panel: Recent Labs    05/22/21 0422  VITAMINB12 365  FOLATE 17.3  FERRITIN 717*  TIBC 172*  IRON 40  RETICCTPCT 1.3   Urine analysis:    Component Value Date/Time   COLORURINE YELLOW 05/18/2021 2000   APPEARANCEUR CLEAR 05/18/2021 2000   LABSPEC 1.020 05/18/2021 2000   PHURINE 6.0 05/18/2021 2000   GLUCOSEU NEGATIVE 05/18/2021 2000   HGBUR MODERATE (A) 05/18/2021 2000   BILIRUBINUR NEGATIVE 05/18/2021 2000   KETONESUR NEGATIVE 05/18/2021 2000   PROTEINUR NEGATIVE 05/18/2021 2000   UROBILINOGEN 0.2 06/27/2009 0855   NITRITE NEGATIVE 05/18/2021 2000   LEUKOCYTESUR NEGATIVE 05/18/2021 2000   Sepsis Labs: Invalid input(s): PROCALCITONIN,  [place]  Microbiology: Recent Results (from the past 240 hour(s))  Resp Panel by RT-PCR (Flu A&B, Covid) Nasopharyngeal Swab     Status: None   Collection Time: 05/18/21  8:21 PM   Specimen: Nasopharyngeal Swab; Nasopharyngeal(NP) swabs in vial  transport medium  Result Value Ref Range Status   SARS Coronavirus 2 by RT PCR NEGATIVE NEGATIVE Final    Comment: (NOTE) SARS-CoV-2 target nucleic acids are NOT DETECTED.  The SARS-CoV-2 RNA is generally detectable in upper respiratory specimens during the acute phase of infection. The lowest concentration of SARS-CoV-2 viral copies this assay can detect is 138 copies/mL. A negative result does not preclude SARS-Cov-2 infection and should not be used as the sole basis for treatment or other patient management decisions. A negative result may occur with  improper specimen collection/handling, submission of specimen other than nasopharyngeal swab, presence of viral mutation(s) within the areas targeted by this assay, and inadequate number of viral copies(<138 copies/mL). A negative result must be combined with clinical observations, patient history, and epidemiological information. The expected result is Negative.  Fact Sheet for Patients:  EntrepreneurPulse.com.au  Fact Sheet for Healthcare Providers:  IncredibleEmployment.be  This test is no t yet approved or cleared by the [place] FDA and  has been authorized for detection and/or diagnosis of SARS-CoV-2 by FDA under an Emergency Use Authorization (EUA). This EUA will remain  in effect (meaning this test can be used) for the duration of the COVID-19 declaration under Section 564(b)(1) of the Act, 21 U.S.C.section 360bbb-3(b)(1), unless the authorization is terminated  or revoked sooner.       Influenza A by PCR NEGATIVE NEGATIVE Final   Influenza B by PCR NEGATIVE NEGATIVE Final    Comment: (NOTE) The Xpert Xpress SARS-CoV-2/FLU/RSV plus  assay is intended as an aid in the diagnosis of influenza from Nasopharyngeal swab specimens and should not be used as a sole basis for treatment. Nasal washings and aspirates are unacceptable for Xpert Xpress SARS-CoV-2/FLU/RSV testing.  Fact Sheet for Patients: EntrepreneurPulse.com.au  Fact Sheet for Healthcare Providers: IncredibleEmployment.be  This test is not yet approved or cleared by the [place] FDA and has been authorized for detection and/or diagnosis of SARS-CoV-2 by FDA under an Emergency Use Authorization (EUA). This EUA will remain in effect (meaning this test can be used) for the duration of the COVID-19 declaration under Section 564(b)(1) of the Act, 21 U.S.C. section 360bbb-3(b)(1), unless the authorization is terminated or revoked.  Performed at [facility], [place] [address], [place], [place] [address]   Gastrointestinal Panel by PCR , Stool     Status: Abnormal   Collection Time: 05/19/21 11:31 AM   Specimen: Stool  Result Value Ref Range Status   Campylobacter species NOT DETECTED NOT DETECTED Final   Plesimonas shigelloides NOT DETECTED NOT DETECTED Final   Salmonella species NOT DETECTED NOT DETECTED Final   Yersinia enterocolitica NOT DETECTED NOT DETECTED Final   Vibrio species NOT DETECTED NOT DETECTED Final   Vibrio cholerae NOT DETECTED NOT DETECTED Final   Enteroaggregative E coli (EAEC) NOT DETECTED NOT DETECTED Final   Enteropathogenic E coli (EPEC) DETECTED (A) NOT DETECTED Final    Comment: Baldwin [place], RN AT [id number] ON 05/20/21 BY JRH   Enterotoxigenic E coli (ETEC) NOT DETECTED NOT DETECTED Final   Shiga like toxin producing E coli (STEC) NOT DETECTED NOT DETECTED Final   Shigella/Enteroinvasive E coli (EIEC) NOT DETECTED NOT DETECTED Final   Cryptosporidium NOT DETECTED NOT DETECTED Final   Cyclospora cayetanensis NOT DETECTED NOT DETECTED Final   Entamoeba histolytica NOT DETECTED NOT  DETECTED Final   Giardia lamblia NOT DETECTED NOT DETECTED Final   Adenovirus F40/41 NOT DETECTED NOT DETECTED Final   Astrovirus NOT DETECTED NOT DETECTED  Final   Norovirus GI/GII NOT DETECTED NOT DETECTED Final   Rotavirus A NOT DETECTED NOT DETECTED Final   Sapovirus (I, II, IV, and V) NOT DETECTED NOT DETECTED Final    Comment: Performed at [facility], [place], [place] [address]  C Difficile Quick Screen w PCR reflex     Status: None   Collection Time: 05/19/21 11:31 AM   Specimen: Stool  Result Value Ref Range Status   C Diff antigen NEGATIVE NEGATIVE Final   C Diff toxin NEGATIVE NEGATIVE Final   C Diff interpretation No C. difficile detected.  Final    Comment: Performed at [facility], [place] [address] [place], [place] [address]    Radiology Studies: No results found.     Jacalyn Biggs T. [place]ar  If 7PM-7AM, please contact night-coverage [email or web address] 05/23/2021, 12:25 PM

## 2021-05-23 NOTE — Progress Notes (Signed)
Calorie Count Note   48 hour calorie count ordered. Day 2 below.  Diet: regular Supplements:  -Prosource Plus PO BID, each provides 100 kcals and 15g protein -Beneprotein TID each provides 25 kcals and 6g protein  7/21: Breakfast: 25% of muffin ~35 kcals, 0g protein Lunch: 0 Dinner: 25% ~55 kcals, 3g protein Supplements: 250 kcals, 42g protein (2 Beneprotein, 2 Prosource)  Total intake: 340 kcal (21% of minimum estimated needs)  45g protein (64% of minimum estimated needs)  Nutrition Dx: Inadequate oral intake related to poor appetite, diarrhea as evidenced by per patient/family report  Goal: Pt to meet >/= 90% of their estimated nutrition needs   Intervention:  -Would trial Ensure supplements once diet advanced -Continue Prosource Plus TID -Continue Beneprotein TID -Continue daily MVI -Continue daily snacks  -If PO unable to improve, may need to consider feeding tube placement (if within [place])   Clayton Bibles, MS, RD, LDN Inpatient Clinical Dietitian Contact information available via Amion

## 2021-05-23 NOTE — Progress Notes (Addendum)
MEDICATION-RELATED CONSULT NOTE   IR Procedure Consult - Anticoagulant/Antiplatelet PTA/Inpatient Med List Review by Pharmacist   Procedure: US guided core biopsy of liver lesion    Completed: 05/23/21 ~1300  Post-Procedural bleeding risk per IR MD assessment:  low  Antithrombotic medications on inpatient or PTA profile prior to procedure:  -Enoxaparin '40mg'$  [place] q24 hours for DVT ppx -Clopidogrel '75mg'$  PO QD - held since 7/17   Recommended restart time per IR Post-Procedure Guidelines:   Per IR note on 7/20: -Enoxaparin can be restarted 6 hours after liver biopsy -Clopidogrel can be restarted right after the procedure  Plan: discussed w/ Dr. Cyndia Skeeters Restart enoxaparin '40mg'$  [place] q24 hours 7/22 @ 2000 Restart clopidogrel '75mg'$  PO QD 7/23 AM  Dimple Nanas, PharmD 05/23/2021 1:48 PM

## 2021-05-23 NOTE — Procedures (Signed)
Interventional Radiology Procedure Note  Procedure: US guided core biopsy of liver lesion  Complications: None  Estimated Blood Loss: None  Recommendations: - Bedrest x 2 hrs - Path sent    Signed,  Teryn Boerema K. Sharaya Boruff, MD    

## 2021-05-24 DIAGNOSIS — K769 Liver disease, unspecified: Secondary | ICD-10-CM | POA: Diagnosis not present

## 2021-05-24 DIAGNOSIS — R1901 Right upper quadrant abdominal swelling, mass and lump: Secondary | ICD-10-CM

## 2021-05-24 DIAGNOSIS — I491 Atrial premature depolarization: Secondary | ICD-10-CM

## 2021-05-24 DIAGNOSIS — F419 Anxiety disorder, unspecified: Secondary | ICD-10-CM

## 2021-05-24 DIAGNOSIS — R918 Other nonspecific abnormal finding of lung field: Secondary | ICD-10-CM | POA: Diagnosis not present

## 2021-05-24 DIAGNOSIS — F32A Depression, unspecified: Secondary | ICD-10-CM

## 2021-05-24 DIAGNOSIS — I10 Essential (primary) hypertension: Secondary | ICD-10-CM | POA: Diagnosis not present

## 2021-05-24 LAB — RENAL FUNCTION PANEL
Albumin: 3 g/dL — ABNORMAL LOW (ref 3.5–5.0)
Anion gap: 9 (ref 5–15)
BUN: 11 mg/dL (ref 8–23)
CO2: 20 mmol/L — ABNORMAL LOW (ref 22–32)
Calcium: 8.9 mg/dL (ref 8.9–10.3)
Chloride: 112 mmol/L — ABNORMAL HIGH (ref 98–111)
Creatinine, Ser: 0.51 mg/dL (ref 0.44–1.00)
GFR, Estimated: >60 mL/min (ref >60)
Glucose, Bld: 80 mg/dL (ref 70–99)
Phosphorus: 3.5 mg/dL (ref 2.5–4.6)
Potassium: 3.8 mmol/L (ref 3.5–5.1)
Sodium: 141 mmol/L (ref 135–145)

## 2021-05-24 LAB — MAGNESIUM: Magnesium: 2 mg/dL (ref 1.7–2.4)

## 2021-05-24 LAB — HEMOGLOBIN AND HEMATOCRIT, BLOOD
HCT: 35.3 % — ABNORMAL LOW (ref 36.0–46.0)
Hemoglobin: 12 g/dL (ref 12.0–15.0)

## 2021-05-24 MED ORDER — DRONABINOL 2.5 MG PO CAPS
2.5000 mg | ORAL_CAPSULE | Freq: Two times a day (BID) | ORAL | Status: DC
  Administered 2021-05-24 – 2021-05-31 (×16): 2.5 mg via ORAL
  Filled 2021-05-24 (×16): qty 1

## 2021-05-24 MED ORDER — ATORVASTATIN CALCIUM 40 MG PO TABS
40.0000 mg | ORAL_TABLET | Freq: Every day | ORAL | Status: DC
  Administered 2021-05-24 – 2021-06-01 (×9): 40 mg via ORAL
  Filled 2021-05-24 (×10): qty 1

## 2021-05-24 NOTE — Progress Notes (Signed)
PROGRESS NOTE  Karla Chavez [id number] DOB: 08-03-1935   PCP: Aletha Halim., PA-C  Patient is from: Home.  DOA: 05/18/2021 LOS: 6  Chief complaints:  Chief Complaint  Patient presents with   Abdominal Pain   Diarrhea   Failure To Thrive     Brief Narrative / Interim history: 86 year old F with PMH of HTN, HLD, vertigo, LLL nodule, osteoarthritis, GERD, appendectomy and aortic aneurysm presenting with intermittent diarrhea, abdominal pain, decreased appetite and about 15 to 20 pound unintentional weight loss in the last 2 months.  She had CT abdomen and pelvis on 7/12 that showed multiple liver lesions, wall thickening in transverse colon and large complex left renal cyst. She had GI appointment for 7/18 but pain acutely gotten worse and prompted her to come to ED.    In ED, slightly tachycardic.  Lipase 93.  CT chest/abdomen/pelvis showed LLL density extending to the pleural surface with interval change in morphology of some GG density in the left lower lobe solid and spiculated mass about 2.7 cm concerning for lung cancer, as well as multiple ill-defined hypodense liver lesions concerning for metastasis and 5.6 cm complex lobulated cyst in the midpole left kidney.  C. difficile negative.  GIP with EPEC.  Started on azithromycin. Diarrhea seems to have improved.  Patient underwent liver biopsy on 05/23/2021.  Pathology pending.  P.o. intake remains poor. Calorie count underway.  She may need feeding tube if no improvement.    Subjective: Seen and examined earlier this morning.  No major events overnight of this morning.  Diarrhea improved.  Denies nausea, vomiting or abdominal pain.  Appetite remains poor.  She is tired of being in the hospital.  We have discussed about feeding tube if appetite remains poor.  She is anxious about this.   Objective: Vitals:   05/23/21 2104 05/24/21 0034 05/24/21 0407 05/24/21 1025  BP: (!) 161/87 (!) 147/78 (!) 149/75 (!) 158/76  Pulse:  81 75 79 79  Resp: (!) '22 20 20   '$ Temp: 97.8 F (36.6 C) 98.7 F (37.1 C) 97.6 F (36.4 C)   TempSrc: Oral Oral Oral   SpO2: 97% 95% 92%   Weight:      Height:        Intake/Output Summary (Last 24 hours) at 05/24/2021 1223 Last data filed at 05/24/2021 0900 Gross per 24 hour  Intake 240 ml  Output --  Net 240 ml   Filed Weights   05/19/21 0042  Weight: 62.6 kg    Examination:  GENERAL: No apparent distress.  Nontoxic. HEENT: MMM.  Vision and hearing grossly intact.  NECK: Supple.  No apparent JVD.  RESP: On RA.  No IWOB.  Fair aeration bilaterally. CVS:  RRR. Heart sounds normal.  ABD/GI/GU: BS+. Abd soft, NTND.  MSK/EXT:  Moves extremities. No apparent deformity. No edema.  SKIN: no apparent skin lesion or wound NEURO: Awake and alert. Oriented appropriately.  No apparent focal neuro deficit. PSYCH: Calm. Normal affect.   Procedures:  7/22-liver biopsy by IR on 05/23/2021.  Microbiology summarized: COVID-19 and influenza PCR nonreactive.  Assessment & Plan: LLL nodule/mass/multiple ill-defined hypodense liver lesions-CT shows interval change in morphology of previously noted 2.7 cm GG density in LLL that appears more solid and spiculated concerning for lung cancer, and multiple liver lesions concerning for metastatic disease. -S/p liver biopsy on 05/23/2021.  Follow-up pathology. -Ambulatory referral to oncology ordered  Abdominal pain/epigastric tenderness-Resolved. Infectious diarrhea due to EPEC-completed 3 days of azithromycin 500 mg  on 7/21.  C. difficile negative. Decreased appetite/oral intake/unintentional weight loss-multifactorial.  Appetite remains poor. Elevated lipase-improving.  No radiologic evidence of pancreatitis -GI following-EGD and flex sig on hold -P.o. Protonix 40 mg twice daily -Continue regular diet and supplements -Increased Remeron to 30 mg daily on 7/22 -Continue multivitamins and supplements -Appreciate help by dietitian -May need  feeding tube if no improvement in appetite.  Large complex complex lobulated left renal cyst: Noted on CT. measures 5.6 x 4 x 4 cm.  Was 3.1 cm on [place] in 2007 and 2011. -Need further evaluation with abdominal MRI once clinically stable unable to hold breath -Discussed this with patient and patient's son at bedside.  Primary hyperthyroidism: TSH low at 0.014.  Free T4 elevated to 2.54. -Changed metoprolol to Inderal  -Needs outpatient follow-up with endocrinology -Hold off methimazole in case she needs RAI study outpatient.  Premature atrial contraction: Stable. -Inderal as above.  Essential hypertension: Normotensive. -Continue home amlodipine and Avapro -Continue Inderal as above   Hyperlipidemia:  -Continue home statin.   Anxiety and depression: Stable. -Continue home medications -Added Remeron mainly for appetite.  Shortness of breath: Normal saturation on room air.  Lung exam reassuring.  Could be due to #1.  Resolved. -Encourage incentive spirometry  Ascending aortic aneurysm: 3.8 cm  on CT angio chest in 2017. -Needs on annual imaging on this  Hypokalemia/hypomagnesemia: Resolved.  Metabolic acidosis: Likely from IV fluid and diarrhea.  Stable. -Management as above   Unintentional weight loss Body mass index is 21.62 kg/m. Nutrition Problem: Inadequate oral intake Etiology: poor appetite, diarrhea Signs/Symptoms: per patient/family report Interventions: MVI, Prostat, Snacks, Magic cup   DVT prophylaxis:  enoxaparin (LOVENOX) injection 40 mg Start: 05/23/21 2000  Code Status: Full code Family Communication: Patient and/or Therapist, sports.  None at bedside today. Level of care: Telemetry Status is: Inpatient  Remains inpatient appropriate because:Inpatient level of care appropriate due to severity of illness and inadequate caloric intake  Dispo: The patient is from: Home              Anticipated d/c is to: Home              Patient currently is not medically stable to  d/c.   Difficult to place patient No       Consultants:  Interventional radiology-signed off Gastroenterology-signed off   Sch Meds:  Scheduled Meds:  (feeding supplement) PROSource Plus  30 mL Oral TID BM   amLODipine  5 mg Oral Daily   atorvastatin  40 mg Oral Daily   citalopram  10 mg Oral Daily   clopidogrel  75 mg Oral Daily   enoxaparin (LOVENOX) injection  40 mg Subcutaneous Q24H   feeding supplement  237 mL Oral BID BM   irbesartan  150 mg Oral Daily   mirtazapine  30 mg Oral QHS   multivitamin with minerals  1 tablet Oral Daily   pantoprazole  40 mg Oral BID   propranolol  20 mg Oral TID   protein supplement  1 Scoop Oral TID WC   Continuous Infusions:    PRN Meds:.acetaminophen **OR** acetaminophen, alum & mag hydroxide-simeth, loperamide, ondansetron **OR** ondansetron (ZOFRAN) IV  Antimicrobials: Anti-infectives (From admission, onward)    Start     Dose/Rate Route Frequency Ordered Stop   05/20/21 1700  azithromycin (ZITHROMAX) 500 mg in sodium chloride 0.9 % 250 mL IVPB        500 mg 250 mL/hr over 60 Minutes Intravenous Daily 05/20/21 1607 05/22/21  1045        I have personally reviewed the following labs and images: CBC: Recent Labs  Lab 05/19/21 0351 05/20/21 0735 05/21/21 0403 05/22/21 0422 05/23/21 0402 05/24/21 0457  WBC 5.9 6.9 6.7 6.3 6.7  --   HGB 12.1 12.5 11.9* 11.9* 11.5* 12.0  HCT 35.5* 36.7 35.6* 35.1* 34.2* 35.3*  MCV 92.7 92.7 93.0 92.1 92.9  --   PLT 229 238 269 255 257  --    BMP &GFR Recent Labs  Lab 05/20/21 0735 05/21/21 0403 05/22/21 0422 05/23/21 0402 05/24/21 0457  NA 143 143 143 143 141  K 3.4* 3.9 3.6 3.5 3.8  CL 113* 114* 115* 116* 112*  CO2 22 19* 20* 20* 20*  GLUCOSE 102* 97 94 94 80  BUN '14 17 12 12 11  '$ CREATININE 0.60 0.54 0.48 0.56 0.51  CALCIUM 9.2 9.0 8.7* 8.4* 8.9  MG 1.5* 1.8 1.6* 1.8 2.0  PHOS 3.6 3.2 2.9 3.0 3.5   Estimated Creatinine Clearance: 50.9 mL/min (by C-G formula based on  SCr of 0.51 mg/dL). Liver & Pancreas: Recent Labs  Lab 05/18/21 2021 05/19/21 0351 05/20/21 0735 05/21/21 0403 05/22/21 0422 05/23/21 0402 05/24/21 0457  AST 45* 40 40  --   --  41  --   ALT '23 21 25  '$ --   --  27  --   ALKPHOS 134* 123 129*  --   --  121  --   BILITOT 0.7 0.5 0.8  --   --  0.7  --   PROT 6.3* 5.7* 5.9*  --   --  5.5*  --   ALBUMIN 3.3* 3.1* 3.0* 3.0* 2.8* 2.9* 3.0*   Recent Labs  Lab 05/18/21 2021 05/19/21 0706 05/20/21 0735 05/21/21 0403  LIPASE 93* 91* 66* 70*   Recent Labs  Lab 05/23/21 0402  AMMONIA 30   Diabetic: No results for input(s): HGBA1C in the last 72 hours. No results for input(s): GLUCAP in the last 168 hours. Cardiac Enzymes: No results for input(s): CKTOTAL, CKMB, CKMBINDEX, TROPONINI in the last 168 hours. No results for input(s): PROBNP in the last 8760 hours. Coagulation Profile: No results for input(s): INR, PROTIME in the last 168 hours. Thyroid Function Tests: Recent Labs    05/22/21 1432 05/23/21 0402  TSH  --  0.014*  FREET4 2.54*  --    Lipid Profile: No results for input(s): CHOL, HDL, LDLCALC, TRIG, CHOLHDL, LDLDIRECT in the last 72 hours. Anemia Panel: Recent Labs    05/22/21 0422  VITAMINB12 365  FOLATE 17.3  FERRITIN 717*  TIBC 172*  IRON 40  RETICCTPCT 1.3   Urine analysis:    Component Value Date/Time   COLORURINE YELLOW 05/18/2021 2000   APPEARANCEUR CLEAR 05/18/2021 2000   LABSPEC 1.020 05/18/2021 2000   PHURINE 6.0 05/18/2021 2000   GLUCOSEU NEGATIVE 05/18/2021 2000   HGBUR MODERATE (A) 05/18/2021 2000   BILIRUBINUR NEGATIVE 05/18/2021 2000   KETONESUR NEGATIVE 05/18/2021 2000   PROTEINUR NEGATIVE 05/18/2021 2000   UROBILINOGEN 0.2 06/27/2009 0855   NITRITE NEGATIVE 05/18/2021 2000   LEUKOCYTESUR NEGATIVE 05/18/2021 2000   Sepsis Labs: Invalid input(s): PROCALCITONIN, Angelina  Microbiology: Recent Results (from the past 240 hour(s))  Resp Panel by RT-PCR (Flu A&B, Covid)  Nasopharyngeal Swab     Status: None   Collection Time: 05/18/21  8:21 PM   Specimen: Nasopharyngeal Swab; Nasopharyngeal(NP) swabs in vial transport medium  Result Value Ref Range Status   SARS Coronavirus 2 by  RT PCR NEGATIVE NEGATIVE Final    Comment: (NOTE) SARS-CoV-2 target nucleic acids are NOT DETECTED.  The SARS-CoV-2 RNA is generally detectable in upper respiratory specimens during the acute phase of infection. The lowest concentration of SARS-CoV-2 viral copies this assay can detect is 138 copies/mL. A negative result does not preclude SARS-Cov-2 infection and should not be used as the sole basis for treatment or other patient management decisions. A negative result may occur with  improper specimen collection/handling, submission of specimen other than nasopharyngeal swab, presence of viral mutation(s) within the areas targeted by this assay, and inadequate number of viral copies(<138 copies/mL). A negative result must be combined with clinical observations, patient history, and epidemiological information. The expected result is Negative.  Fact Sheet for Patients:  EntrepreneurPulse.com.au  Fact Sheet for Healthcare Providers:  IncredibleEmployment.be  This test is no t yet approved or cleared by the [place] FDA and  has been authorized for detection and/or diagnosis of SARS-CoV-2 by FDA under an Emergency Use Authorization (EUA). This EUA will remain  in effect (meaning this test can be used) for the duration of the COVID-19 declaration under Section 564(b)(1) of the Act, 21 U.S.C.section 360bbb-3(b)(1), unless the authorization is terminated  or revoked sooner.       Influenza A by PCR NEGATIVE NEGATIVE Final   Influenza B by PCR NEGATIVE NEGATIVE Final    Comment: (NOTE) The Xpert Xpress SARS-CoV-2/FLU/RSV plus assay is intended as an aid in the diagnosis of influenza from Nasopharyngeal swab specimens and should not be  used as a sole basis for treatment. Nasal washings and aspirates are unacceptable for Xpert Xpress SARS-CoV-2/FLU/RSV testing.  Fact Sheet for Patients: EntrepreneurPulse.com.au  Fact Sheet for Healthcare Providers: IncredibleEmployment.be  This test is not yet approved or cleared by the [place] FDA and has been authorized for detection and/or diagnosis of SARS-CoV-2 by FDA under an Emergency Use Authorization (EUA). This EUA will remain in effect (meaning this test can be used) for the duration of the COVID-19 declaration under Section 564(b)(1) of the Act, 21 U.S.C. section 360bbb-3(b)(1), unless the authorization is terminated or revoked.  Performed at [facility], [place] [address] [place], [place] [address]   Gastrointestinal Panel by PCR , Stool     Status: Abnormal   Collection Time: 05/19/21 11:31 AM   Specimen: Stool  Result Value Ref Range Status   Campylobacter species NOT DETECTED NOT DETECTED Final   Plesimonas shigelloides NOT DETECTED NOT DETECTED Final   Salmonella species NOT DETECTED NOT DETECTED Final   Yersinia enterocolitica NOT DETECTED NOT DETECTED Final   Vibrio species NOT DETECTED NOT DETECTED Final   Vibrio cholerae NOT DETECTED NOT DETECTED Final   Enteroaggregative E coli (EAEC) NOT DETECTED NOT DETECTED Final   Enteropathogenic E coli (EPEC) DETECTED (A) NOT DETECTED Final    Comment: Baldwin [place], RN AT [id number] ON 05/20/21 BY JRH   Enterotoxigenic E coli (ETEC) NOT DETECTED NOT DETECTED Final   Shiga like toxin producing E coli (STEC) NOT DETECTED NOT DETECTED Final   Shigella/Enteroinvasive E coli (EIEC) NOT DETECTED NOT DETECTED Final   Cryptosporidium NOT DETECTED NOT DETECTED Final   Cyclospora cayetanensis NOT DETECTED NOT DETECTED Final   Entamoeba histolytica NOT DETECTED NOT DETECTED Final   Giardia lamblia NOT DETECTED NOT DETECTED Final   Adenovirus F40/41 NOT DETECTED NOT DETECTED  Final   Astrovirus NOT DETECTED NOT DETECTED Final   Norovirus GI/GII NOT DETECTED NOT DETECTED Final   Rotavirus A  NOT DETECTED NOT DETECTED Final   Sapovirus (I, II, IV, and V) NOT DETECTED NOT DETECTED Final    Comment: Performed at [facility], [place], [place] [address]  C Difficile Quick Screen w PCR reflex     Status: None   Collection Time: 05/19/21 11:31 AM   Specimen: Stool  Result Value Ref Range Status   C Diff antigen NEGATIVE NEGATIVE Final   C Diff toxin NEGATIVE NEGATIVE Final   C Diff interpretation No C. difficile detected.  Final    Comment: Performed at [facility], [place] [address], [place], [place] [address]    Radiology Studies: US BIOPSY (LIVER)  Result Date: 05/23/2021 INDICATION: 86 year old female with left lower lobe pulmonary nodule and multifocal hepatic lesions concerning for primary bronchogenic carcinoma with hepatic metastatic disease. She presents for ultrasound-guided core biopsy of the same. EXAM: ULTRASOUND BIOPSY CORE LIVER MEDICATIONS: None. ANESTHESIA/SEDATION: Moderate (conscious) sedation was employed during this procedure. A total of Versed 2 mg and Fentanyl 100 mcg was administered intravenously. Moderate Sedation Time: 12 minutes. The patient's level of consciousness and vital signs were monitored continuously by radiology nursing throughout the procedure under my direct supervision. FLUOROSCOPY TIME:  None. COMPLICATIONS: None immediate. PROCEDURE: Informed written consent was obtained from the patient after a thorough discussion of the procedural risks, benefits and alternatives. All questions were addressed. Maximal Sterile Barrier Technique was utilized including caps, mask, sterile gowns, sterile gloves, sterile drape, hand hygiene and skin antiseptic. A timeout was performed prior to the initiation of the procedure. The right upper quadrant was interrogated with ultrasound. Multifocal hypoechoic solid  liver lesions are identified. A suitable skin entry site was selected and marked. The region was sterilely prepped and draped in the standard fashion using chlorhexidine skin prep. Local anesthesia was attained by infiltration with 1% lidocaine. A small dermatotomy was made. Under real-time sonographic guidance, a 17 gauge introducer needle was advanced into the margin of the mass. Multiple 18 gauge biopsies were then coaxially obtained using the BioPince automated biopsy device. Biopsy samples were placed in formalin and delivered to pathology for further analysis. As a 64 gauge introducer needle was removed, the biopsy tract was embolized with a Gel-Foam slurry. Post biopsy ultrasound imaging demonstrates no evidence of active hemorrhage or perihepatic hematoma. The patient tolerated the procedure well. IMPRESSION: Technically successful ultrasound-guided core biopsy of hepatic lesion. Electronically Signed   By: Jacqulynn Cadet M.D.   On: 05/23/2021 13:44       Keifer Habib T. [place]  If 7PM-7AM, please contact night-coverage [email or web address] 05/24/2021, 12:23 PM

## 2021-05-24 NOTE — Progress Notes (Signed)
RN called to room by pts daughter. Pt is A&O x4 but had c/o of intermittent confusion and losing her train of thought. This just started this evening. Pt's VS stable. MD notified. Informed to avoid giving any Benzodiazepines and that we may need to back off of her Remeron. Delirium precautions initiated. RN will inform oncoming nightshift RN.

## 2021-05-25 DIAGNOSIS — I10 Essential (primary) hypertension: Secondary | ICD-10-CM | POA: Diagnosis not present

## 2021-05-25 DIAGNOSIS — R1901 Right upper quadrant abdominal swelling, mass and lump: Secondary | ICD-10-CM | POA: Diagnosis not present

## 2021-05-25 DIAGNOSIS — R918 Other nonspecific abnormal finding of lung field: Secondary | ICD-10-CM | POA: Diagnosis not present

## 2021-05-25 DIAGNOSIS — K769 Liver disease, unspecified: Secondary | ICD-10-CM | POA: Diagnosis not present

## 2021-05-25 LAB — PREALBUMIN: Prealbumin: 7.6 mg/dL — ABNORMAL LOW (ref 18–38)

## 2021-05-25 MED ORDER — MIRTAZAPINE 15 MG PO TABS
15.0000 mg | ORAL_TABLET | Freq: Every day | ORAL | Status: DC
  Administered 2021-05-25 – 2021-05-27 (×3): 15 mg via ORAL
  Filled 2021-05-25 (×3): qty 1

## 2021-05-25 MED ORDER — KCL IN DEXTROSE-NACL 20-5-0.45 MEQ/L-%-% IV SOLN
INTRAVENOUS | Status: DC
Start: 2021-05-25 — End: 2021-06-02
  Filled 2021-05-25 (×16): qty 1000

## 2021-05-25 NOTE — Progress Notes (Signed)
   05/25/21 1750  Assess: MEWS Score  Temp 98.6 F (37 C)  BP 132/72  Pulse Rate (!) 135  Resp 20  Level of Consciousness Alert  SpO2 95 %  O2 Device Room Air  Assess: MEWS Score  MEWS Temp 0  MEWS Systolic 0  MEWS Pulse 3  MEWS RR 0  MEWS LOC 0  MEWS Score 3  MEWS Score Color Yellow  Assess: if the MEWS score is Yellow or Red  Were vital signs taken at a resting state? Yes  Focused Assessment No change from prior assessment  Does the patient meet 2 or more of the SIRS criteria? No  MEWS guidelines implemented *See Row Information* Yes  Treat  MEWS Interventions Administered scheduled meds/treatments  Take Vital Signs  Increase Vital Sign Frequency  Yellow: Q 2hr X 2 then Q 4hr X 2, if remains yellow, continue Q 4hrs  Escalate  MEWS: Escalate Yellow: discuss with charge nurse/RN and consider discussing with provider and RRT  Notify: Charge Nurse/RN  Name of Charge Nurse/RN Notified Wendy, RN  Date Charge Nurse/RN Notified 05/25/21  Time Charge Nurse/RN Notified 1800  Notify: Provider  Provider Name/Title Cyndia Skeeters  Date Provider Notified 05/25/21  Time Provider Notified 1800  Notification Type Page  Notification Reason Requested by patient/family  Provider response See new orders  Date of Provider Response 05/25/21  Time of Provider Response 1800  Document  Patient Outcome Stabilized after interventions  Progress note created (see row info) Yes  Assess: SIRS CRITERIA  SIRS Temperature  0  SIRS Pulse 1  SIRS Respirations  0  SIRS WBC 0  SIRS Score Sum  1   Pt in Yellow MEWS d/t HR of 135. Pt has had decreased intake today and some intermittent confusion that started yesterday evening. MD aware and placed new order for IV fluids. Charge RN aware. Yellow MEWS implemented.

## 2021-05-25 NOTE — Evaluation (Signed)
Physical Therapy Evaluation Patient Details Name: Karla Chavez MRN: [id number] DOB: [birth year] Today's Date: 05/25/2021   History of Present Illness  Pt admitted 2* ABD pain and diarrhea and found to have LLL nodule/mass, multiple hypodense liver lesions and L renal cyst.  Pt with hx of Vertigo and aortic aneurysm.  Clinical Impression  Pt admitted as above and presenting with functional mobility limitations 2* generalized weakness, mild ambulatory balance deficits and decreased endurance.  Pt plans to dc home - states she lives alone but all 4 children live around her and are very supportive.    Follow Up Recommendations No PT follow up    Equipment Recommendations  None recommended by PT    Recommendations for Other Services       Precautions / Restrictions Precautions Precautions: Fall Restrictions Weight Bearing Restrictions: No      Mobility  Bed Mobility Overal bed mobility: Modified Independent             General bed mobility comments: Pt unassisted to move to EOB sitting    Transfers Overall transfer level: Needs assistance Equipment used: None Transfers: Sit to/from Stand Sit to Stand: Min guard         General transfer comment: Steady assist for safety  Ambulation/Gait Ambulation/Gait assistance: Min guard Gait Distance (Feet): 240 Feet Assistive device: None;1 person hand held assist Gait Pattern/deviations: Step-through pattern;Decreased step length - right;Decreased step length - left;Shuffle;Narrow base of support     General Gait Details: Mod pace, brief standing rest breaks 2* fatigue.  Mild general in stability with narrowed BOS but no over LOB noted.  Stairs            Wheelchair Mobility    Modified Rankin (Stroke Patients Only)       Balance Overall balance assessment: Needs assistance Sitting-balance support: No upper extremity supported;Feet supported Sitting balance-Leahy Scale: Good     Standing balance  support: No upper extremity supported Standing balance-Leahy Scale: Fair                               Pertinent Vitals/Pain Pain Assessment: No/denies pain    Home Living Family/patient expects to be discharged to:: Private residence Living Arrangements: Alone Available Help at Discharge: Family;Available PRN/intermittently Type of Home: House Home Access: Stairs to enter Entrance Stairs-Rails: Right Entrance Stairs-Number of Steps: 4 Home Layout: One level Home Equipment: Walker - 2 wheels;Cane - single point      Prior Function Level of Independence: Independent               Hand Dominance        Extremity/Trunk Assessment   Upper Extremity Assessment Upper Extremity Assessment: Defer to OT evaluation    Lower Extremity Assessment Lower Extremity Assessment: Generalized weakness       Communication   Communication: No difficulties  Cognition Arousal/Alertness: Awake/alert Behavior During Therapy: WFL for tasks assessed/performed Overall Cognitive Status: Within Functional Limits for tasks assessed                                        General Comments      Exercises     Assessment/Plan    PT Assessment Patient needs continued PT services  PT Problem List Decreased strength;Decreased activity tolerance;Decreased balance;Decreased mobility;Decreased knowledge of use of DME  PT Treatment Interventions DME instruction;Gait training;Stair training;Functional mobility training;Therapeutic activities;Therapeutic exercise;Balance training;Patient/family education    PT Goals (Current goals can be found in the Care Plan section)  Acute Rehab PT Goals Patient Stated Goal: HOME PT Goal Formulation: With patient Time For Goal Achievement: 06/08/21 Potential to Achieve Goals: Good    Frequency Min 3X/week   Barriers to discharge        Co-evaluation               AM-PAC PT "6 Clicks" Mobility  Outcome  Measure Help needed turning from your back to your side while in a flat bed without using bedrails?: None Help needed moving from lying on your back to sitting on the side of a flat bed without using bedrails?: None Help needed moving to and from a bed to a chair (including a wheelchair)?: A Little Help needed standing up from a chair using your arms (e.g., wheelchair or bedside chair)?: A Little Help needed to walk in hospital room?: A Little Help needed climbing 3-5 steps with a railing? : A Little 6 Click Score: 20    End of Session Equipment Utilized During Treatment: Gait belt Activity Tolerance: Patient tolerated treatment well Patient left: in chair;with call bell/phone within reach;with chair alarm set;with family/visitor present Nurse Communication: Mobility status PT Visit Diagnosis: Difficulty in walking, not elsewhere classified (R26.2);Unsteadiness on feet (R26.81)    Time: [id number] PT Time Calculation (min) (ACUTE ONLY): 23 min   Charges:   PT Evaluation $PT Eval Low Complexity: 1 Low          [place] Pager [phone] Office [phone]   Aleksandr Pellow 05/25/2021, 12:47 PM

## 2021-05-25 NOTE — Progress Notes (Signed)
PROGRESS NOTE  Karla Chavez [id number] DOB: [birth year]   PCP: Aletha Halim., PA-C  Patient is from: Home.  DOA: 05/18/2021 LOS: 7  Chief complaints:  Chief Complaint  Patient presents with   Abdominal Pain   Diarrhea   Failure To Thrive     Brief Narrative / Interim history: 86 year old F with PMH of HTN, HLD, vertigo, LLL nodule, osteoarthritis, GERD, appendectomy and aortic aneurysm presenting with intermittent diarrhea, abdominal pain, decreased appetite and about 15 to 20 pound unintentional weight loss in the last 2 months.  She had CT abdomen and pelvis on 7/12 that showed multiple liver lesions, wall thickening in transverse colon and large complex left renal cyst. She had GI appointment for 7/18 but pain acutely gotten worse and prompted her to come to ED.    In ED, slightly tachycardic.  Lipase 93.  CT chest/abdomen/pelvis showed LLL density extending to the pleural surface with interval change in morphology of some GG density in the left lower lobe solid and spiculated mass about 2.7 cm concerning for lung cancer, as well as multiple ill-defined hypodense liver lesions concerning for metastasis and 5.6 cm complex lobulated cyst in the midpole left kidney.  C. difficile negative.  GIP with EPEC.  Started on azithromycin. Diarrhea seems to have improved.  Patient underwent liver biopsy on 05/23/2021.  Pathology pending.  P.o. intake remains poor. She may need feeding tube if no improvement.    Subjective: Seen and examined earlier this morning.  Per overnight RN, she was a sleepy and did not take his bedtime meds.  She is awake this morning.  She reports good night.  She has not had diarrhea last night.  She was a kind of surprised about this.  She has not had breakfast yet.  She says she ate her dinner.  Denies chest pain or dyspnea.  Denies nausea, vomiting or abdominal pain.  She is hoping to avoid feeding tube  Objective: Vitals:   05/24/21 1704 05/24/21 1828  05/24/21 2028 05/25/21 0519  BP: 133/71 127/76 114/65 129/66  Pulse: 89 84 (!) 56 89  Resp:   18 18  Temp:  98.5 F (36.9 C) 98.5 F (36.9 C) 98.8 F (37.1 C)  TempSrc:  Axillary Oral Oral  SpO2:   94% 91%  Weight:      Height:        Intake/Output Summary (Last 24 hours) at 05/25/2021 1235 Last data filed at 05/25/2021 0900 Gross per 24 hour  Intake 340 ml  Output --  Net 340 ml   Filed Weights   05/19/21 0042  Weight: 62.6 kg    Examination:  GENERAL: No apparent distress.  Nontoxic. HEENT: MMM.  Vision and hearing grossly intact.  NECK: Supple.  No apparent JVD.  RESP: On RA.  No IWOB.  Fair aeration bilaterally. CVS: PACs. Heart sounds normal.  ABD/GI/GU: BS+. Abd soft, NTND.  MSK/EXT:  Moves extremities. No apparent deformity. No edema.  SKIN: no apparent skin lesion or wound NEURO: Awake and alert. Oriented appropriately.  No apparent focal neuro deficit. PSYCH: Calm. Normal affect.   Procedures:  7/22-liver biopsy by IR on 05/23/2021.  Microbiology summarized: COVID-19 and influenza PCR nonreactive.  Assessment & Plan: LLL nodule/mass/multiple ill-defined hypodense liver lesions-CT shows interval change in morphology of previously noted 2.7 cm GG density in LLL that appears more solid and spiculated concerning for lung cancer, and multiple liver lesions concerning for metastatic disease. -S/p liver biopsy on 05/23/2021.  Follow-up pathology. -  Ambulatory referral to oncology ordered  Abdominal pain/epigastric tenderness-Resolved. Infectious diarrhea due to EPEC-completed 3 days of azithromycin 500 mg on 7/21.  C. difficile negative. Decreased appetite/oral intake/unintentional weight loss-multifactorial.  Appetite remains poor. Elevated lipase-improving.  No radiologic evidence of pancreatitis -GI following-EGD and flex sig on hold -P.o. Protonix 40 mg twice daily -Continue regular diet and supplements -Decrease Remeron to 15 mg nightly-did not tolerate 30  mg -Started Marinol 2.5 mg AC on 7/23. -Continue multivitamins and supplements -Appreciate help by dietitian -Asked RN to chart percent of meals eaten -May need feeding tube if no improvement in appetite although she is hesitant about this  Large complex complex lobulated left renal cyst: Noted on CT. measures 5.6 x 4 x 4 cm.  Was 3.1 cm on [place] in 2007 and 2011. -Need further evaluation with abdominal MRI once clinically stable unable to hold breath -Discussed this with patient and patient's son at bedside.  Primary hyperthyroidism: TSH low at 0.014.  Free T4 elevated to 2.54.  Could be contributing to her anorexia -Changed metoprolol to Inderal  -Needs outpatient follow-up with endocrinology -Hold off methimazole in case she needs RAI study outpatient.  Premature atrial contraction: Stable. -Inderal as above. -Discontinue telemetry  Essential hypertension: Normotensive. -Continue home amlodipine and Avapro -Continue Inderal as above   Hyperlipidemia:  -Continue home statin.   Anxiety and depression: Stable. -Continue home medications -Remeron as above  Shortness of breath: Normal saturation on room air.  Lung exam reassuring.  Could be due to #1.  Resolved. -Encourage incentive spirometry  Ascending aortic aneurysm: 3.8 cm  on CT angio chest in 2017. -Needs on annual imaging on this  Hypokalemia/hypomagnesemia: Resolved.  Metabolic acidosis: Likely from IV fluid and diarrhea.  Stable. -Management as above   Unintentional weight loss Body mass index is 21.62 kg/m. Nutrition Problem: Inadequate oral intake Etiology: poor appetite, diarrhea Signs/Symptoms: per patient/family report Interventions: MVI, Prostat, Snacks, Magic cup   DVT prophylaxis:  enoxaparin (LOVENOX) injection 40 mg Start: 05/23/21 2000  Code Status: Full code Family Communication: Patient and/or Therapist, sports.  Updated patient's son at bedside. Level of care: Telemetry Status is: Inpatient  Remains  inpatient appropriate because:Inpatient level of care appropriate due to severity of illness and inadequate caloric intake  Dispo: The patient is from: Home              Anticipated d/c is to: Home              Patient currently is not medically stable to d/c.   Difficult to place patient No       Consultants:  Interventional radiology-signed off Gastroenterology-signed off   Sch Meds:  Scheduled Meds:  (feeding supplement) PROSource Plus  30 mL Oral TID BM   amLODipine  5 mg Oral Daily   atorvastatin  40 mg Oral Daily   citalopram  10 mg Oral Daily   clopidogrel  75 mg Oral Daily   dronabinol  2.5 mg Oral BID AC   enoxaparin (LOVENOX) injection  40 mg Subcutaneous Q24H   feeding supplement  237 mL Oral BID BM   irbesartan  150 mg Oral Daily   mirtazapine  15 mg Oral QHS   multivitamin with minerals  1 tablet Oral Daily   pantoprazole  40 mg Oral BID   propranolol  20 mg Oral TID   protein supplement  1 Scoop Oral TID WC   Continuous Infusions:    PRN Meds:.acetaminophen **OR** acetaminophen, alum & mag hydroxide-simeth, loperamide,  ondansetron **OR** ondansetron (ZOFRAN) IV  Antimicrobials: Anti-infectives (From admission, onward)    Start     Dose/Rate Route Frequency Ordered Stop   05/20/21 1700  azithromycin (ZITHROMAX) 500 mg in sodium chloride 0.9 % 250 mL IVPB        500 mg 250 mL/hr over 60 Minutes Intravenous Daily 05/20/21 1607 05/22/21 1045        I have personally reviewed the following labs and images: CBC: Recent Labs  Lab 05/19/21 0351 05/20/21 0735 05/21/21 0403 05/22/21 0422 05/23/21 0402 05/24/21 0457  WBC 5.9 6.9 6.7 6.3 6.7  --   HGB 12.1 12.5 11.9* 11.9* 11.5* 12.0  HCT 35.5* 36.7 35.6* 35.1* 34.2* 35.3*  MCV 92.7 92.7 93.0 92.1 92.9  --   PLT 229 238 269 255 257  --    BMP &GFR Recent Labs  Lab 05/20/21 0735 05/21/21 0403 05/22/21 0422 05/23/21 0402 05/24/21 0457  NA 143 143 143 143 141  K 3.4* 3.9 3.6 3.5 3.8  CL  113* 114* 115* 116* 112*  CO2 22 19* 20* 20* 20*  GLUCOSE 102* 97 94 94 80  BUN '14 17 12 12 11  '$ CREATININE 0.60 0.54 0.48 0.56 0.51  CALCIUM 9.2 9.0 8.7* 8.4* 8.9  MG 1.5* 1.8 1.6* 1.8 2.0  PHOS 3.6 3.2 2.9 3.0 3.5   Estimated Creatinine Clearance: 50.9 mL/min (by C-G formula based on SCr of 0.51 mg/dL). Liver & Pancreas: Recent Labs  Lab 05/18/21 2021 05/19/21 0351 05/20/21 0735 05/21/21 0403 05/22/21 0422 05/23/21 0402 05/24/21 0457  AST 45* 40 40  --   --  41  --   ALT '23 21 25  '$ --   --  27  --   ALKPHOS 134* 123 129*  --   --  121  --   BILITOT 0.7 0.5 0.8  --   --  0.7  --   PROT 6.3* 5.7* 5.9*  --   --  5.5*  --   ALBUMIN 3.3* 3.1* 3.0* 3.0* 2.8* 2.9* 3.0*   Recent Labs  Lab 05/18/21 2021 05/19/21 0706 05/20/21 0735 05/21/21 0403  LIPASE 93* 91* 66* 70*   Recent Labs  Lab 05/23/21 0402  AMMONIA 30   Diabetic: No results for input(s): HGBA1C in the last 72 hours. No results for input(s): GLUCAP in the last 168 hours. Cardiac Enzymes: No results for input(s): CKTOTAL, CKMB, CKMBINDEX, TROPONINI in the last 168 hours. No results for input(s): PROBNP in the last 8760 hours. Coagulation Profile: No results for input(s): INR, PROTIME in the last 168 hours. Thyroid Function Tests: Recent Labs    05/22/21 1432 05/23/21 0402  TSH  --  0.014*  FREET4 2.54*  --    Lipid Profile: No results for input(s): CHOL, HDL, LDLCALC, TRIG, CHOLHDL, LDLDIRECT in the last 72 hours. Anemia Panel: No results for input(s): VITAMINB12, FOLATE, FERRITIN, TIBC, IRON, RETICCTPCT in the last 72 hours.  Urine analysis:    Component Value Date/Time   COLORURINE YELLOW 05/18/2021 2000   APPEARANCEUR CLEAR 05/18/2021 2000   LABSPEC 1.020 05/18/2021 2000   PHURINE 6.0 05/18/2021 2000   GLUCOSEU NEGATIVE 05/18/2021 2000   HGBUR MODERATE (A) 05/18/2021 2000   BILIRUBINUR NEGATIVE 05/18/2021 2000   KETONESUR NEGATIVE 05/18/2021 2000   PROTEINUR NEGATIVE 05/18/2021 2000    UROBILINOGEN 0.2 06/27/2009 0855   NITRITE NEGATIVE 05/18/2021 2000   LEUKOCYTESUR NEGATIVE 05/18/2021 2000   Sepsis Labs: Invalid input(s): PROCALCITONIN, LACTICIDVEN  Microbiology: Recent Results (from the past  240 hour(s))  Resp Panel by RT-PCR (Flu A&B, Covid) Nasopharyngeal Swab     Status: None   Collection Time: 05/18/21  8:21 PM   Specimen: Nasopharyngeal Swab; Nasopharyngeal(NP) swabs in vial transport medium  Result Value Ref Range Status   SARS Coronavirus 2 by RT PCR NEGATIVE NEGATIVE Final    Comment: (NOTE) SARS-CoV-2 target nucleic acids are NOT DETECTED.  The SARS-CoV-2 RNA is generally detectable in upper respiratory specimens during the acute phase of infection. The lowest concentration of SARS-CoV-2 viral copies this assay can detect is 138 copies/mL. A negative result does not preclude SARS-Cov-2 infection and should not be used as the sole basis for treatment or other patient management decisions. A negative result may occur with  improper specimen collection/handling, submission of specimen other than nasopharyngeal swab, presence of viral mutation(s) within the areas targeted by this assay, and inadequate number of viral copies(<138 copies/mL). A negative result must be combined with clinical observations, patient history, and epidemiological information. The expected result is Negative.  Fact Sheet for Patients:  EntrepreneurPulse.com.au  Fact Sheet for Healthcare Providers:  IncredibleEmployment.be  This test is no t yet approved or cleared by the [place] FDA and  has been authorized for detection and/or diagnosis of SARS-CoV-2 by FDA under an Emergency Use Authorization (EUA). This EUA will remain  in effect (meaning this test can be used) for the duration of the COVID-19 declaration under Section 564(b)(1) of the Act, 21 U.S.C.section 360bbb-3(b)(1), unless the authorization is terminated  or revoked sooner.        Influenza A by PCR NEGATIVE NEGATIVE Final   Influenza B by PCR NEGATIVE NEGATIVE Final    Comment: (NOTE) The Xpert Xpress SARS-CoV-2/FLU/RSV plus assay is intended as an aid in the diagnosis of influenza from Nasopharyngeal swab specimens and should not be used as a sole basis for treatment. Nasal washings and aspirates are unacceptable for Xpert Xpress SARS-CoV-2/FLU/RSV testing.  Fact Sheet for Patients: EntrepreneurPulse.com.au  Fact Sheet for Healthcare Providers: IncredibleEmployment.be  This test is not yet approved or cleared by the [place] FDA and has been authorized for detection and/or diagnosis of SARS-CoV-2 by FDA under an Emergency Use Authorization (EUA). This EUA will remain in effect (meaning this test can be used) for the duration of the COVID-19 declaration under Section 564(b)(1) of the Act, 21 U.S.C. section 360bbb-3(b)(1), unless the authorization is terminated or revoked.  Performed at [facility], [place] [address], [place], [place] [address]   Gastrointestinal Panel by PCR , Stool     Status: Abnormal   Collection Time: 05/19/21 11:31 AM   Specimen: Stool  Result Value Ref Range Status   Campylobacter species NOT DETECTED NOT DETECTED Final   Plesimonas shigelloides NOT DETECTED NOT DETECTED Final   Salmonella species NOT DETECTED NOT DETECTED Final   Yersinia enterocolitica NOT DETECTED NOT DETECTED Final   Vibrio species NOT DETECTED NOT DETECTED Final   Vibrio cholerae NOT DETECTED NOT DETECTED Final   Enteroaggregative E coli (EAEC) NOT DETECTED NOT DETECTED Final   Enteropathogenic E coli (EPEC) DETECTED (A) NOT DETECTED Final    Comment: Baldwin [place], RN AT [id number] ON 05/20/21 BY JRH   Enterotoxigenic E coli (ETEC) NOT DETECTED NOT DETECTED Final   Shiga like toxin producing E coli (STEC) NOT DETECTED NOT DETECTED Final   Shigella/Enteroinvasive E coli (EIEC) NOT DETECTED NOT  DETECTED Final   Cryptosporidium NOT DETECTED NOT DETECTED Final   Cyclospora cayetanensis NOT DETECTED NOT DETECTED  Final   Entamoeba histolytica NOT DETECTED NOT DETECTED Final   Giardia lamblia NOT DETECTED NOT DETECTED Final   Adenovirus F40/41 NOT DETECTED NOT DETECTED Final   Astrovirus NOT DETECTED NOT DETECTED Final   Norovirus GI/GII NOT DETECTED NOT DETECTED Final   Rotavirus A NOT DETECTED NOT DETECTED Final   Sapovirus (I, II, IV, and V) NOT DETECTED NOT DETECTED Final    Comment: Performed at [facility] [address], [place], [place] [address]  C Difficile Quick Screen w PCR reflex     Status: None   Collection Time: 05/19/21 11:31 AM   Specimen: Stool  Result Value Ref Range Status   C Diff antigen NEGATIVE NEGATIVE Final   C Diff toxin NEGATIVE NEGATIVE Final   C Diff interpretation No C. difficile detected.  Final    Comment: Performed at [facility], [place] [address], [place], [place] [address]    Radiology Studies: No results found.     Santosha Jividen T. [place]  If 7PM-7AM, please contact night-coverage [email or web address] 05/25/2021, 12:35 PM

## 2021-05-25 NOTE — Progress Notes (Signed)
Occupational Therapy Evaluation  Patient lives home alone in a single level house, has 4 children "that live all around me" can provide assist as needed. At baseline patient is independent with self care, does not use adaptive device. Patient is able to perform lower body dressing task seated edge of bed without assistance, supervision to min guard with no loss of balance with ambulation in hallway and transfers. Patient initially disoriented to month/year however able to correctly recall after ~10 minutes. Able to follow directions appropriately with no other safety concerns noted at this time. Patient appears close to her baseline and do not anticipate any further OT needs at D/C. Patient has been ambulating in hallway with mobility specialist, continue to encourage out of bed mobility during remainder of hospitalization. No further acute OT needs at this time, will sign off. Please re-consult if new needs arise.    05/25/21 1300  OT Visit Information  Last OT Received On 05/25/21  Assistance Needed +1  PT/OT/SLP Co-Evaluation/Treatment Yes  Reason for Co-Treatment To address functional/ADL transfers  History of Present Illness Pt admitted 2* ABD pain and diarrhea and found to have LLL nodule/mass, multiple hypodense liver lesions and L renal cyst.  Pt with hx of Vertigo and aortic aneurysm.  Precautions  Precautions Fall  Restrictions  Weight Bearing Restrictions No  Home Living  Family/patient expects to be discharged to: Private residence  Living Arrangements Alone  Available Help at Discharge Family;Available PRN/intermittently  Type of Home House  Home Access Stairs to enter  Entrance Stairs-Number of Steps 4  Entrance Stairs-Rails Right  Home Layout One level  Bathroom Shower/Tub Walk-in shower  Bathroom Toilet Handicapped height  Home Equipment [place] - 2 wheels;Cane - single point;Grab bars - toilet;Grab bars - tub/shower  Prior Function  Level of Independence Independent   Communication  Communication No difficulties  Pain Assessment  Pain Assessment No/denies pain  Cognition  Arousal/Alertness Awake/alert  Behavior During Therapy WFL for tasks assessed/performed  Overall Cognitive Status Impaired/Different from baseline  Area of Impairment Orientation  Orientation Level Disoriented to;Time  General Comments patient initially stating it is June and 2002, able to correctly recall at end of evaluation when asked ~10 minutes later. RN did state patient recently had woken up from nap upon therapy entering room. patient otherwise following directions appropriately  Upper Extremity Assessment  Upper Extremity Assessment Overall WFL for tasks assessed  Lower Extremity Assessment  Lower Extremity Assessment Defer to PT evaluation  ADL  Overall ADL's  At baseline  General ADL Comments patient demonstrates ability to perform lower body dressing task, functional transfer and ambulation without adaptive device. min guard to supervision for safety however patient with no overt loss of balance during mobility.  Bed Mobility  Overal bed mobility Modified Independent  General bed mobility comments Pt unassisted to move to EOB sitting  Transfers  Overall transfer level Needs assistance  Equipment used None  Transfers Sit to/from Stand  Sit to Stand Supervision  General transfer comment S for safety  Balance  Overall balance assessment Needs assistance  Sitting-balance support No upper extremity supported;Feet supported  Sitting balance-Leahy Scale Good  Standing balance support No upper extremity supported  Standing balance-Leahy Scale Fair  OT - End of Session  Activity Tolerance Patient tolerated treatment well  Patient left in chair;with call bell/phone within reach;with chair alarm set;with family/visitor present  Nurse Communication Mobility status  OT Assessment  OT Recommendation/Assessment Patient does not need any further OT services  OT Visit  Diagnosis Other abnormalities of gait and mobility (R26.89)  OT Problem List Decreased activity tolerance  AM-PAC OT "6 Clicks" Daily Activity Outcome Measure (Version 2)  Help from another person eating meals? 4  Help from another person taking care of personal grooming? 4  Help from another person toileting, which includes using toliet, bedpan, or urinal? 3  Help from another person bathing (including washing, rinsing, drying)? 4  Help from another person to put on and taking off regular upper body clothing? 4  Help from another person to put on and taking off regular lower body clothing? 4  6 Click Score 23  Progressive Mobility  What is the highest level of mobility based on the progressive mobility assessment? Level 5 (Walks with assist in room/hall) - Balance while stepping forward/back and can walk in room with assist - Complete  Mobility Ambulated with assistance in hallway;Out of bed to chair with meals  OT Recommendation  Follow Up Recommendations No OT follow up  OT Equipment Tub/shower seat  Acute Rehab OT Goals  Patient Stated Goal home  OT Goal Formulation All assessment and education complete, DC therapy  OT Time Calculation  OT Start Time (ACUTE ONLY) 1149  OT Stop Time (ACUTE ONLY) 1207  OT Time Calculation (min) 18 min  OT General Charges  $OT Visit 1 Visit  OT Evaluation  $OT Eval Low Complexity 1 Low  Written Expression  Dominant Hand  (did not specify)   Delbert Phenix OT OT pager: [phone]

## 2021-05-25 NOTE — Progress Notes (Signed)
Pt was falling asleep prior to me giving lovenox at 2045 and stated she just wanted to go back to sleep. Pt was not awakened for her 2200 meds, due to desire to sleep and some ? Of delirium setting in today. Frequent visits to the room / bedside noted patient to be resting comfortably.

## 2021-05-26 ENCOUNTER — Inpatient Hospital Stay: Payer: Medicare (Managed Care)

## 2021-05-26 ENCOUNTER — Encounter: Payer: Self-pay | Admitting: *Deleted

## 2021-05-26 ENCOUNTER — Inpatient Hospital Stay: Payer: Medicare (Managed Care) | Admitting: Internal Medicine

## 2021-05-26 DIAGNOSIS — R1901 Right upper quadrant abdominal swelling, mass and lump: Secondary | ICD-10-CM | POA: Diagnosis not present

## 2021-05-26 DIAGNOSIS — Z7189 Other specified counseling: Secondary | ICD-10-CM | POA: Diagnosis not present

## 2021-05-26 DIAGNOSIS — R531 Weakness: Secondary | ICD-10-CM | POA: Diagnosis not present

## 2021-05-26 DIAGNOSIS — R918 Other nonspecific abnormal finding of lung field: Secondary | ICD-10-CM | POA: Diagnosis not present

## 2021-05-26 DIAGNOSIS — K769 Liver disease, unspecified: Secondary | ICD-10-CM | POA: Diagnosis not present

## 2021-05-26 DIAGNOSIS — I10 Essential (primary) hypertension: Secondary | ICD-10-CM | POA: Diagnosis not present

## 2021-05-26 DIAGNOSIS — Z515 Encounter for palliative care: Secondary | ICD-10-CM | POA: Diagnosis not present

## 2021-05-26 LAB — CREATININE, SERUM
Creatinine, Ser: 0.61 mg/dL (ref 0.44–1.00)
GFR, Estimated: >60 mL/min (ref >60)

## 2021-05-26 MED ORDER — PROPRANOLOL HCL 40 MG PO TABS
40.0000 mg | ORAL_TABLET | Freq: Three times a day (TID) | ORAL | Status: DC
  Administered 2021-05-26 – 2021-06-01 (×21): 40 mg via ORAL
  Filled 2021-05-26 (×2): qty 1
  Filled 2021-05-26: qty 2
  Filled 2021-05-26 (×2): qty 1
  Filled 2021-05-26: qty 2
  Filled 2021-05-26: qty 1
  Filled 2021-05-26 (×3): qty 2
  Filled 2021-05-26: qty 1
  Filled 2021-05-26: qty 2
  Filled 2021-05-26: qty 4
  Filled 2021-05-26 (×2): qty 2
  Filled 2021-05-26: qty 1
  Filled 2021-05-26: qty 4
  Filled 2021-05-26: qty 2
  Filled 2021-05-26 (×2): qty 1
  Filled 2021-05-26: qty 2
  Filled 2021-05-26 (×2): qty 1
  Filled 2021-05-26: qty 4
  Filled 2021-05-26 (×6): qty 1
  Filled 2021-05-26: qty 2
  Filled 2021-05-26: qty 4
  Filled 2021-05-26: qty 1
  Filled 2021-05-26: qty 4
  Filled 2021-05-26 (×2): qty 1
  Filled 2021-05-26 (×3): qty 2
  Filled 2021-05-26 (×4): qty 1
  Filled 2021-05-26: qty 2
  Filled 2021-05-26: qty 4

## 2021-05-26 NOTE — Consult Note (Signed)
Consultation Note Date: 05/26/2021   Patient Name: Karla Chavez  DOB: 1935-09-20  MRN: [id number]  Age / Sex: 86 y.o., female  PCP: Aletha Halim., PA-C Referring Physician: Mercy Riding, MD  Reason for Consultation: Establishing goals of care  HPI/Patient Profile: 86 y.o. female   admitted on 05/18/2021   Clinical Assessment and Goals of Care:  86 year old F with PMH of HTN, HLD, vertigo, LLL nodule, osteoarthritis, GERD, appendectomy and aortic aneurysm presenting with intermittent diarrhea, abdominal pain, decreased appetite and about 15 to 20 pound unintentional weight loss in the last 2 months.  She had CT abdomen and pelvis on 7/12 that showed multiple liver lesions, wall thickening in transverse colon and large complex left renal cyst. She had GI appointment for 7/18 but pain acutely gotten worse and prompted her to come to ED.      CT chest/abdomen/pelvis showed LLL density extending to the pleural surface with interval change in morphology of some GG density in the left lower lobe solid and spiculated mass about 2.7 cm concerning for lung cancer, as well as multiple ill-defined hypodense liver lesions concerning for metastasis and 5.6 cm complex lobulated cyst in the midpole left kidney.      Patient underwent liver biopsy on 05/23/2021.  Pathology pending.  A palliative consult has been requested for goals of care discussions. Patient is awake alert, walking back to her bed from her bathroom, her daughter and friend are in the room with her. I introduced myself and palliative care as follows: Palliative medicine is specialized medical care for people living with serious illness. It focuses on providing relief from the symptoms and stress of a serious illness. The goal is to improve quality of life for both the patient and the family. Goals of care: Broad aims of medical therapy in relation to the  patient's values and preferences. Our aim is to provide medical care aimed at enabling patients to achieve the goals that matter most to them, given the circumstances of their particular medical situation and their constraints.   Patient asks why palliative care has been asked to see her, she states that she doesn't like to think about things like hospice. We spent some time talking about hospital based palliative/supportive services and our role, patient's current condition and how PMT can try to be of assistance going forward. See below.   NEXT OF KIN  Daughter. Patient lives by herself and has children who live nearby.   SUMMARY OF RECOMMENDATIONS    Full Code, Full Scope of care for now.  Await biopsy results and oncology consultation In today's initial palliative care encounter, a brief introduction of palliative/supportive services was provided. Differences between hospice and palliative were explored, offered introductions and attempted connections and trust building.  PMT to follow.   Code Status/Advance Care Planning: Full code   Symptom Management:     Palliative Prophylaxis:  Delirium Protocol  Additional Recommendations (Limitations, Scope, Preferences): Full Scope Treatment  Psycho-social/Spiritual:  Desire for further Chaplaincy support:yes Additional Recommendations:  Caregiving  Support/Resources  Prognosis:  Unable to determine  Discharge Planning: To Be Determined      Primary Diagnoses: Present on Admission:  Abdominal mass  Coronary artery calcification seen on CAT scan  Hyperlipidemia  Hypertension  Abdominal pain  Anxiety and depression   I have reviewed the medical record, interviewed the patient and family, and examined the patient. The following aspects are pertinent.  Past Medical History:  Diagnosis Date   Adenomatous colon polyp 03/2010   Allergy    Aortic aneurysm (House)    Arthritis    "joints" (03/01/2014)   Family history of  anesthesia complication    pts sister has severe nausea and vomiting   GERD (gastroesophageal reflux disease)    History of blood transfusion 1958   "related to childbirth"   Hyperlipidemia    Hypertension    Near syncope    Palpitations    Pneumonia 06/2009   PONV (postoperative nausea and vomiting)    "this OR" (03/01/2014)   Vertigo    hx of occured on 01/01/14   Social History   Socioeconomic History   Marital status: Married    Spouse name: Not on file   Number of children: 4   Years of education: Not on file   Highest education level: Not on file  Occupational History   Occupation: retired  Tobacco Use   Smoking status: Never   Smokeless tobacco: Never  Substance and Sexual Activity   Alcohol use: No   Drug use: No   Sexual activity: Never  Other Topics Concern   Not on file  Social History Narrative   Not on file   Social Determinants of Health   Financial Resource Strain: Not on file  Food Insecurity: Not on file  Transportation Needs: Not on file  Physical Activity: Not on file  Stress: Not on file  Social Connections: Not on file   Family History  Problem Relation Age of Onset   Hypertension Mother    Stroke Mother 78   Heart disease Father 16       massive MI   Heart disease Sister 43       CABG   Heart disease Brother 31       CABG   Scheduled Meds:  (feeding supplement) PROSource Plus  30 mL Oral TID BM   amLODipine  5 mg Oral Daily   atorvastatin  40 mg Oral Daily   citalopram  10 mg Oral Daily   clopidogrel  75 mg Oral Daily   dronabinol  2.5 mg Oral BID AC   enoxaparin (LOVENOX) injection  40 mg Subcutaneous Q24H   feeding supplement  237 mL Oral BID BM   irbesartan  150 mg Oral Daily   mirtazapine  15 mg Oral QHS   multivitamin with minerals  1 tablet Oral Daily   pantoprazole  40 mg Oral BID   propranolol  40 mg Oral TID   protein supplement  1 Scoop Oral TID WC   Continuous Infusions:  dextrose 5 % and 0.45 % NaCl with KCl 20  mEq/L 75 mL/hr at 05/26/21 0426   PRN Meds:.acetaminophen **OR** acetaminophen, alum & mag hydroxide-simeth, loperamide, ondansetron **OR** ondansetron (ZOFRAN) IV Medications Prior to Admission:  Prior to Admission medications   Medication Sig Start Date End Date Taking? Authorizing Provider  ALPRAZolam Duanne Moron) 0.5 MG tablet Take 0.5 mg by mouth at bedtime as needed for anxiety.   Yes [provider]  amLODipine (NORVASC) 5 MG  tablet Take 1 tablet (5 mg total) by mouth daily. 10/04/17  Yes Martinique, Peter M, MD  atorvastatin (LIPITOR) 80 MG tablet Take 1 tablet (80 mg total) by mouth daily. 03/12/20  Yes [place], Peter M, MD  citalopram (CELEXA) 10 MG tablet Take 10 mg by mouth daily.   Yes [provider]  clopidogrel (PLAVIX) 75 MG tablet Take by mouth. 09/12/20  Yes [provider]  metoprolol tartrate (LOPRESSOR) 25 MG tablet Take 1 tablet (25 mg total) by mouth 2 (two) times daily. NEED OV. Patient taking differently: Take 25 mg by mouth 2 (two) times daily. 01/03/18  Yes [place], Peter M, MD  olmesartan (BENICAR) 20 MG tablet TAKE 1 TABLET BY MOUTH EVERY DAY 01/20/21  Yes [place], Peter M, MD   Allergies  Allergen Reactions   Benazepril Hcl Hives    Per patient and Doctor believed medication caused Hives    Review of Systems Generalized weakness  Physical Exam Awake alert Able to ambulate in the room with minimal assistance Regular work of breathing S 1 S 2  Abdomen is not distended No edema No focal deficits  Vital Signs: BP 126/70   Pulse 73   Temp 98.2 F (36.8 C) (Oral)   Resp 18   Ht '5\' 7"'$  (1.702 m)   Wt 62.6 kg   LMP  (LMP Unknown)   SpO2 95%   BMI 21.62 kg/m  Pain Scale: 0-10   Pain Score: 0-No pain   SpO2: SpO2: 95 % O2 Device:SpO2: 95 % O2 Flow Rate: .O2 Flow Rate (L/min): 2 L/min  IO: Intake/output summary:  Intake/Output Summary (Last 24 hours) at 05/26/2021 1400 Last data filed at 05/26/2021 0900 Gross per 24 hour  Intake 749.7  ml  Output --  Net 749.7 ml    LBM: Last BM Date: 05/24/21 Baseline Weight: Weight: 62.6 kg Most recent weight: Weight: 62.6 kg     Palliative Assessment/Data:   PPS 40%  Time In:  1300 Time Out:  1400 Time Total:  60  Greater than 50%  of this time was spent counseling and coordinating care related to the above assessment and plan.  Signed by: Loistine Chance, MD   Please contact Palliative Medicine Team phone at [phone] for questions and concerns.  For individual provider: See Shea Evans

## 2021-05-26 NOTE — Care Management Important Message (Signed)
Important Message  Patient Details IM Letter given to the Patient. Name: Karla Chavez MRN: [id number] Date of Birth: [birth year]   Medicare Important Message Given:  Yes     Kerin Salen 05/26/2021, 11:29 AM

## 2021-05-26 NOTE — Plan of Care (Signed)

## 2021-05-26 NOTE — Progress Notes (Signed)
I was updated that Karla Chavez is in the hospital and will not make her appt to see Dr. Julien Nordmann today. I will work with new patient coordinator to get her re-scheduled.

## 2021-05-26 NOTE — Progress Notes (Signed)
Spoke with female patient family member regarding visitor policy.  Questioning if pt could have son come visit even though 2 visitors only allowed.  Family member states that brother was allowed to visit yesterday even though 2 visitors already in room.  Writer notified the family member of the 2 visitor per day policy and recommended family taking turns each day to visit the patient.  Family member verbalized understanding.

## 2021-05-26 NOTE — Progress Notes (Signed)
PROGRESS NOTE  Karla Chavez [id number] DOB: 05-12-1935   PCP: Aletha Halim., PA-C  Patient is from: Home.  DOA: 05/18/2021 LOS: 8  Chief complaints:  Chief Complaint  Patient presents with   Abdominal Pain   Diarrhea   Failure To Thrive     Brief Narrative / Interim history: 86 year old F with PMH of HTN, HLD, vertigo, LLL nodule, osteoarthritis, GERD, appendectomy and aortic aneurysm presenting with intermittent diarrhea, abdominal pain, decreased appetite and about 15 to 20 pound unintentional weight loss in the last 2 months.  She had CT abdomen and pelvis on 7/12 that showed multiple liver lesions, wall thickening in transverse colon and large complex left renal cyst. She had GI appointment for 7/18 but pain acutely gotten worse and prompted her to come to ED.    In ED, slightly tachycardic.  Lipase 93.  CT chest/abdomen/pelvis showed LLL density extending to the pleural surface with interval change in morphology of some GG density in the left lower lobe solid and spiculated mass about 2.7 cm concerning for lung cancer, as well as multiple ill-defined hypodense liver lesions concerning for metastasis and 5.6 cm complex lobulated cyst in the midpole left kidney.  C. difficile negative.  GIP with EPEC.  Started on azithromycin. Diarrhea seems to have improved.  Patient underwent liver biopsy on 05/23/2021.  Pathology pending.  P.o. intake remains poor.  She is very hesitant about feeding tube.    Subjective: Seen and examined earlier this morning.  No major events overnight of this morning.  She said she did not have a good sleep.  P.o. intake remains poor.  She has about 25% of her breakfast yesterday and this morning.  Ate 50% of her lunch yesterday.  She does not recall about her dinner which was not charted either.  No nausea, vomiting, diarrhea or abdominal pain.  Denies UTI symptoms.  Objective: Vitals:   05/25/21 2307 05/26/21 0524 05/26/21 1348 05/26/21 1349   BP: 124/80 (!) 149/95 126/70   Pulse: (!) 48 (!) 104 73   Resp: '16 18 18   '$ Temp: 98.1 F (36.7 C) 98.2 F (36.8 C)  98.2 F (36.8 C)  TempSrc: Oral Oral  Axillary  SpO2: 93% 91% 95%   Weight:      Height:        Intake/Output Summary (Last 24 hours) at 05/26/2021 1440 Last data filed at 05/26/2021 0900 Gross per 24 hour  Intake 749.7 ml  Output --  Net 749.7 ml   Filed Weights   05/19/21 0042  Weight: 62.6 kg    Examination:  GENERAL: No apparent distress.  Nontoxic. HEENT: MMM.  Vision and hearing grossly intact.  NECK: Supple.  No apparent JVD.  RESP: Tachycardic to 100.  Irregular rhythm due to PACs.  Fair aeration bilaterally. CVS:  RRR. Heart sounds normal.  ABD/GI/GU: BS+. Abd soft, NTND.  MSK/EXT:  Moves extremities. No apparent deformity. No edema.  SKIN: no apparent skin lesion or wound NEURO: Awake and alert. Oriented appropriately.  No apparent focal neuro deficit. PSYCH: Calm. Normal affect.   Procedures:  7/22-liver biopsy by IR on 05/23/2021.  Microbiology summarized: COVID-19 and influenza PCR nonreactive.  Assessment & Plan: LLL nodule/mass/multiple ill-defined hypodense liver lesions-CT shows interval change in morphology of previously noted 2.7 cm GG density in LLL that appears more solid and spiculated concerning for lung cancer, and multiple liver lesions concerning for metastatic disease. -S/p liver biopsy on 05/23/2021.  Follow-up pathology. -Ambulatory referral to oncology  ordered  Decreased appetite/oral intake/unintentional weight loss-likely due to the above.  Appetite remains poor.  Prealbumin 7.6. -Continue regular diet and supplements -Continue multivitamin and nightly Remeron 15 mg.  Did not tolerate 30 mg -Started Marinol 2.5 mg AC on 7/23 -Consulted dietitian again, I requested daily follow-up -Patient is very hesitant about feeding tube -Treat hyperthyroidism as below -Recheck weight  Abdominal pain/epigastric  tenderness-Resolved. Infectious diarrhea due to EPEC-completed 3 days of azithromycin on 7/21.  C. difficile negative.  Resolved. Elevated lipase-No radiologic evidence of pancreatitis.  Resolved. -GI signed off. -P.o. Protonix 40 mg twice daily  Large complex complex lobulated left renal cyst: Noted on CT. measures 5.6 x 4 x 4 cm.  Was 3.1 cm on [place] in 2007 and 2011. -Need further evaluation with abdominal MRI once clinically stable unable to hold breath -Discussed this with patient and patient's son at bedside.  Primary hyperthyroidism: TSH low at 0.014.  Free T4 elevated to 2.54.  Could be contributing to her anorexia -Changed metoprolol to Inderal-increase Inderal to 40 mg 3 times daily. -Needs outpatient follow-up with endocrinology -Hold off methimazole in case she needs RAI study outpatient.  Sinus tachycardia with premature atrial contraction: HR 100. -Increase Inderal to 40 mg 3 times daily -Discontinue telemetry  Essential hypertension: BP slightly elevated. -Continue home amlodipine and Avapro -Increase Inderal as above   Hyperlipidemia:  -Continue home statin.   Anxiety/depression/insomnia: Stable. -Continue home medications -Remeron as above  Shortness of breath: Normal saturation on room air.  Lung exam reassuring.  Could be due to #1.  Resolved. -Encourage incentive spirometry  Ascending aortic aneurysm: 3.8 cm  on CT angio chest in 2017. -Needs on annual imaging on this  Hypokalemia/hypomagnesemia: Resolved.  Metabolic acidosis: Likely from IV fluid and diarrhea.  Stable. -Management as above   Unintentional weight loss Body mass index is 21.62 kg/m. Nutrition Problem: Inadequate oral intake Etiology: poor appetite, diarrhea Signs/Symptoms: per patient/family report Interventions: MVI, Prostat, Snacks, Magic cup  Wt Readings from Last 10 Encounters:  05/19/21 62.6 kg  09/18/20 70 kg  05/21/20 68.5 kg  07/18/19 72.6 kg  01/16/19 73.9 kg  10/14/18  69.3 kg  10/28/16 68.5 kg  10/14/16 68.5 kg  10/12/16 69 kg  02/27/16 64.9 kg     DVT prophylaxis:  enoxaparin (LOVENOX) injection 40 mg Start: 05/23/21 2000  Code Status: Full code Family Communication: Patient and/or Therapist, sports.  Updated patient's daughter at bedside. Level of care: Telemetry Status is: Inpatient  Remains inpatient appropriate because:Inpatient level of care appropriate due to severity of illness and inadequate caloric intake  Dispo: The patient is from: Home              Anticipated d/c is to: Home              Patient currently is not medically stable to d/c.   Difficult to place patient No       Consultants:  Interventional radiology-signed off Gastroenterology-signed off Palliative medicine   Sch Meds:  Scheduled Meds:  (feeding supplement) PROSource Plus  30 mL Oral TID BM   amLODipine  5 mg Oral Daily   atorvastatin  40 mg Oral Daily   citalopram  10 mg Oral Daily   clopidogrel  75 mg Oral Daily   dronabinol  2.5 mg Oral BID AC   enoxaparin (LOVENOX) injection  40 mg Subcutaneous Q24H   feeding supplement  237 mL Oral BID BM   irbesartan  150 mg Oral Daily  mirtazapine  15 mg Oral QHS   multivitamin with minerals  1 tablet Oral Daily   pantoprazole  40 mg Oral BID   propranolol  40 mg Oral TID   protein supplement  1 Scoop Oral TID WC   Continuous Infusions:  dextrose 5 % and 0.45 % NaCl with KCl 20 mEq/L 75 mL/hr at 05/26/21 0426     PRN Meds:.acetaminophen **OR** acetaminophen, alum & mag hydroxide-simeth, loperamide, ondansetron **OR** ondansetron (ZOFRAN) IV  Antimicrobials: Anti-infectives (From admission, onward)    Start     Dose/Rate Route Frequency Ordered Stop   05/20/21 1700  azithromycin (ZITHROMAX) 500 mg in sodium chloride 0.9 % 250 mL IVPB        500 mg 250 mL/hr over 60 Minutes Intravenous Daily 05/20/21 1607 05/22/21 1045        I have personally reviewed the following labs and images: CBC: Recent Labs  Lab  05/20/21 0735 05/21/21 0403 05/22/21 0422 05/23/21 0402 05/24/21 0457  WBC 6.9 6.7 6.3 6.7  --   HGB 12.5 11.9* 11.9* 11.5* 12.0  HCT 36.7 35.6* 35.1* 34.2* 35.3*  MCV 92.7 93.0 92.1 92.9  --   PLT 238 269 255 257  --    BMP &GFR Recent Labs  Lab 05/20/21 0735 05/21/21 0403 05/22/21 0422 05/23/21 0402 05/24/21 0457 05/26/21 0504  NA 143 143 143 143 141  --   K 3.4* 3.9 3.6 3.5 3.8  --   CL 113* 114* 115* 116* 112*  --   CO2 22 19* 20* 20* 20*  --   GLUCOSE 102* 97 94 94 80  --   BUN '14 17 12 12 11  '$ --   CREATININE 0.60 0.54 0.48 0.56 0.51 0.61  CALCIUM 9.2 9.0 8.7* 8.4* 8.9  --   MG 1.5* 1.8 1.6* 1.8 2.0  --   PHOS 3.6 3.2 2.9 3.0 3.5  --    Estimated Creatinine Clearance: 50.9 mL/min (by C-G formula based on SCr of 0.61 mg/dL). Liver & Pancreas: Recent Labs  Lab 05/20/21 0735 05/21/21 0403 05/22/21 0422 05/23/21 0402 05/24/21 0457  AST 40  --   --  41  --   ALT 25  --   --  27  --   ALKPHOS 129*  --   --  121  --   BILITOT 0.8  --   --  0.7  --   PROT 5.9*  --   --  5.5*  --   ALBUMIN 3.0* 3.0* 2.8* 2.9* 3.0*   Recent Labs  Lab 05/20/21 0735 05/21/21 0403  LIPASE 66* 70*   Recent Labs  Lab 05/23/21 0402  AMMONIA 30   Diabetic: No results for input(s): HGBA1C in the last 72 hours. No results for input(s): GLUCAP in the last 168 hours. Cardiac Enzymes: No results for input(s): CKTOTAL, CKMB, CKMBINDEX, TROPONINI in the last 168 hours. No results for input(s): PROBNP in the last 8760 hours. Coagulation Profile: No results for input(s): INR, PROTIME in the last 168 hours. Thyroid Function Tests: No results for input(s): TSH, T4TOTAL, FREET4, T3FREE, THYROIDAB in the last 72 hours.  Lipid Profile: No results for input(s): CHOL, HDL, LDLCALC, TRIG, CHOLHDL, LDLDIRECT in the last 72 hours. Anemia Panel: No results for input(s): VITAMINB12, FOLATE, FERRITIN, TIBC, IRON, RETICCTPCT in the last 72 hours.  Urine analysis:    Component Value Date/Time    COLORURINE YELLOW 05/18/2021 2000   APPEARANCEUR CLEAR 05/18/2021 2000   LABSPEC 1.020 05/18/2021 2000  PHURINE 6.0 05/18/2021 2000   GLUCOSEU NEGATIVE 05/18/2021 2000   HGBUR MODERATE (A) 05/18/2021 2000   BILIRUBINUR NEGATIVE 05/18/2021 2000   KETONESUR NEGATIVE 05/18/2021 2000   PROTEINUR NEGATIVE 05/18/2021 2000   UROBILINOGEN 0.2 06/27/2009 0855   NITRITE NEGATIVE 05/18/2021 2000   LEUKOCYTESUR NEGATIVE 05/18/2021 2000   Sepsis Labs: Invalid input(s): PROCALCITONIN, [place]  Microbiology: Recent Results (from the past 240 hour(s))  Resp Panel by RT-PCR (Flu A&B, Covid) Nasopharyngeal Swab     Status: None   Collection Time: 05/18/21  8:21 PM   Specimen: Nasopharyngeal Swab; Nasopharyngeal(NP) swabs in vial transport medium  Result Value Ref Range Status   SARS Coronavirus 2 by RT PCR NEGATIVE NEGATIVE Final    Comment: (NOTE) SARS-CoV-2 target nucleic acids are NOT DETECTED.  The SARS-CoV-2 RNA is generally detectable in upper respiratory specimens during the acute phase of infection. The lowest concentration of SARS-CoV-2 viral copies this assay can detect is 138 copies/mL. A negative result does not preclude SARS-Cov-2 infection and should not be used as the sole basis for treatment or other patient management decisions. A negative result may occur with  improper specimen collection/handling, submission of specimen other than nasopharyngeal swab, presence of viral mutation(s) within the areas targeted by this assay, and inadequate number of viral copies(<138 copies/mL). A negative result must be combined with clinical observations, patient history, and epidemiological information. The expected result is Negative.  Fact Sheet for Patients:  EntrepreneurPulse.com.au  Fact Sheet for Healthcare Providers:  IncredibleEmployment.be  This test is no t yet approved or cleared by the [place] FDA and  has been authorized for  detection and/or diagnosis of SARS-CoV-2 by FDA under an Emergency Use Authorization (EUA). This EUA will remain  in effect (meaning this test can be used) for the duration of the COVID-19 declaration under Section 564(b)(1) of the Act, 21 U.S.C.section 360bbb-3(b)(1), unless the authorization is terminated  or revoked sooner.       Influenza A by PCR NEGATIVE NEGATIVE Final   Influenza B by PCR NEGATIVE NEGATIVE Final    Comment: (NOTE) The Xpert Xpress SARS-CoV-2/FLU/RSV plus assay is intended as an aid in the diagnosis of influenza from Nasopharyngeal swab specimens and should not be used as a sole basis for treatment. Nasal washings and aspirates are unacceptable for Xpert Xpress SARS-CoV-2/FLU/RSV testing.  Fact Sheet for Patients: EntrepreneurPulse.com.au  Fact Sheet for Healthcare Providers: IncredibleEmployment.be  This test is not yet approved or cleared by the [place] FDA and has been authorized for detection and/or diagnosis of SARS-CoV-2 by FDA under an Emergency Use Authorization (EUA). This EUA will remain in effect (meaning this test can be used) for the duration of the COVID-19 declaration under Section 564(b)(1) of the Act, 21 U.S.C. section 360bbb-3(b)(1), unless the authorization is terminated or revoked.  Performed at [facility], [place] [address], [place], [place] [address]   Gastrointestinal Panel by PCR , Stool     Status: Abnormal   Collection Time: 05/19/21 11:31 AM   Specimen: Stool  Result Value Ref Range Status   Campylobacter species NOT DETECTED NOT DETECTED Final   Plesimonas shigelloides NOT DETECTED NOT DETECTED Final   Salmonella species NOT DETECTED NOT DETECTED Final   Yersinia enterocolitica NOT DETECTED NOT DETECTED Final   Vibrio species NOT DETECTED NOT DETECTED Final   Vibrio cholerae NOT DETECTED NOT DETECTED Final   Enteroaggregative E coli (EAEC) NOT DETECTED NOT  DETECTED Final   Enteropathogenic E coli (EPEC) DETECTED (A)  NOT DETECTED Final    Comment: Baldwin [place], RN AT [id number] ON 05/20/21 BY [facility]   Enterotoxigenic E coli (ETEC) NOT DETECTED NOT DETECTED Final   Shiga like toxin producing E coli (STEC) NOT DETECTED NOT DETECTED Final   Shigella/Enteroinvasive E coli (EIEC) NOT DETECTED NOT DETECTED Final   Cryptosporidium NOT DETECTED NOT DETECTED Final   Cyclospora cayetanensis NOT DETECTED NOT DETECTED Final   Entamoeba histolytica NOT DETECTED NOT DETECTED Final   Giardia lamblia NOT DETECTED NOT DETECTED Final   Adenovirus F40/41 NOT DETECTED NOT DETECTED Final   Astrovirus NOT DETECTED NOT DETECTED Final   Norovirus GI/GII NOT DETECTED NOT DETECTED Final   Rotavirus A NOT DETECTED NOT DETECTED Final   Sapovirus (I, II, IV, and V) NOT DETECTED NOT DETECTED Final    Comment: Performed at [facility], [address] [place], [place] [address]  C Difficile Quick Screen w PCR reflex     Status: None   Collection Time: 05/19/21 11:31 AM   Specimen: Stool  Result Value Ref Range Status   C Diff antigen NEGATIVE NEGATIVE Final   C Diff toxin NEGATIVE NEGATIVE Final   C Diff interpretation No C. difficile detected.  Final    Comment: Performed at [facility], [place] [address], [place], [place] [address]    Radiology Studies: No results found.     Lyndsy Gilberto T. [place]  If 7PM-7AM, please contact night-coverage [email or web address] 05/26/2021, 2:40 PM

## 2021-05-27 ENCOUNTER — Inpatient Hospital Stay (HOSPITAL_COMMUNITY): Payer: Medicare (Managed Care)

## 2021-05-27 DIAGNOSIS — E440 Moderate protein-calorie malnutrition: Secondary | ICD-10-CM | POA: Insufficient documentation

## 2021-05-27 DIAGNOSIS — R918 Other nonspecific abnormal finding of lung field: Secondary | ICD-10-CM | POA: Diagnosis not present

## 2021-05-27 DIAGNOSIS — R1901 Right upper quadrant abdominal swelling, mass and lump: Secondary | ICD-10-CM | POA: Diagnosis not present

## 2021-05-27 DIAGNOSIS — I10 Essential (primary) hypertension: Secondary | ICD-10-CM | POA: Diagnosis not present

## 2021-05-27 DIAGNOSIS — K769 Liver disease, unspecified: Secondary | ICD-10-CM | POA: Diagnosis not present

## 2021-05-27 MED ORDER — GADOBUTROL 1 MMOL/ML IV SOLN
7.0000 mL | Freq: Once | INTRAVENOUS | Status: AC | PRN
  Administered 2021-05-27: 7 mL via INTRAVENOUS

## 2021-05-27 MED ORDER — LORAZEPAM 2 MG/ML IJ SOLN
1.0000 mg | Freq: Once | INTRAMUSCULAR | Status: AC | PRN
  Administered 2021-05-27: 1 mg via INTRAVENOUS
  Filled 2021-05-27: qty 1

## 2021-05-27 NOTE — Progress Notes (Signed)
Nutrition Follow-up  DOCUMENTATION CODES:  Non-severe (moderate) malnutrition in context of chronic illness  INTERVENTION:  Add: Vital Cuisine Shake BID, each supplement provides 520 kcal and 22 grams of protein. Carnation Instant Breakfast po TID, each supplement provides 140 kcal and 5 grams of protein.  Recommend placement of small-bore NG tube.  Initiate tube feeding via small-bore NGT: Osmolite 1.5 at 20 ml/h and increase by 10 ml every 8 hrs to goal rate of 45 ml/h (1080 ml per day) Prosource TF 45 ml daily  150 ml water flushes every 4 hrs  Provides 1660 kcal, 79 gm protein, 1721 ml water daily.  Monitor magnesium, potassium, and phosphorus daily for at least 3 days, MD to replete as needed, as pt is at risk for refeeding syndrome.   Discontinue Beneprotein TID and Ensure Enlive BID.  Continue ProSource Plus TID and MVI with minerals.  NUTRITION DIAGNOSIS:  Moderate Malnutrition related to chronic illness as evidenced by moderate fat depletion, moderate muscle depletion, energy intake < or equal to 50% for > or equal to 1 month.  GOAL:  Patient will meet greater than or equal to 90% of their needs  MONITOR:  PO intake, Supplement acceptance, Labs, Weight trends, I & O's  REASON FOR ASSESSMENT:  Consult Poor PO  ASSESSMENT:  86 year old F with PMH of HTN, HLD, vertigo, LLL nodule, osteoarthritis, GERD, appendectomy and aortic aneurysm presenting with intermittent diarrhea, abdominal pain, decreased appetite and about 15 to 20 pound unintentional weight loss in the last 2 months.  She had CT abdomen and pelvis on 7/12 that showed multiple liver lesions, wall thickening in transverse colon and large complex left renal cyst. She had GI appointment for 7/18 but pain acutely gotten worse and prompted her to come to ED. 7/22 - liver biopsy in IR  Admit wt: 62.6 kg Current wt: 65.1 kg  Per MD, pt is very hesitant about feeding tube. Given that pt is a full code, the next  step is tube feeding given that the pt has already had a calorie count and is not meeting her needs PO.  RD to leave recommendations for TF when and if pt is ready to have the tube placed.  Spoke with pt and daughter at bedside. Pt is more interested in trying to eat. Pt is benefiting from Marinol per daughter. She was able to eat most of her dinner yesterday because she was hungry.  For now, discontinue ProSource Plus, Ensure Enlive, and Beneprotein supplements as pt does not like them. Add Hormel shake BID and CIB TID.  Medications: reviewed; ProSource Plus TID, Marinol BID, EE BID, Remeron, MVI with minerals, Protonix, Beneprotein TID, D5 with NaCl and K-Cl 20 mEq @ 75 ml/hr via IV  Labs: reviewed  NUTRITION - FOCUSED PHYSICAL EXAM: Flowsheet Row Most Recent Value  Orbital Region Moderate depletion  Upper Arm Region Moderate depletion  Thoracic and Lumbar Region No depletion  Buccal Region Moderate depletion  Temple Region Moderate depletion  Clavicle Bone Region Moderate depletion  Clavicle and Acromion Bone Region Moderate depletion  Scapular Bone Region Moderate depletion  Dorsal Hand Mild depletion  Patellar Region Mild depletion  Anterior Thigh Region Mild depletion  Posterior Calf Region Mild depletion  Edema (RD Assessment) None  Hair Reviewed  Mouth Reviewed  Skin Reviewed  Nails Reviewed   Diet Order:   Diet Order             Diet regular Room service appropriate? Yes; Fluid consistency: Thin  Diet  effective now                  EDUCATION NEEDS:  Education needs have been addressed  Skin:  Skin Assessment: Reviewed RN Assessment  Last BM:  05/24/21 - Type 7, medium  Height:  Ht Readings from Last 1 Encounters:  05/19/21 '5\' 7"'$  (1.702 m)   Weight:  Wt Readings from Last 1 Encounters:  05/27/21 65.1 kg   BMI:  Body mass index is 22.48 kg/m.  Estimated Nutritional Needs:  Kcal:  1550-1750 Protein:  70-85g Fluid:  1.7L/day  Derrel Nip, RD,  LDN (she/her/hers) Registered Dietitian I After-Hours/Weekend Pager # in [place]

## 2021-05-27 NOTE — Progress Notes (Signed)
Physical Therapy Treatment Patient Details Name: Karla Chavez MRN: [id number] DOB: [birth year] Today's Date: 05/27/2021    History of Present Illness Pt admitted 2* ABD pain and diarrhea and found to have LLL nodule/mass, multiple hypodense liver lesions and L renal cyst.  Pt with hx of Vertigo and aortic aneurysm.    PT Comments    Pt with abdominal pain requiring increased time to sit up to EOB and power to stand. Pt limited with ambulation due to abdominal pain, requests to return to room for seated rest break. Pt tolerates seated BLE strengthening exercises without increased pain; educated on additional sets/reps throughout the day and pt verbalized agreement. RN notified of pt's pain complaints. Will continue to progress acute PT as able.    Follow Up Recommendations  No PT follow up     Equipment Recommendations  None recommended by PT    Recommendations for Other Services       Precautions / Restrictions Precautions Precautions: Fall Restrictions Weight Bearing Restrictions: No    Mobility  Bed Mobility Overal bed mobility: Modified Independent  General bed mobility comments: slow to mobilize to sitting EOB without assist or VCs, limited by abdominal pain    Transfers Overall transfer level: Needs assistance Equipment used: None Transfers: Sit to/from Stand Sit to Stand: Min guard  General transfer comment: BUE assisting to power to stand, maintains trunk flexed in standing 2* abdominal pain, able to upright with increased time, slightly unsteady initially improving with time  Ambulation/Gait Ambulation/Gait assistance: Min guard Gait Distance (Feet): 30 Feet Assistive device: IV Pole;1 person hand held assist Gait Pattern/deviations: Step-through pattern;Decreased stride length;Narrow base of support;Trunk flexed Gait velocity: decreased   General Gait Details: narrow BOS with trunk flexed over 2* abdominal pain, ambulates slightly out into hallway they  requests to return back to room due to pain, slightly unsteady without LOB   Stairs             Wheelchair Mobility    Modified Rankin (Stroke Patients Only)       Balance Overall balance assessment: Needs assistance Sitting-balance support: Feet supported Sitting balance-Leahy Scale: Good Sitting balance - Comments: seated EOB   Standing balance support: During functional activity;Bilateral upper extremity supported Standing balance-Leahy Scale: Poor Standing balance comment: reliant on UE support       Cognition Arousal/Alertness: Awake/alert Behavior During Therapy: WFL for tasks assessed/performed Overall Cognitive Status: Within Functional Limits for tasks assessed       Exercises General Exercises - Lower Extremity Long Arc Quad: Seated;AROM;Strengthening;Both;10 reps Hip Flexion/Marching: Seated;AROM;Strengthening;Both;10 reps Toe Raises: Seated;AROM;Strengthening;Both;10 reps    General Comments        Pertinent Vitals/Pain Pain Assessment: 0-10 Pain Score: 5  Pain Location: abdomen Pain Descriptors / Indicators: Aching;Sore Pain Intervention(s): Limited activity within patient's tolerance;Monitored during session;Repositioned;Patient requesting pain meds-RN notified    Home Living                      Prior Function            PT Goals (current goals can now be found in the care plan section) Acute Rehab PT Goals Patient Stated Goal: home PT Goal Formulation: With patient Time For Goal Achievement: 06/08/21 Potential to Achieve Goals: Good Progress towards PT goals: Progressing toward goals    Frequency    Min 3X/week      PT Plan Current plan remains appropriate    Co-evaluation  AM-PAC PT "6 Clicks" Mobility   Outcome Measure  Help needed turning from your back to your side while in a flat bed without using bedrails?: None Help needed moving from lying on your back to sitting on the side of a flat  bed without using bedrails?: None Help needed moving to and from a bed to a chair (including a wheelchair)?: A Little Help needed standing up from a chair using your arms (e.g., wheelchair or bedside chair)?: A Little Help needed to walk in hospital room?: A Little Help needed climbing 3-5 steps with a railing? : A Little 6 Click Score: 20    End of Session Equipment Utilized During Treatment: Gait belt Activity Tolerance: Patient tolerated treatment well Patient left: in chair;with call bell/phone within reach;with chair alarm set Nurse Communication: Mobility status;Patient requests pain meds PT Visit Diagnosis: Difficulty in walking, not elsewhere classified (R26.2);Unsteadiness on feet (R26.81)     Time: [id number] PT Time Calculation (min) (ACUTE ONLY): 22 min  Charges:  $Therapeutic Exercise: 8-22 mins                      Tori Mikaeel Petrow PT, DPT 05/27/21, 10:26 AM

## 2021-05-27 NOTE — Progress Notes (Signed)
PROGRESS NOTE  Karla Chavez [id number] DOB: [birth year]   PCP: Aletha Halim., PA-C  Patient is from: Home.  DOA: 05/18/2021 LOS: 9  Chief complaints:  Chief Complaint  Patient presents with   Abdominal Pain   Diarrhea   Failure To Thrive     Brief Narrative / Interim history: 86 year old F with PMH of HTN, HLD, vertigo, LLL nodule, osteoarthritis, GERD, appendectomy and aortic aneurysm presenting with intermittent diarrhea, abdominal pain, decreased appetite and about 15 to 20 pound unintentional weight loss in the last 2 months.  She had CT abdomen and pelvis on 7/12 that showed multiple liver lesions, wall thickening in transverse colon and large complex left renal cyst. She had GI appointment for 7/18 but pain acutely gotten worse and prompted her to come to ED.    In ED, slightly tachycardic.  Lipase 93.  CT chest/abdomen/pelvis showed LLL density extending to the pleural surface with interval change in morphology of some GG density in the left lower lobe solid and spiculated mass about 2.7 cm concerning for lung cancer, as well as multiple ill-defined hypodense liver lesions concerning for metastasis and 5.6 cm complex lobulated cyst in the midpole left kidney.  C. difficile negative.  GIP with EPEC.  Started on azithromycin. Diarrhea seems to have improved.  Patient underwent liver biopsy on 05/23/2021.  Pathology pending.  P.o. intake remains poor.  She is very hesitant about feeding tube but pathology result might help with this decision moving forward   Subjective: Seen and examined earlier this morning.  No major events overnight of this morning.  Reports having a good sleep.  Appetite remains poor.  Has been taking only 25 to 50% of her meals.  Denies nausea or vomiting.  Diarrhea seems to have resolved.  No UTI symptoms.  Objective: Vitals:   05/26/21 2125 05/27/21 0500 05/27/21 0527 05/27/21 0912  BP:   134/66 139/70  Pulse: 64  90 92  Resp:   18   Temp:    98.9 F (37.2 C)   TempSrc:   Oral   SpO2:   92% 95%  Weight:  65.1 kg    Height:        Intake/Output Summary (Last 24 hours) at 05/27/2021 1340 Last data filed at 05/26/2021 1753 Gross per 24 hour  Intake 1011.2 ml  Output --  Net 1011.2 ml   Filed Weights   05/19/21 0042 05/27/21 0500  Weight: 62.6 kg 65.1 kg    Examination:  GENERAL: No apparent distress.  Nontoxic. HEENT: MMM.  Vision and hearing grossly intact.  NECK: Supple.  No apparent JVD.  RESP:  No IWOB.  Fair aeration bilaterally. CVS: Irregular from PACs but normal rate.  Heart sounds normal.  ABD/GI/GU: BS+. Abd soft, NTND.  MSK/EXT:  Moves extremities. No apparent deformity. No edema.  SKIN: no apparent skin lesion or wound NEURO: Awake and alert. Oriented appropriately.  No apparent focal neuro deficit. PSYCH: Calm. Normal affect.   Procedures:  7/22-liver biopsy by IR on 05/23/2021.  Microbiology summarized: COVID-19 and influenza PCR nonreactive.  Assessment & Plan: LLL nodule/mass/multiple ill-defined hypodense liver lesions-CT shows interval change in morphology of previously noted 2.7 cm GG density in LLL that appears more solid and spiculated concerning for lung cancer, and multiple liver lesions concerning for metastatic disease. -S/p liver biopsy on 05/23/2021.  Pathology pending. -Ambulatory referral to oncology  Decreased appetite/oral intake/unintentional weight loss-likely due to the above.  Appetite remains poor.  Prealbumin 7.6. -Continue regular  diet and supplements -Continue multivitamin and nightly Remeron 15 mg.  Did not tolerate 30 mg -Started Marinol 2.5 mg AC on 7/23 -Consulted dietitian again, and requested close follow-up daily. -Patient is very hesitant about feeding tube, at least until liver pathology results. -Treat hyperthyroidism as below -Recheck weight  Abdominal pain/epigastric tenderness-Resolved. Infectious diarrhea due to EPEC-completed 3 days of azithromycin on  7/21.  C. difficile negative.  Resolved. Elevated lipase-No radiologic evidence of pancreatitis.  Resolved. -GI signed off. -P.o. Protonix 40 mg twice daily  Large complex complex lobulated left renal cyst: Noted on CT. measures 5.6 x 4 x 4 cm.  Was 3.1 cm on [place] in 2007 and 2011. -MRI abdomen  Primary hyperthyroidism: TSH low at 0.014.  Free T4 elevated to 2.54.  Could be contributing to her anorexia -Continue Inderal 40 mg 3 times daily -Needs outpatient follow-up with endocrinology -Hold off methimazole in case she needs RAI study outpatient.  Sinus tachycardia with PACs: Tachycardia resolved but still with PACs. -Continue inderal to 40 mg 3 times daily -Discontinue telemetry  Essential hypertension: BP improved. -Continue home amlodipine and Avapro -Increase Inderal as above   Hyperlipidemia:  -Continue home statin.   Anxiety/depression/insomnia: Stable. -Continue home medications -Remeron as above  Shortness of breath:  Resolved. -Encourage incentive spirometry  Ascending aortic aneurysm: 3.8 cm  on CT angio chest in 2017. -Needs on annual imaging on this  Hypokalemia/hypomagnesemia: Resolved.  Metabolic acidosis: Likely from IV fluid and diarrhea.  Stable. -Management as above   Unintentional weight loss Body mass index is 22.48 kg/m. Nutrition Problem: Inadequate oral intake Etiology: poor appetite, diarrhea Signs/Symptoms: per patient/family report Interventions: MVI, Prostat, Snacks, Magic cup  Wt Readings from Last 10 Encounters:  05/27/21 65.1 kg  09/18/20 70 kg  05/21/20 68.5 kg  07/18/19 72.6 kg  01/16/19 73.9 kg  10/14/18 69.3 kg  10/28/16 68.5 kg  10/14/16 68.5 kg  10/12/16 69 kg  02/27/16 64.9 kg     DVT prophylaxis:  enoxaparin (LOVENOX) injection 40 mg Start: 05/23/21 2000  Code Status: Full code Family Communication: Patient and/or Therapist, sports.  Updated patient's daughter at bedside. Level of care: Telemetry Status is: Inpatient  Remains  inpatient appropriate because:Inpatient level of care appropriate due to severity of illness and inadequate caloric intake  Dispo: The patient is from: Home              Anticipated d/c is to: Home              Patient currently is not medically stable to d/c.   Difficult to place patient No       Consultants:  Interventional radiology-signed off Gastroenterology-signed off Palliative medicine   Sch Meds:  Scheduled Meds:  (feeding supplement) PROSource Plus  30 mL Oral TID BM   amLODipine  5 mg Oral Daily   atorvastatin  40 mg Oral Daily   citalopram  10 mg Oral Daily   clopidogrel  75 mg Oral Daily   dronabinol  2.5 mg Oral BID AC   enoxaparin (LOVENOX) injection  40 mg Subcutaneous Q24H   feeding supplement  237 mL Oral BID BM   irbesartan  150 mg Oral Daily   mirtazapine  15 mg Oral QHS   multivitamin with minerals  1 tablet Oral Daily   pantoprazole  40 mg Oral BID   propranolol  40 mg Oral TID   protein supplement  1 Scoop Oral TID WC   Continuous Infusions:  dextrose 5 % and  0.45 % NaCl with KCl 20 mEq/L 75 mL/hr at 05/26/21 1500     PRN Meds:.acetaminophen **OR** acetaminophen, alum & mag hydroxide-simeth, loperamide, ondansetron **OR** ondansetron (ZOFRAN) IV  Antimicrobials: Anti-infectives (From admission, onward)    Start     Dose/Rate Route Frequency Ordered Stop   05/20/21 1700  azithromycin (ZITHROMAX) 500 mg in sodium chloride 0.9 % 250 mL IVPB        500 mg 250 mL/hr over 60 Minutes Intravenous Daily 05/20/21 1607 05/22/21 1045        I have personally reviewed the following labs and images: CBC: Recent Labs  Lab 05/21/21 0403 05/22/21 0422 05/23/21 0402 05/24/21 0457  WBC 6.7 6.3 6.7  --   HGB 11.9* 11.9* 11.5* 12.0  HCT 35.6* 35.1* 34.2* 35.3*  MCV 93.0 92.1 92.9  --   PLT 269 255 257  --    BMP &GFR Recent Labs  Lab 05/21/21 0403 05/22/21 0422 05/23/21 0402 05/24/21 0457 05/26/21 0504  NA 143 143 143 141  --   K 3.9 3.6  3.5 3.8  --   CL 114* 115* 116* 112*  --   CO2 19* 20* 20* 20*  --   GLUCOSE 97 94 94 80  --   BUN '17 12 12 11  '$ --   CREATININE 0.54 0.48 0.56 0.51 0.61  CALCIUM 9.0 8.7* 8.4* 8.9  --   MG 1.8 1.6* 1.8 2.0  --   PHOS 3.2 2.9 3.0 3.5  --    Estimated Creatinine Clearance: 50.9 mL/min (by C-G formula based on SCr of 0.61 mg/dL). Liver & Pancreas: Recent Labs  Lab 05/21/21 0403 05/22/21 0422 05/23/21 0402 05/24/21 0457  AST  --   --  41  --   ALT  --   --  27  --   ALKPHOS  --   --  121  --   BILITOT  --   --  0.7  --   PROT  --   --  5.5*  --   ALBUMIN 3.0* 2.8* 2.9* 3.0*   Recent Labs  Lab 05/21/21 0403  LIPASE 70*   Recent Labs  Lab 05/23/21 0402  AMMONIA 30   Diabetic: No results for input(s): HGBA1C in the last 72 hours. No results for input(s): GLUCAP in the last 168 hours. Cardiac Enzymes: No results for input(s): CKTOTAL, CKMB, CKMBINDEX, TROPONINI in the last 168 hours. No results for input(s): PROBNP in the last 8760 hours. Coagulation Profile: No results for input(s): INR, PROTIME in the last 168 hours. Thyroid Function Tests: No results for input(s): TSH, T4TOTAL, FREET4, T3FREE, THYROIDAB in the last 72 hours.  Lipid Profile: No results for input(s): CHOL, HDL, LDLCALC, TRIG, CHOLHDL, LDLDIRECT in the last 72 hours. Anemia Panel: No results for input(s): VITAMINB12, FOLATE, FERRITIN, TIBC, IRON, RETICCTPCT in the last 72 hours.  Urine analysis:    Component Value Date/Time   COLORURINE YELLOW 05/18/2021 2000   APPEARANCEUR CLEAR 05/18/2021 2000   LABSPEC 1.020 05/18/2021 2000   PHURINE 6.0 05/18/2021 2000   GLUCOSEU NEGATIVE 05/18/2021 2000   HGBUR MODERATE (A) 05/18/2021 2000   BILIRUBINUR NEGATIVE 05/18/2021 2000   KETONESUR NEGATIVE 05/18/2021 2000   PROTEINUR NEGATIVE 05/18/2021 2000   UROBILINOGEN 0.2 06/27/2009 0855   NITRITE NEGATIVE 05/18/2021 2000   LEUKOCYTESUR NEGATIVE 05/18/2021 2000   Sepsis Labs: Invalid input(s):  PROCALCITONIN, [place]  Microbiology: Recent Results (from the past 240 hour(s))  Resp Panel by RT-PCR (Flu A&B, Covid) Nasopharyngeal Swab  Status: None   Collection Time: 05/18/21  8:21 PM   Specimen: Nasopharyngeal Swab; Nasopharyngeal(NP) swabs in vial transport medium  Result Value Ref Range Status   SARS Coronavirus 2 by RT PCR NEGATIVE NEGATIVE Final    Comment: (NOTE) SARS-CoV-2 target nucleic acids are NOT DETECTED.  The SARS-CoV-2 RNA is generally detectable in upper respiratory specimens during the acute phase of infection. The lowest concentration of SARS-CoV-2 viral copies this assay can detect is 138 copies/mL. A negative result does not preclude SARS-Cov-2 infection and should not be used as the sole basis for treatment or other patient management decisions. A negative result may occur with  improper specimen collection/handling, submission of specimen other than nasopharyngeal swab, presence of viral mutation(s) within the areas targeted by this assay, and inadequate number of viral copies(<138 copies/mL). A negative result must be combined with clinical observations, patient history, and epidemiological information. The expected result is Negative.  Fact Sheet for Patients:  EntrepreneurPulse.com.au  Fact Sheet for Healthcare Providers:  IncredibleEmployment.be  This test is no t yet approved or cleared by the [place] FDA and  has been authorized for detection and/or diagnosis of SARS-CoV-2 by FDA under an Emergency Use Authorization (EUA). This EUA will remain  in effect (meaning this test can be used) for the duration of the COVID-19 declaration under Section 564(b)(1) of the Act, 21 U.S.C.section 360bbb-3(b)(1), unless the authorization is terminated  or revoked sooner.       Influenza A by PCR NEGATIVE NEGATIVE Final   Influenza B by PCR NEGATIVE NEGATIVE Final    Comment: (NOTE) The Xpert Xpress  SARS-CoV-2/FLU/RSV plus assay is intended as an aid in the diagnosis of influenza from Nasopharyngeal swab specimens and should not be used as a sole basis for treatment. Nasal washings and aspirates are unacceptable for Xpert Xpress SARS-CoV-2/FLU/RSV testing.  Fact Sheet for Patients: EntrepreneurPulse.com.au  Fact Sheet for Healthcare Providers: IncredibleEmployment.be  This test is not yet approved or cleared by the [place] FDA and has been authorized for detection and/or diagnosis of SARS-CoV-2 by FDA under an Emergency Use Authorization (EUA). This EUA will remain in effect (meaning this test can be used) for the duration of the COVID-19 declaration under Section 564(b)(1) of the Act, 21 U.S.C. section 360bbb-3(b)(1), unless the authorization is terminated or revoked.  Performed at [facility], [place] [address] [place], [place] [address]   Gastrointestinal Panel by PCR , Stool     Status: Abnormal   Collection Time: 05/19/21 11:31 AM   Specimen: Stool  Result Value Ref Range Status   Campylobacter species NOT DETECTED NOT DETECTED Final   Plesimonas shigelloides NOT DETECTED NOT DETECTED Final   Salmonella species NOT DETECTED NOT DETECTED Final   Yersinia enterocolitica NOT DETECTED NOT DETECTED Final   Vibrio species NOT DETECTED NOT DETECTED Final   Vibrio cholerae NOT DETECTED NOT DETECTED Final   Enteroaggregative E coli (EAEC) NOT DETECTED NOT DETECTED Final   Enteropathogenic E coli (EPEC) DETECTED (A) NOT DETECTED Final    Comment: Baldwin [place], RN AT [id number] ON 05/20/21 BY J[facility]   Enterotoxigenic E coli (ETEC) NOT DETECTED NOT DETECTED Final   Shiga like toxin producing E coli (STEC) NOT DETECTED NOT DETECTED Final   Shigella/Enteroinvasive E coli (EIEC) NOT DETECTED NOT DETECTED Final   Cryptosporidium NOT DETECTED NOT DETECTED Final   Cyclospora cayetanensis NOT DETECTED NOT DETECTED Final   Entamoeba  histolytica NOT DETECTED NOT DETECTED Final   Giardia lamblia NOT DETECTED  NOT DETECTED Final   Adenovirus F40/41 NOT DETECTED NOT DETECTED Final   Astrovirus NOT DETECTED NOT DETECTED Final   Norovirus GI/GII NOT DETECTED NOT DETECTED Final   Rotavirus A NOT DETECTED NOT DETECTED Final   Sapovirus (I, II, IV, and V) NOT DETECTED NOT DETECTED Final    Comment: Performed at [facility], [place], [place] [address]  C Difficile Quick Screen w PCR reflex     Status: None   Collection Time: 05/19/21 11:31 AM   Specimen: Stool  Result Value Ref Range Status   C Diff antigen NEGATIVE NEGATIVE Final   C Diff toxin NEGATIVE NEGATIVE Final   C Diff interpretation No C. difficile detected.  Final    Comment: Performed at [facility], [place] [address], [place], [place] [address]    Radiology Studies: No results found.     Maycol Hoying T. Nectar  If 7PM-7AM, please contact night-coverage [email or web address] 05/27/2021, 1:40 PM

## 2021-05-27 NOTE — Plan of Care (Signed)

## 2021-05-28 DIAGNOSIS — R1901 Right upper quadrant abdominal swelling, mass and lump: Secondary | ICD-10-CM | POA: Diagnosis not present

## 2021-05-28 LAB — CBC
HCT: 32.9 % — ABNORMAL LOW (ref 36.0–46.0)
Hemoglobin: 11.2 g/dL — ABNORMAL LOW (ref 12.0–15.0)
MCH: 31.4 pg (ref 26.0–34.0)
MCHC: 34 g/dL (ref 30.0–36.0)
MCV: 92.2 fL (ref 80.0–100.0)
Platelets: 231 10*3/uL (ref 150–400)
RBC: 3.57 MIL/uL — ABNORMAL LOW (ref 3.87–5.11)
RDW: 12.1 % (ref 11.5–15.5)
WBC: 7.9 10*3/uL (ref 4.0–10.5)
nRBC: 0 % (ref 0.0–0.2)

## 2021-05-28 LAB — RENAL FUNCTION PANEL
Albumin: 2.7 g/dL — ABNORMAL LOW (ref 3.5–5.0)
Anion gap: 13 (ref 5–15)
BUN: 11 mg/dL (ref 8–23)
CO2: 20 mmol/L — ABNORMAL LOW (ref 22–32)
Calcium: 8.9 mg/dL (ref 8.9–10.3)
Chloride: 111 mmol/L (ref 98–111)
Creatinine, Ser: 0.52 mg/dL (ref 0.44–1.00)
GFR, Estimated: >60 mL/min (ref >60)
Glucose, Bld: 97 mg/dL (ref 70–99)
Phosphorus: 3.5 mg/dL (ref 2.5–4.6)
Potassium: 3.5 mmol/L (ref 3.5–5.1)
Sodium: 144 mmol/L (ref 135–145)

## 2021-05-28 LAB — MAGNESIUM: Magnesium: 1.7 mg/dL (ref 1.7–2.4)

## 2021-05-28 NOTE — Progress Notes (Signed)
PROGRESS NOTE    Karla Chavez  [id number] DOB: 05-Dec-1934 DOA: 05/18/2021 PCP: Aletha Halim., PA-C     Brief Narrative:  Karla Chavez is an 86 year old F with PMH of HTN, HLD, vertigo, LLL nodule, osteoarthritis, GERD, appendectomy and aortic aneurysm presenting with intermittent diarrhea, abdominal pain, decreased appetite and about 15 to 20 pound unintentional weight loss in the last 2 months.  She had CT abdomen and pelvis on 7/12 that showed multiple liver lesions, wall thickening in transverse colon and large complex left renal cyst. She had GI appointment for 7/18 but pain acutely gotten worse and prompted her to come to ED.     CT chest/abdomen/pelvis showed LLL density extending to the pleural surface with interval change in morphology of some GG density in the left lower lobe solid and spiculated mass about 2.7 cm concerning for lung cancer, as well as multiple ill-defined hypodense liver lesions concerning for metastasis and 5.6 cm complex lobulated cyst in the midpole left kidney.  C. difficile negative.  GIP with EPEC.  Started on azithromycin. Diarrhea seems to have improved. Patient underwent liver biopsy on 05/23/2021.  Pathology pending.   New events last 24 hours / Subjective: Son at bedside states that patient received Ativan prior to her MRI last night, patient did not tolerate Ativan very well mentally.  This morning, patient has no new complaints, oral intake remains very poor, ate 1 bite of rice crispy treat this morning.  Still awaiting pathology report to guide next steps.  Assessment & Plan:   Principal Problem:   Abdominal mass Active Problems:   Hypertension   Hyperlipidemia   Coronary artery calcification seen on CAT scan   Abdominal pain   Anxiety and depression   Malnutrition of moderate degree   LLL nodule/mass/multiple ill-defined hypodense liver lesions -CT shows interval change in morphology of previously noted 2.7 cm GG density in LLL  that appears more solid and spiculated concerning for lung cancer, and multiple liver lesions concerning for metastatic disease. -S/p liver biopsy on 05/23/2021.  Pathology pending   Decreased appetite/oral intake/unintentional weight loss -Likely due to the above.  Appetite remains poor.  Prealbumin 7.6. -Continue regular diet and supplements -Continue multivitamin  -Started Marinol 2.5 mg on 7/23 -Patient is very hesitant about feeding tube, at least until liver pathology results   Infectious diarrhea due to EPEC -Completed 3 days of azithromycin on 7/21.  C. difficile negative.  Resolved.  Elevated lipase -No radiologic evidence of pancreatitis.  Resolved. -GI signed off.   Large complex complex lobulated left renal cyst -Follow up with MRI showed findings that favor a Bosniak category II cyst in the lower pole of the LEFT kidney.    Primary hyperthyroidism -TSH low at 0.014.  Free T4 elevated to 2.54.  Could be contributing to her anorexia -Continue Inderal 40 mg 3 times daily -Needs outpatient follow-up with endocrinology -Hold off methimazole in case she needs RAI study outpatient.   Sinus tachycardia with PACs -Continue inderal to 40 mg 3 times daily   Essential hypertension -Continue home amlodipine and Avapro   Hyperlipidemia -Continue home statin   Ascending aortic aneurysm -3.8 cm  on CT angio chest in 2017 -Follow up    DVT prophylaxis:  enoxaparin (LOVENOX) injection 40 mg Start: 05/23/21 2000  Code Status:     Code Status Orders  (From admission, onward)           Start     Ordered   05/19/21  [id number]  Full code  Continuous        05/19/21 0025           Code Status History     Date Active Date Inactive Code Status Order ID Comments User Context   03/01/2014 1228 03/02/2014 1311 Full Code [id number]  Jodi Marble, MD Inpatient      Family Communication: Son and daughter at bedside Disposition Plan:  Status is: Inpatient  Remains  inpatient appropriate because:IV treatments appropriate due to intensity of illness or inability to take PO and Inpatient level of care appropriate due to severity of illness  Dispo: The patient is from: Home              Anticipated d/c is to: Home              Patient currently is not medically stable to d/c.   Difficult to place patient No      Antimicrobials:  Anti-infectives (From admission, onward)    Start     Dose/Rate Route Frequency Ordered Stop   05/20/21 1700  azithromycin (ZITHROMAX) 500 mg in sodium chloride 0.9 % 250 mL IVPB        500 mg 250 mL/hr over 60 Minutes Intravenous Daily 05/20/21 1607 05/22/21 1045        Objective: Vitals:   05/27/21 1421 05/27/21 1741 05/27/21 2019 05/28/21 0540  BP: 110/77 119/87 131/76 (!) 134/48  Pulse: (!) 55 73 73 (!) 47  Resp: '18  20 18  '$ Temp: 97.8 F (36.6 C)  97.9 F (36.6 C) 99.1 F (37.3 C)  TempSrc: Oral  Oral Oral  SpO2: 97%  93% (!) 89%  Weight:      Height:        Intake/Output Summary (Last 24 hours) at 05/28/2021 1333 Last data filed at 05/28/2021 0600 Gross per 24 hour  Intake 1817.75 ml  Output --  Net 1817.75 ml   Filed Weights   05/19/21 0042 05/27/21 0500  Weight: 62.6 kg 65.1 kg    Examination:  General exam: Appears calm and comfortable  Respiratory system: Clear to auscultation. Respiratory effort normal. No respiratory distress. No conversational dyspnea.  Cardiovascular system: S1 & S2 heard, RRR. No murmurs. No pedal edema. Gastrointestinal system: Abdomen is nondistended, soft and nontender. Normal bowel sounds heard. Central nervous system: Alert and oriented. No focal neurological deficits. Speech clear.  Extremities: Symmetric in appearance  Skin: No rashes, lesions or ulcers on exposed skin  Psychiatry: Judgement and insight appear normal. Mood & affect appropriate.   Data Reviewed: I have personally reviewed following labs and imaging studies  CBC: Recent Labs  Lab  05/22/21 0422 05/23/21 0402 05/24/21 0457 05/28/21 0529  WBC 6.3 6.7  --  7.9  HGB 11.9* 11.5* 12.0 11.2*  HCT 35.1* 34.2* 35.3* 32.9*  MCV 92.1 92.9  --  92.2  PLT 255 257  --  [id number]   Basic Metabolic Panel: Recent Labs  Lab 05/22/21 0422 05/23/21 0402 05/24/21 0457 05/26/21 0504 05/28/21 0529  NA 143 143 141  --  144  K 3.6 3.5 3.8  --  3.5  CL 115* 116* 112*  --  111  CO2 20* 20* 20*  --  20*  GLUCOSE 94 94 80  --  97  BUN '12 12 11  '$ --  11  CREATININE 0.48 0.56 0.51 0.61 0.52  CALCIUM 8.7* 8.4* 8.9  --  8.9  MG 1.6* 1.8 2.0  --  1.7  PHOS 2.9  3.0 3.5  --  3.5   GFR: Estimated Creatinine Clearance: 50.9 mL/min (by C-G formula based on SCr of 0.52 mg/dL). Liver Function Tests: Recent Labs  Lab 05/22/21 0422 05/23/21 0402 05/24/21 0457 05/28/21 0529  AST  --  41  --   --   ALT  --  27  --   --   ALKPHOS  --  121  --   --   BILITOT  --  0.7  --   --   PROT  --  5.5*  --   --   ALBUMIN 2.8* 2.9* 3.0* 2.7*   No results for input(s): LIPASE, AMYLASE in the last 168 hours. Recent Labs  Lab 05/23/21 0402  AMMONIA 30   Coagulation Profile: No results for input(s): INR, PROTIME in the last 168 hours. Cardiac Enzymes: No results for input(s): CKTOTAL, CKMB, CKMBINDEX, TROPONINI in the last 168 hours. BNP (last 3 results) No results for input(s): PROBNP in the last 8760 hours. HbA1C: No results for input(s): HGBA1C in the last 72 hours. CBG: No results for input(s): GLUCAP in the last 168 hours. Lipid Profile: No results for input(s): CHOL, HDL, LDLCALC, TRIG, CHOLHDL, LDLDIRECT in the last 72 hours. Thyroid Function Tests: No results for input(s): TSH, T4TOTAL, FREET4, T3FREE, THYROIDAB in the last 72 hours. Anemia Panel: No results for input(s): VITAMINB12, FOLATE, FERRITIN, TIBC, IRON, RETICCTPCT in the last 72 hours. Sepsis Labs: No results for input(s): PROCALCITON, LATICACIDVEN in the last 168 hours.  Recent Results (from the past 240 hour(s))  Resp  Panel by RT-PCR (Flu A&B, Covid) Nasopharyngeal Swab     Status: None   Collection Time: 05/18/21  8:21 PM   Specimen: Nasopharyngeal Swab; Nasopharyngeal(NP) swabs in vial transport medium  Result Value Ref Range Status   SARS Coronavirus 2 by RT PCR NEGATIVE NEGATIVE Final    Comment: (NOTE) SARS-CoV-2 target nucleic acids are NOT DETECTED.  The SARS-CoV-2 RNA is generally detectable in upper respiratory specimens during the acute phase of infection. The lowest concentration of SARS-CoV-2 viral copies this assay can detect is 138 copies/mL. A negative result does not preclude SARS-Cov-2 infection and should not be used as the sole basis for treatment or other patient management decisions. A negative result may occur with  improper specimen collection/handling, submission of specimen other than nasopharyngeal swab, presence of viral mutation(s) within the areas targeted by this assay, and inadequate number of viral copies(<138 copies/mL). A negative result must be combined with clinical observations, patient history, and epidemiological information. The expected result is Negative.  Fact Sheet for Patients:  EntrepreneurPulse.com.au  Fact Sheet for Healthcare Providers:  IncredibleEmployment.be  This test is no t yet approved or cleared by the [place] FDA and  has been authorized for detection and/or diagnosis of SARS-CoV-2 by FDA under an Emergency Use Authorization (EUA). This EUA will remain  in effect (meaning this test can be used) for the duration of the COVID-19 declaration under Section 564(b)(1) of the Act, 21 U.S.C.section 360bbb-3(b)(1), unless the authorization is terminated  or revoked sooner.       Influenza A by PCR NEGATIVE NEGATIVE Final   Influenza B by PCR NEGATIVE NEGATIVE Final    Comment: (NOTE) The Xpert Xpress SARS-CoV-2/FLU/RSV plus assay is intended as an aid in the diagnosis of influenza from Nasopharyngeal  swab specimens and should not be used as a sole basis for treatment. Nasal washings and aspirates are unacceptable for Xpert Xpress SARS-CoV-2/FLU/RSV testing.  Fact Sheet for Patients:  EntrepreneurPulse.com.au  Fact Sheet for Healthcare Providers: IncredibleEmployment.be  This test is not yet approved or cleared by the [place] FDA and has been authorized for detection and/or diagnosis of SARS-CoV-2 by FDA under an Emergency Use Authorization (EUA). This EUA will remain in effect (meaning this test can be used) for the duration of the COVID-19 declaration under Section 564(b)(1) of the Act, 21 U.S.C. section 360bbb-3(b)(1), unless the authorization is terminated or revoked.  Performed at [facility], [place] [address], [place], [place] [address]   Gastrointestinal Panel by PCR , Stool     Status: Abnormal   Collection Time: 05/19/21 11:31 AM   Specimen: Stool  Result Value Ref Range Status   Campylobacter species NOT DETECTED NOT DETECTED Final   Plesimonas shigelloides NOT DETECTED NOT DETECTED Final   Salmonella species NOT DETECTED NOT DETECTED Final   Yersinia enterocolitica NOT DETECTED NOT DETECTED Final   Vibrio species NOT DETECTED NOT DETECTED Final   Vibrio cholerae NOT DETECTED NOT DETECTED Final   Enteroaggregative E coli (EAEC) NOT DETECTED NOT DETECTED Final   Enteropathogenic E coli (EPEC) DETECTED (A) NOT DETECTED Final    Comment: Baldwin [place], RN AT [id number] ON 05/20/21 BY [facility]   Enterotoxigenic E coli (ETEC) NOT DETECTED NOT DETECTED Final   Shiga like toxin producing E coli (STEC) NOT DETECTED NOT DETECTED Final   Shigella/Enteroinvasive E coli (EIEC) NOT DETECTED NOT DETECTED Final   Cryptosporidium NOT DETECTED NOT DETECTED Final   Cyclospora cayetanensis NOT DETECTED NOT DETECTED Final   Entamoeba histolytica NOT DETECTED NOT DETECTED Final   Giardia lamblia NOT DETECTED NOT DETECTED Final    Adenovirus F40/41 NOT DETECTED NOT DETECTED Final   Astrovirus NOT DETECTED NOT DETECTED Final   Norovirus GI/GII NOT DETECTED NOT DETECTED Final   Rotavirus A NOT DETECTED NOT DETECTED Final   Sapovirus (I, II, IV, and V) NOT DETECTED NOT DETECTED Final    Comment: Performed at [facility], [address] [place], [place] [address]  C Difficile Quick Screen w PCR reflex     Status: None   Collection Time: 05/19/21 11:31 AM   Specimen: Stool  Result Value Ref Range Status   C Diff antigen NEGATIVE NEGATIVE Final   C Diff toxin NEGATIVE NEGATIVE Final   C Diff interpretation No C. difficile detected.  Final    Comment: Performed at [facility], [place] [address], [place], [place] [address]      Radiology Studies: MR ABDOMEN W WO CONTRAST  Result Date: 05/27/2021 CLINICAL DATA:  Follow-up renal cyst/liver masses. EXAM: MRI ABDOMEN WITHOUT AND WITH CONTRAST TECHNIQUE: Multiplanar multisequence MR imaging of the abdomen was performed both before and after the administration of intravenous contrast. CONTRAST:  10m GADAVIST GADOBUTROL 1 MMOL/ML IV SOLN COMPARISON:  Comparison made with CT of the chest, abdomen and pelvis of May 18, 2021. FINDINGS: Lower chest: LEFT lower lobe nodularity not as well demonstrated as on the recent CT of the chest, abdomen and pelvis. No effusion. Hepatobiliary: Multiple hepatic lesions. Diffuse abnormal signal in the LEFT hepatic lobe. Greater than 30 lesions in the liver. Most of these in the range of cm but again with confluent disease throughout the LEFT hepatic lobe lateral segment. These are best seen on diffusion and T2 weighted images as there is motion artifact associated with the postcontrast images. (Image 56/8) 14 mm lesion in the posterior RIGHT hepatic lobe. Multiple lesions seen on image 45 of series 8 largest approximately  a cm approximately 14-15 lesions on that single image. No pericholecystic stranding or biliary duct  dilation. Pancreas: No signs of pancreatic inflammation or ductal dilation. No visible lesion. Imaged compromised by motion. Spleen:  Spleen normal size and contour. Adrenals/Urinary Tract:  Adrenal glands are normal. Cysts are present in the bilateral kidneys. Largest in the LEFT posterior lower pole with thin nonenhancing septations measuring approximately 4.5 x 3.8 x 4.4 cm. Smaller cysts elsewhere in the bilateral kidneys. Stomach/Bowel: No acute gastrointestinal process to the extent evaluated on abdominal MRI. Vascular/Lymphatic: Vascular structures without dilation. There is no gastrohepatic or hepatoduodenal ligament lymphadenopathy. No retroperitoneal or mesenteric lymphadenopathy. Other:  No ascites. Musculoskeletal: Multifocal bone lesions with all visualized levels of the spine showing restricted diffusion nearly replacing the marrow in multiple levels of the spine best seen on coronal images. Also involving the visualized pelvis nearly diffuse involvement of the iliac bones posteriorly IMPRESSION: 1. Signs of multiple hepatic metastatic lesions with confluent disease in the LEFT hepatic lobe and multiple smaller lesions in the RIGHT hepatic lobe as described. 2. Diffuse bony metastatic disease throughout the spine and visualized pelvis. 3. Pulmonary findings better seen on recent chest abdomen pelvis CT. 4. Findings that favor a Bosniak category II cyst in the lower pole of the LEFT kidney. Given motion artifact on the current study would suggest attention on subsequent imaging. No signs of enhancement are seen on motion degraded images. Electronically Signed   By: Zetta Bills M.D.   On: 05/27/2021 22:40      Scheduled Meds:  (feeding supplement) PROSource Plus  30 mL Oral TID BM   amLODipine  5 mg Oral Daily   atorvastatin  40 mg Oral Daily   citalopram  10 mg Oral Daily   clopidogrel  75 mg Oral Daily   dronabinol  2.5 mg Oral BID AC   enoxaparin (LOVENOX) injection  40 mg Subcutaneous  Q24H   irbesartan  150 mg Oral Daily   mirtazapine  15 mg Oral QHS   multivitamin with minerals  1 tablet Oral Daily   pantoprazole  40 mg Oral BID   propranolol  40 mg Oral TID   Continuous Infusions:  dextrose 5 % and 0.45 % NaCl with KCl 20 mEq/L 75 mL/hr at 05/27/21 1442     LOS: 10 days      Time spent: 30 minutes   Dessa Phi, DO [facility] 05/28/2021, 1:33 PM   Available via Epic secure chat 7am-7pm After these hours, please refer to coverage provider listed on [email or web address]

## 2021-05-29 ENCOUNTER — Telehealth: Payer: Self-pay | Admitting: Internal Medicine

## 2021-05-29 DIAGNOSIS — R1901 Right upper quadrant abdominal swelling, mass and lump: Secondary | ICD-10-CM | POA: Diagnosis not present

## 2021-05-29 LAB — BASIC METABOLIC PANEL
Anion gap: 5 (ref 5–15)
BUN: 11 mg/dL (ref 8–23)
CO2: 24 mmol/L (ref 22–32)
Calcium: 8.2 mg/dL — ABNORMAL LOW (ref 8.9–10.3)
Chloride: 113 mmol/L — ABNORMAL HIGH (ref 98–111)
Creatinine, Ser: 0.52 mg/dL (ref 0.44–1.00)
GFR, Estimated: >60 mL/min (ref >60)
Glucose, Bld: 121 mg/dL — ABNORMAL HIGH (ref 70–99)
Potassium: 3.5 mmol/L (ref 3.5–5.1)
Sodium: 142 mmol/L (ref 135–145)

## 2021-05-29 LAB — MAGNESIUM: Magnesium: 1.6 mg/dL — ABNORMAL LOW (ref 1.7–2.4)

## 2021-05-29 MED ORDER — MAGNESIUM SULFATE 2 GM/50ML IV SOLN
2.0000 g | Freq: Once | INTRAVENOUS | Status: AC
  Administered 2021-05-29: 2 g via INTRAVENOUS
  Filled 2021-05-29: qty 50

## 2021-05-29 NOTE — Progress Notes (Addendum)
PROGRESS NOTE    KAYRON SCHAAN  [id number] DOB: May 28, 1935 DOA: 05/18/2021 PCP: Aletha Halim., PA-C     Brief Narrative:  Karla Chavez is an 86 year old F with PMH of HTN, HLD, vertigo, LLL nodule, osteoarthritis, GERD, appendectomy and aortic aneurysm presenting with intermittent diarrhea, abdominal pain, decreased appetite and about 15 to 20 pound unintentional weight loss in the last 2 months.  She had CT abdomen and pelvis on 7/12 that showed multiple liver lesions, wall thickening in transverse colon and large complex left renal cyst. She had GI appointment for 7/18 but pain acutely gotten worse and prompted her to come to ED.     CT chest/abdomen/pelvis showed LLL density extending to the pleural surface with interval change in morphology of some GG density in the left lower lobe solid and spiculated mass about 2.7 cm concerning for lung cancer, as well as multiple ill-defined hypodense liver lesions concerning for metastasis and 5.6 cm complex lobulated cyst in the midpole left kidney.  C. difficile negative.  GIP with EPEC.  Started on azithromycin. Diarrhea seems to have improved. Patient underwent liver biopsy on 05/23/2021.  Pathology pending.   New events last 24 hours / Subjective: No family at bedside today, could not reach son via phone.  Patient states that she ate yesterday, does not recall what she ate.  Did not eat breakfast yet.  Pathology still pending, spoke with pathologist, should have results back today or tomorrow.  Tried to discuss with her regarding cortrak for temporary tube feeding, patient was very overwhelmed and noncommittal.  Assessment & Plan:   Principal Problem:   Abdominal mass Active Problems:   Hypertension   Hyperlipidemia   Coronary artery calcification seen on CAT scan   Abdominal pain   Anxiety and depression   Malnutrition of moderate degree   LLL nodule/mass/multiple ill-defined hypodense liver lesions -CT shows interval  change in morphology of previously noted 2.7 cm GG density in LLL that appears more solid and spiculated concerning for lung cancer, and multiple liver lesions concerning for metastatic disease. -S/p liver biopsy on 05/23/2021.  Pathology pending   Decreased appetite/oral intake/unintentional weight loss -Likely due to the above.  Appetite remains poor.  Prealbumin 7.6. -Continue regular diet and supplements -Continue multivitamin  -Started Marinol 2.5 mg on 7/23 -Patient is very hesitant about feeding tube, at least until liver pathology results   Infectious diarrhea due to EPEC -Completed 3 days of azithromycin on 7/21.  C. difficile negative.  Resolved.  Elevated lipase -No radiologic evidence of pancreatitis.   -GI signed off.   Large complex complex lobulated left renal cyst -Follow up with MRI showed findings that favor a Bosniak category II cyst in the lower pole of the LEFT kidney.    Primary hyperthyroidism -TSH low at 0.014.  Free T4 elevated to 2.54.  Could be contributing to her anorexia -Continue Inderal 40 mg 3 times daily -Needs outpatient follow-up with endocrinology -Hold off methimazole in case she needs RAI study outpatient.   Sinus tachycardia with PACs -Continue inderal to 40 mg 3 times daily   Essential hypertension -Continue home amlodipine and Avapro   Hyperlipidemia -Continue home statin   Ascending aortic aneurysm -3.8 cm  on CT angio chest in 2017 -Follow up   Hypomagnesemia -Replace    DVT prophylaxis:  enoxaparin (LOVENOX) injection 40 mg Start: 05/23/21 2000  Code Status:     Code Status Orders  (From admission, onward)  Start     Ordered   05/19/21 0026  Full code  Continuous        05/19/21 0025           Code Status History     Date Active Date Inactive Code Status Order ID Comments User Context   03/01/2014 1228 03/02/2014 1311 Full Code [id number]  Jodi Marble, MD Inpatient      Family Communication:  Spoke with son over the phone. Family continue to contemplate cortrak option, they do not seem very interested in PEG at this time. Wondering if they want to take her home and have outpatient follow up with oncology.  Disposition Plan:  Status is: Inpatient  Remains inpatient appropriate because:IV treatments appropriate due to intensity of illness or inability to take PO and Inpatient level of care appropriate due to severity of illness  Dispo: The patient is from: Home              Anticipated d/c is to: Home              Patient currently is not medically stable to d/c.   Difficult to place patient No      Antimicrobials:  Anti-infectives (From admission, onward)    Start     Dose/Rate Route Frequency Ordered Stop   05/20/21 1700  azithromycin (ZITHROMAX) 500 mg in sodium chloride 0.9 % 250 mL IVPB        500 mg 250 mL/hr over 60 Minutes Intravenous Daily 05/20/21 1607 05/22/21 1045        Objective: Vitals:   05/28/21 1742 05/28/21 1942 05/29/21 0403 05/29/21 0500  BP: (!) 129/50 124/67 (!) 133/59   Pulse: 72 82 75   Resp: 20 (!) 21 18   Temp: 98.4 F (36.9 C) 97.9 F (36.6 C) 99 F (37.2 C)   TempSrc: Oral Oral Oral   SpO2: 92% 92% 91%   Weight:    64.6 kg  Height:        Intake/Output Summary (Last 24 hours) at 05/29/2021 1308 Last data filed at 05/28/2021 1437 Gross per 24 hour  Intake 0 ml  Output --  Net 0 ml    Filed Weights   05/19/21 0042 05/27/21 0500 05/29/21 0500  Weight: 62.6 kg 65.1 kg 64.6 kg    Examination: General exam: Appears calm and comfortable  Respiratory system: Clear to auscultation. Respiratory effort normal. Cardiovascular system: S1 & S2 heard, RRR. No pedal edema. Gastrointestinal system: Abdomen is nondistended, soft and nontender. Normal bowel sounds heard. Central nervous system: Alert and oriented. Non focal exam. Speech clear  Extremities: Symmetric in appearance bilaterally  Skin: No rashes, lesions or ulcers on  exposed skin  Psychiatry: Judgement and insight appear stable. Mood & affect appropriate.     Data Reviewed: I have personally reviewed following labs and imaging studies  CBC: Recent Labs  Lab 05/23/21 0402 05/24/21 0457 05/28/21 0529  WBC 6.7  --  7.9  HGB 11.5* 12.0 11.2*  HCT 34.2* 35.3* 32.9*  MCV 92.9  --  92.2  PLT 257  --  [id number]    Basic Metabolic Panel: Recent Labs  Lab 05/23/21 0402 05/24/21 0457 05/26/21 0504 05/28/21 0529 05/29/21 0540  NA 143 141  --  144 142  K 3.5 3.8  --  3.5 3.5  CL 116* 112*  --  111 113*  CO2 20* 20*  --  20* 24  GLUCOSE 94 80  --  97 121*  BUN 12  11  --  11 11  CREATININE 0.56 0.51 0.61 0.52 0.52  CALCIUM 8.4* 8.9  --  8.9 8.2*  MG 1.8 2.0  --  1.7 1.6*  PHOS 3.0 3.5  --  3.5  --     GFR: Estimated Creatinine Clearance: 50.9 mL/min (by C-G formula based on SCr of 0.52 mg/dL). Liver Function Tests: Recent Labs  Lab 05/23/21 0402 05/24/21 0457 05/28/21 0529  AST 41  --   --   ALT 27  --   --   ALKPHOS 121  --   --   BILITOT 0.7  --   --   PROT 5.5*  --   --   ALBUMIN 2.9* 3.0* 2.7*    No results for input(s): LIPASE, AMYLASE in the last 168 hours. Recent Labs  Lab 05/23/21 0402  AMMONIA 30    Coagulation Profile: No results for input(s): INR, PROTIME in the last 168 hours. Cardiac Enzymes: No results for input(s): CKTOTAL, CKMB, CKMBINDEX, TROPONINI in the last 168 hours. BNP (last 3 results) No results for input(s): PROBNP in the last 8760 hours. HbA1C: No results for input(s): HGBA1C in the last 72 hours. CBG: No results for input(s): GLUCAP in the last 168 hours. Lipid Profile: No results for input(s): CHOL, HDL, LDLCALC, TRIG, CHOLHDL, LDLDIRECT in the last 72 hours. Thyroid Function Tests: No results for input(s): TSH, T4TOTAL, FREET4, T3FREE, THYROIDAB in the last 72 hours. Anemia Panel: No results for input(s): VITAMINB12, FOLATE, FERRITIN, TIBC, IRON, RETICCTPCT in the last 72 hours. Sepsis  Labs: No results for input(s): PROCALCITON, LATICACIDVEN in the last 168 hours.  No results found for this or any previous visit (from the past 240 hour(s)).     Radiology Studies: MR ABDOMEN W WO CONTRAST  Result Date: 05/27/2021 CLINICAL DATA:  Follow-up renal cyst/liver masses. EXAM: MRI ABDOMEN WITHOUT AND WITH CONTRAST TECHNIQUE: Multiplanar multisequence MR imaging of the abdomen was performed both before and after the administration of intravenous contrast. CONTRAST:  40m GADAVIST GADOBUTROL 1 MMOL/ML IV SOLN COMPARISON:  Comparison made with CT of the chest, abdomen and pelvis of May 18, 2021. FINDINGS: Lower chest: LEFT lower lobe nodularity not as well demonstrated as on the recent CT of the chest, abdomen and pelvis. No effusion. Hepatobiliary: Multiple hepatic lesions. Diffuse abnormal signal in the LEFT hepatic lobe. Greater than 30 lesions in the liver. Most of these in the range of cm but again with confluent disease throughout the LEFT hepatic lobe lateral segment. These are best seen on diffusion and T2 weighted images as there is motion artifact associated with the postcontrast images. (Image 56/8) 14 mm lesion in the posterior RIGHT hepatic lobe. Multiple lesions seen on image 45 of series 8 largest approximately a cm approximately 14-15 lesions on that single image. No pericholecystic stranding or biliary duct dilation. Pancreas: No signs of pancreatic inflammation or ductal dilation. No visible lesion. Imaged compromised by motion. Spleen:  Spleen normal size and contour. Adrenals/Urinary Tract:  Adrenal glands are normal. Cysts are present in the bilateral kidneys. Largest in the LEFT posterior lower pole with thin nonenhancing septations measuring approximately 4.5 x 3.8 x 4.4 cm. Smaller cysts elsewhere in the bilateral kidneys. Stomach/Bowel: No acute gastrointestinal process to the extent evaluated on abdominal MRI. Vascular/Lymphatic: Vascular structures without dilation. There  is no gastrohepatic or hepatoduodenal ligament lymphadenopathy. No retroperitoneal or mesenteric lymphadenopathy. Other:  No ascites. Musculoskeletal: Multifocal bone lesions with all visualized levels of the spine showing restricted diffusion nearly  replacing the marrow in multiple levels of the spine best seen on coronal images. Also involving the visualized pelvis nearly diffuse involvement of the iliac bones posteriorly IMPRESSION: 1. Signs of multiple hepatic metastatic lesions with confluent disease in the LEFT hepatic lobe and multiple smaller lesions in the RIGHT hepatic lobe as described. 2. Diffuse bony metastatic disease throughout the spine and visualized pelvis. 3. Pulmonary findings better seen on recent chest abdomen pelvis CT. 4. Findings that favor a Bosniak category II cyst in the lower pole of the LEFT kidney. Given motion artifact on the current study would suggest attention on subsequent imaging. No signs of enhancement are seen on motion degraded images. Electronically Signed   By: Zetta Bills M.D.   On: 05/27/2021 22:40      Scheduled Meds:  (feeding supplement) PROSource Plus  30 mL Oral TID BM   amLODipine  5 mg Oral Daily   atorvastatin  40 mg Oral Daily   citalopram  10 mg Oral Daily   clopidogrel  75 mg Oral Daily   dronabinol  2.5 mg Oral BID AC   enoxaparin (LOVENOX) injection  40 mg Subcutaneous Q24H   irbesartan  150 mg Oral Daily   multivitamin with minerals  1 tablet Oral Daily   pantoprazole  40 mg Oral BID   propranolol  40 mg Oral TID   Continuous Infusions:  dextrose 5 % and 0.45 % NaCl with KCl 20 mEq/L 75 mL/hr at 05/29/21 0157     LOS: 11 days      Time spent: 25 minutes   Dessa Phi, DO [facility] 05/29/2021, 1:08 PM   Available via Epic secure chat 7am-7pm After these hours, please refer to coverage provider listed on [email or web address]

## 2021-05-29 NOTE — Telephone Encounter (Signed)
Ms. Hillery has been rescheduled to see Dr. Julien Nordmann on 8/9 at 2:15pm w/labs at 1:45pm. Pt is currently in the hospital. Letter mailed.

## 2021-05-29 NOTE — Care Management Important Message (Signed)
Important Message  Patient Details IM Letter given to the Patient. Name: Karla Chavez MRN: [id number] Date of Birth: 1934-12-16   Medicare Important Message Given:  Yes     Kerin Salen 05/29/2021, 10:29 AM

## 2021-05-30 ENCOUNTER — Encounter (HOSPITAL_COMMUNITY): Payer: Self-pay | Admitting: Internal Medicine

## 2021-05-30 DIAGNOSIS — I712 Thoracic aortic aneurysm, without rupture: Secondary | ICD-10-CM

## 2021-05-30 DIAGNOSIS — N281 Cyst of kidney, acquired: Secondary | ICD-10-CM | POA: Diagnosis not present

## 2021-05-30 DIAGNOSIS — M899 Disorder of bone, unspecified: Secondary | ICD-10-CM

## 2021-05-30 DIAGNOSIS — C7A090 Malignant carcinoid tumor of the bronchus and lung: Principal | ICD-10-CM

## 2021-05-30 DIAGNOSIS — C7B02 Secondary carcinoid tumors of liver: Secondary | ICD-10-CM

## 2021-05-30 DIAGNOSIS — R1901 Right upper quadrant abdominal swelling, mass and lump: Secondary | ICD-10-CM | POA: Diagnosis not present

## 2021-05-30 DIAGNOSIS — R630 Anorexia: Secondary | ICD-10-CM

## 2021-05-30 DIAGNOSIS — E785 Hyperlipidemia, unspecified: Secondary | ICD-10-CM

## 2021-05-30 LAB — BASIC METABOLIC PANEL
Anion gap: 8 (ref 5–15)
BUN: 10 mg/dL (ref 8–23)
CO2: 21 mmol/L — ABNORMAL LOW (ref 22–32)
Calcium: 8.1 mg/dL — ABNORMAL LOW (ref 8.9–10.3)
Chloride: 110 mmol/L (ref 98–111)
Creatinine, Ser: 0.5 mg/dL (ref 0.44–1.00)
GFR, Estimated: >60 mL/min (ref >60)
Glucose, Bld: 130 mg/dL — ABNORMAL HIGH (ref 70–99)
Potassium: 3.7 mmol/L (ref 3.5–5.1)
Sodium: 139 mmol/L (ref 135–145)

## 2021-05-30 LAB — SURGICAL PATHOLOGY

## 2021-05-30 LAB — MAGNESIUM: Magnesium: 1.8 mg/dL (ref 1.7–2.4)

## 2021-05-30 MED ORDER — LANREOTIDE ACETATE 120 MG/0.5ML ~~LOC~~ SOLN
120.0000 mg | Freq: Once | SUBCUTANEOUS | Status: AC
  Administered 2021-05-31: 120 mg via SUBCUTANEOUS
  Filled 2021-05-30: qty 120

## 2021-05-30 NOTE — Consult Note (Addendum)
[place]  [phone] [phone]) [phone]   [place]  Referral MD: Dr. Dessa Phi  Reason for Referral: Left lower lobe lung mass, liver lesions, bone lesions  HPI: Ms. Chisenhall is an 86 year old female with a past medical history significant for GERD, hypertension, hyperlipidemia, history of vertigo, aortic aneurysm, osteoarthritis.  The patient presented to the emergency department with abdominal pain and diarrhea.  The patient was having persistent diarrhea on and off for about 2 months.  Diarrhea occurs every time she eats.  During this time period, she lost a fair amount of weight.  She has had persistent abdominal pain during this time.  She was seen by her PCP and sent for GI referral.  A CT scan was performed on 05/13/2021 at [facility] which showed multiple low-attenuation liver lesions suggesting metastatic disease.  The plan was for the patient to have a liver biopsy performed as an outpatient but due to worsening symptoms, she sent into the emergency department.  On admission, she had a CT of the head without contrast which showed no acute intracranial abnormality.  She also had a CT of the chest with contrast as well as CT of the abdomen/pelvis with contrast which showed a bandlike density in the left lower lobe that extends to the pleural surface suggestive of scarring, the previously noted groundglass density in the left lower lobe associated with the linear density now appears more solid and spiculated measuring up to 2.7 cm and concerning for lung carcinoma, multiple ill-defined hypodense liver lesions concerning for metastatic disease.  She also had an MRI of the abdomen with and without contrast which showed signs of multiple hepatic metastatic lesions with confluent disease in the left hepatic lobe and multiple smaller lesions in the right hepatic lobe, diffuse bony metastatic disease throughout the spine and visualized  pelvis.  The patient underwent a liver biopsy by interventional radiology on 05/23/2021 and results are currently pending at this time.  I spoke with pathology who anticipates results will be available later today. She has continued to have a very poor appetite and there have been discussions about placing cortrak.  However, the patient is still deciding and wishes to speak with oncology and palliative care before making final decisions.  I met with the patient in her hospital room.  No family at the bedside.  She is quite fatigued.  She reports that she has a poor appetite and has lost weight recently.  She reports intermittent abdominal pain but overall her pain is much better since being at the hospital.  She continues to have loose stools.  Reports some nausea and vomiting today.  Denies headaches, dizziness, chest pain, cough, hemoptysis.  Reports some mild shortness of breath.  No bleeding reported.  The patient is widowed and has 4 children who live locally.  She denies history of alcohol and tobacco use.  Family history significant for a mother with uterine cancer and an older sister who had lung cancer and subsequently had a "blood cancer."  Medical oncology was asked to see the patient to make recommendations regarding her abnormal CT scan findings.  Past Medical History:  Diagnosis Date   Adenomatous colon polyp 03/2010   Allergy    Aortic aneurysm ([place])    Arthritis    "joints" (03/01/2014)   Family history of anesthesia complication    pts sister has severe nausea and vomiting   GERD (gastroesophageal reflux disease)    History of blood transfusion  1958   "related to childbirth"   Hyperlipidemia    Hypertension    Near syncope    Palpitations    Pneumonia 06/2009   PONV (postoperative nausea and vomiting)    "this OR" (03/01/2014)   Vertigo    hx of occured on 01/01/14  :   Past Surgical History:  Procedure Laterality Date   APPENDECTOMY  1958   CARDIOVASCULAR STRESS TEST   01/09/2008   EF 82%   CERVICAL CONE BIOPSY  1970's   COLONOSCOPY     PANENDOSCOPY     PAROTIDECTOMY Left 03/01/2104   superficial with facial nerve preservation    PAROTIDECTOMY Left 03/01/2014   Procedure: LEFT SUPERFICIAL  VS TOTAL PAROTIDECTOMY;  Surgeon: Jodi Marble, MD;  Location: [facility];  Service: ENT;  Laterality: Left;   [place]   tumor in neck  2015  :   Current Facility-Administered Medications  Medication Dose Route Frequency Provider Last Rate Last Admin   (feeding supplement) PROSource Plus liquid 30 mL  30 mL Oral TID BM Gonfa, Taye T, MD   30 mL at 05/30/21 0919   acetaminophen (TYLENOL) tablet 650 mg  650 mg Oral Q6H PRN Elwyn Reach, MD   650 mg at 05/27/21 6270   Or   acetaminophen (TYLENOL) suppository 650 mg  650 mg Rectal Q6H PRN Elwyn Reach, MD       alum & mag hydroxide-simeth (MAALOX/MYLANTA) 200-200-20 MG/5ML suspension 30 mL  30 mL Oral Q8H PRN Gonfa, Taye T, MD       amLODipine (NORVASC) tablet 5 mg  5 mg Oral Daily Wendee Beavers T, MD   5 mg at 05/30/21 0918   atorvastatin (LIPITOR) tablet 40 mg  40 mg Oral Daily Wendee Beavers T, MD   40 mg at 05/30/21 0918   citalopram (CELEXA) tablet 10 mg  10 mg Oral Daily Wendee Beavers T, MD   10 mg at 05/30/21 3500   clopidogrel (PLAVIX) tablet 75 mg  75 mg Oral Daily Gonfa, Taye T, MD   75 mg at 05/30/21 0922   dextrose 5 % and 0.45 % NaCl with KCl 20 mEq/L infusion   Intravenous Continuous Mercy Riding, MD 75 mL/hr at 05/30/21 0554 New Bag at 05/30/21 0554   dronabinol (MARINOL) capsule 2.5 mg  2.5 mg Oral BID AC Gonfa, Taye T, MD   2.5 mg at 05/30/21 1145   enoxaparin (LOVENOX) injection 40 mg  40 mg Subcutaneous Q24H Wendee Beavers T, MD   40 mg at 05/29/21 2034   irbesartan (AVAPRO) tablet 150 mg  150 mg Oral Daily Wendee Beavers T, MD   150 mg at 05/30/21 0919   loperamide (IMODIUM) capsule 2 mg  2 mg Oral PRN Wendee Beavers T, MD   2 mg at 05/24/21 1703   multivitamin with minerals tablet 1 tablet  1 tablet  Oral Daily Wendee Beavers T, MD   1 tablet at 05/30/21 0918   ondansetron (ZOFRAN) tablet 4 mg  4 mg Oral Q6H PRN Elwyn Reach, MD   4 mg at 05/22/21 1631   Or   ondansetron (ZOFRAN) injection 4 mg  4 mg Intravenous Q6H PRN Elwyn Reach, MD       pantoprazole (PROTONIX) EC tablet 40 mg  40 mg Oral BID Wendee Beavers T, MD   40 mg at 05/30/21 0918   propranolol (INDERAL) tablet 40 mg  40 mg Oral TID Mercy Riding, MD  40 mg at 05/30/21 1941      Allergies  Allergen Reactions   Benazepril Hcl Hives    Per patient and Doctor believed medication caused Hives   :   Family History  Problem Relation Age of Onset   Hypertension Mother    Stroke Mother 1   Heart disease Father 29       massive MI   Heart disease Sister 57       CABG   Heart disease Brother 93       CABG  :   Social History   Socioeconomic History   Marital status: Married    Spouse name: Not on file   Number of children: 4   Years of education: Not on file   Highest education level: Not on file  Occupational History   Occupation: retired  Tobacco Use   Smoking status: Never   Smokeless tobacco: Never  Substance and Sexual Activity   Alcohol use: No   Drug use: No   Sexual activity: Never  Other Topics Concern   Not on file  Social History Narrative   Not on file   Social Determinants of Health   Financial Resource Strain: Not on file  Food Insecurity: Not on file  Transportation Needs: Not on file  Physical Activity: Not on file  Stress: Not on file  Social Connections: Not on file  Intimate Partner Violence: Not on file  :  Review of Systems: A comprehensive 14 point review of systems was negative except as noted in the HPI.  Exam: Patient Vitals for the past 24 hrs:  BP Temp Temp src Pulse Resp SpO2 Weight  05/30/21 0918 (!) 142/74 -- -- 72 -- -- --  05/30/21 0542 (!) 118/53 98.2 F (36.8 C) -- 65 18 90 % --  05/30/21 0500 -- -- -- -- -- -- 62.9 kg  05/29/21 2012 134/78 98.6 F  (37 C) -- 73 18 91 % --  05/29/21 1349 (!) 106/51 98.9 F (37.2 C) Oral 63 14 98 % --    General: Chronically ill-appearing female, laying in bed, no distress. Eyes:  no scleral icterus.   ENT:  There were no oropharyngeal lesions.   Lymphatics:  Negative cervical, supraclavicular or axillary adenopathy.   Respiratory: lungs were clear bilaterally without wheezing or crackles.   Cardiovascular:  Regular rate and rhythm, S1/S2, without murmur, rub or gallop.  There was no pedal edema.   GI:  abdomen was soft, flat, nontender, nondistended, without organomegaly.   Musculoskeletal: Strength symmetrical in the upper and lower extremities. Skin exam was without echymosis, petichae.   Neuro exam was nonfocal. Patient was alert and oriented.  Attention was good.      Lab Results  Component Value Date   WBC 7.9 05/28/2021   HGB 11.2 (L) 05/28/2021   HCT 32.9 (L) 05/28/2021   PLT 231 05/28/2021   GLUCOSE 130 (H) 05/30/2021   CHOL 159 07/18/2019   TRIG 136 07/18/2019   HDL 55 07/18/2019   LDLDIRECT 224.3 06/10/2011   LDLCALC 80 07/18/2019   ALT 27 05/23/2021   AST 41 05/23/2021   NA 139 05/30/2021   K 3.7 05/30/2021   CL 110 05/30/2021   CREATININE 0.50 05/30/2021   BUN 10 05/30/2021   CO2 21 (L) 05/30/2021    CT Head Wo Contrast  Result Date: 05/18/2021 CLINICAL DATA:  Abdomen pain diarrhea EXAM: CT HEAD WITHOUT CONTRAST TECHNIQUE: Contiguous axial images were obtained from the base of  the skull through the vertex without intravenous contrast. COMPARISON:  MRI brain 01/02/2014 FINDINGS: Brain: No acute territorial infarction, hemorrhage or intracranial mass. Moderate atrophy. Cerebellar atrophy. Nonenlarged ventricles. Patchy white matter hypodensity consistent with chronic small vessel ischemic change Vascular: No hyperdense vessels.  Carotid vascular calcification Skull: Normal. Negative for fracture or focal lesion. Sinuses/Orbits: No acute finding. Other: None IMPRESSION: 1. No  CT evidence for acute intracranial abnormality. 2. Cortical and cerebellar atrophy. Chronic small vessel ischemic changes of the white matter Electronically Signed   By: Donavan Foil M.D.   On: 05/18/2021 22:15   CT Chest W Contrast  Result Date: 05/18/2021 CLINICAL DATA:  GI issues poor p.o. intake abdomen pain EXAM: CT CHEST, ABDOMEN, AND PELVIS WITH CONTRAST TECHNIQUE: Multidetector CT imaging of the chest, abdomen and pelvis was performed following the standard protocol during bolus administration of intravenous contrast. CONTRAST:  35m OMNIPAQUE IOHEXOL 350 MG/ML SOLN COMPARISON:  MRI 01/08/2020, CT 12/02/2015, ultrasound 12/22/2005 FINDINGS: CT CHEST FINDINGS Cardiovascular: Mild aortic atherosclerosis. No aneurysm. Coronary vascular calcification. Normal cardiac size. No pericardial effusion Mediastinum/Nodes: Midline trachea. Subcentimeter left thyroid nodule, no further workup recommended based on age of patient and size of lesion. No suspicious adenopathy. Esophagus within normal limits Lungs/Pleura: Biapical pleural and parenchymal scarring. Bandlike density in the left lower lobe extending to the pleural surface as noted on previous and suggestive of scarring. Interval change in morphology of sub solid area in the superior left lower lobe, now appears more solid and spiculated, this measures 2.7 cm AP x 1.5 cm transverse by 2.4 cm craniocaudad. Given interval change in morphology, findings are concerning for lung carcinoma. Musculoskeletal: Sternum is intact.  No suspicious bone lesion. CT ABDOMEN PELVIS FINDINGS Hepatobiliary: Ill-defined multiple hypodense liver nodules. No calcified gallstone or biliary dilatation Pancreas: Unremarkable. No pancreatic ductal dilatation or surrounding inflammatory changes. Spleen: Normal in size without focal abnormality. Adrenals/Urinary Tract: Adrenal glands are normal. Kidneys show no hydronephrosis. Subcentimeter cortical hypodense renal lesions, too small to  further characterize. 5.6 by 4 by 4 cm complex lobulated cyst in the left kidney containing septations and calcification. Urinary bladder is unremarkable. Stomach/Bowel: The stomach is nonenlarged. No dilated small bowel. No acute bowel wall thickening. The appendix is not well visualized. Scattered diverticular disease. Vascular/Lymphatic: Moderate aortic atherosclerosis. No aneurysm. No suspicious nodes Reproductive: Uterus and bilateral adnexa are unremarkable. Other: Negative for free air or free fluid. Musculoskeletal: Degenerative changes of the spine. Small focal sclerosis at T12, possibly a small bone island. IMPRESSION: 1. Bandlike density in the left lower lobe that extends to the pleural surface, suggestive of scarring. Interval change in morphology of previously noted ground-glass density in the left lower lobe associated with the linear density, now appears more solid and spiculated, measuring up to 2.7 cm and concerning for lung carcinoma. Pulmonary consultation is recommended. 2. Multiple ill-defined hypodense liver lesions concerning for metastatic disease 3. 5.6 cm complex lobulated cyst mid pole left kidney. When the patient is clinically stable and able to follow directions and hold their breath (preferably as an outpatient) further evaluation with dedicated abdominal MRI should be considered. Electronically Signed   By: KDonavan FoilM.D.   On: 05/18/2021 22:36   MR ABDOMEN W WO CONTRAST  Result Date: 05/27/2021 CLINICAL DATA:  Follow-up renal cyst/liver masses. EXAM: MRI ABDOMEN WITHOUT AND WITH CONTRAST TECHNIQUE: Multiplanar multisequence MR imaging of the abdomen was performed both before and after the administration of intravenous contrast. CONTRAST:  753mGADAVIST GADOBUTROL 1 MMOL/ML IV  SOLN COMPARISON:  Comparison made with CT of the chest, abdomen and pelvis of May 18, 2021. FINDINGS: Lower chest: LEFT lower lobe nodularity not as well demonstrated as on the recent CT of the chest,  abdomen and pelvis. No effusion. Hepatobiliary: Multiple hepatic lesions. Diffuse abnormal signal in the LEFT hepatic lobe. Greater than 30 lesions in the liver. Most of these in the range of cm but again with confluent disease throughout the LEFT hepatic lobe lateral segment. These are best seen on diffusion and T2 weighted images as there is motion artifact associated with the postcontrast images. (Image 56/8) 14 mm lesion in the posterior RIGHT hepatic lobe. Multiple lesions seen on image 45 of series 8 largest approximately a cm approximately 14-15 lesions on that single image. No pericholecystic stranding or biliary duct dilation. Pancreas: No signs of pancreatic inflammation or ductal dilation. No visible lesion. Imaged compromised by motion. Spleen:  Spleen normal size and contour. Adrenals/Urinary Tract:  Adrenal glands are normal. Cysts are present in the bilateral kidneys. Largest in the LEFT posterior lower pole with thin nonenhancing septations measuring approximately 4.5 x 3.8 x 4.4 cm. Smaller cysts elsewhere in the bilateral kidneys. Stomach/Bowel: No acute gastrointestinal process to the extent evaluated on abdominal MRI. Vascular/Lymphatic: Vascular structures without dilation. There is no gastrohepatic or hepatoduodenal ligament lymphadenopathy. No retroperitoneal or mesenteric lymphadenopathy. Other:  No ascites. Musculoskeletal: Multifocal bone lesions with all visualized levels of the spine showing restricted diffusion nearly replacing the marrow in multiple levels of the spine best seen on coronal images. Also involving the visualized pelvis nearly diffuse involvement of the iliac bones posteriorly IMPRESSION: 1. Signs of multiple hepatic metastatic lesions with confluent disease in the LEFT hepatic lobe and multiple smaller lesions in the RIGHT hepatic lobe as described. 2. Diffuse bony metastatic disease throughout the spine and visualized pelvis. 3. Pulmonary findings better seen on recent  chest abdomen pelvis CT. 4. Findings that favor a Bosniak category II cyst in the lower pole of the LEFT kidney. Given motion artifact on the current study would suggest attention on subsequent imaging. No signs of enhancement are seen on motion degraded images. Electronically Signed   By: Zetta Bills M.D.   On: 05/27/2021 22:40   CT ABDOMEN PELVIS W CONTRAST  Result Date: 05/18/2021 CLINICAL DATA:  GI issues poor p.o. intake abdomen pain EXAM: CT CHEST, ABDOMEN, AND PELVIS WITH CONTRAST TECHNIQUE: Multidetector CT imaging of the chest, abdomen and pelvis was performed following the standard protocol during bolus administration of intravenous contrast. CONTRAST:  9m OMNIPAQUE IOHEXOL 350 MG/ML SOLN COMPARISON:  MRI 01/08/2020, CT 12/02/2015, ultrasound 12/22/2005 FINDINGS: CT CHEST FINDINGS Cardiovascular: Mild aortic atherosclerosis. No aneurysm. Coronary vascular calcification. Normal cardiac size. No pericardial effusion Mediastinum/Nodes: Midline trachea. Subcentimeter left thyroid nodule, no further workup recommended based on age of patient and size of lesion. No suspicious adenopathy. Esophagus within normal limits Lungs/Pleura: Biapical pleural and parenchymal scarring. Bandlike density in the left lower lobe extending to the pleural surface as noted on previous and suggestive of scarring. Interval change in morphology of sub solid area in the superior left lower lobe, now appears more solid and spiculated, this measures 2.7 cm AP x 1.5 cm transverse by 2.4 cm craniocaudad. Given interval change in morphology, findings are concerning for lung carcinoma. Musculoskeletal: Sternum is intact.  No suspicious bone lesion. CT ABDOMEN PELVIS FINDINGS Hepatobiliary: Ill-defined multiple hypodense liver nodules. No calcified gallstone or biliary dilatation Pancreas: Unremarkable. No pancreatic ductal dilatation or surrounding inflammatory changes.  Spleen: Normal in size without focal abnormality.  Adrenals/Urinary Tract: Adrenal glands are normal. Kidneys show no hydronephrosis. Subcentimeter cortical hypodense renal lesions, too small to further characterize. 5.6 by 4 by 4 cm complex lobulated cyst in the left kidney containing septations and calcification. Urinary bladder is unremarkable. Stomach/Bowel: The stomach is nonenlarged. No dilated small bowel. No acute bowel wall thickening. The appendix is not well visualized. Scattered diverticular disease. Vascular/Lymphatic: Moderate aortic atherosclerosis. No aneurysm. No suspicious nodes Reproductive: Uterus and bilateral adnexa are unremarkable. Other: Negative for free air or free fluid. Musculoskeletal: Degenerative changes of the spine. Small focal sclerosis at T12, possibly a small bone island. IMPRESSION: 1. Bandlike density in the left lower lobe that extends to the pleural surface, suggestive of scarring. Interval change in morphology of previously noted ground-glass density in the left lower lobe associated with the linear density, now appears more solid and spiculated, measuring up to 2.7 cm and concerning for lung carcinoma. Pulmonary consultation is recommended. 2. Multiple ill-defined hypodense liver lesions concerning for metastatic disease 3. 5.6 cm complex lobulated cyst mid pole left kidney. When the patient is clinically stable and able to follow directions and hold their breath (preferably as an outpatient) further evaluation with dedicated abdominal MRI should be considered. Electronically Signed   By: Donavan Foil M.D.   On: 05/18/2021 22:36   US BIOPSY (LIVER)  Result Date: 05/23/2021 INDICATION: 86 year old female with left lower lobe pulmonary nodule and multifocal hepatic lesions concerning for primary bronchogenic carcinoma with hepatic metastatic disease. She presents for ultrasound-guided core biopsy of the same. EXAM: ULTRASOUND BIOPSY CORE LIVER MEDICATIONS: None. ANESTHESIA/SEDATION: Moderate (conscious) sedation was  employed during this procedure. A total of Versed 2 mg and Fentanyl 100 mcg was administered intravenously. Moderate Sedation Time: 12 minutes. The patient's level of consciousness and vital signs were monitored continuously by radiology nursing throughout the procedure under my direct supervision. FLUOROSCOPY TIME:  None. COMPLICATIONS: None immediate. PROCEDURE: Informed written consent was obtained from the patient after a thorough discussion of the procedural risks, benefits and alternatives. All questions were addressed. Maximal Sterile Barrier Technique was utilized including caps, mask, sterile gowns, sterile gloves, sterile drape, hand hygiene and skin antiseptic. A timeout was performed prior to the initiation of the procedure. The right upper quadrant was interrogated with ultrasound. Multifocal hypoechoic solid liver lesions are identified. A suitable skin entry site was selected and marked. The region was sterilely prepped and draped in the standard fashion using chlorhexidine skin prep. Local anesthesia was attained by infiltration with 1% lidocaine. A small dermatotomy was made. Under real-time sonographic guidance, a 17 gauge introducer needle was advanced into the margin of the mass. Multiple 18 gauge biopsies were then coaxially obtained using the BioPince automated biopsy device. Biopsy samples were placed in formalin and delivered to pathology for further analysis. As a 5 gauge introducer needle was removed, the biopsy tract was embolized with a Gel-Foam slurry. Post biopsy ultrasound imaging demonstrates no evidence of active hemorrhage or perihepatic hematoma. The patient tolerated the procedure well. IMPRESSION: Technically successful ultrasound-guided core biopsy of hepatic lesion. Electronically Signed   By: Jacqulynn Cadet M.D.   On: 05/23/2021 13:44     CT Head Wo Contrast  Result Date: 05/18/2021 CLINICAL DATA:  Abdomen pain diarrhea EXAM: CT HEAD WITHOUT CONTRAST TECHNIQUE:  Contiguous axial images were obtained from the base of the skull through the vertex without intravenous contrast. COMPARISON:  MRI brain 01/02/2014 FINDINGS: Brain: No acute territorial infarction, hemorrhage or  intracranial mass. Moderate atrophy. Cerebellar atrophy. Nonenlarged ventricles. Patchy white matter hypodensity consistent with chronic small vessel ischemic change Vascular: No hyperdense vessels.  Carotid vascular calcification Skull: Normal. Negative for fracture or focal lesion. Sinuses/Orbits: No acute finding. Other: None IMPRESSION: 1. No CT evidence for acute intracranial abnormality. 2. Cortical and cerebellar atrophy. Chronic small vessel ischemic changes of the white matter Electronically Signed   By: Donavan Foil M.D.   On: 05/18/2021 22:15   CT Chest W Contrast  Result Date: 05/18/2021 CLINICAL DATA:  GI issues poor p.o. intake abdomen pain EXAM: CT CHEST, ABDOMEN, AND PELVIS WITH CONTRAST TECHNIQUE: Multidetector CT imaging of the chest, abdomen and pelvis was performed following the standard protocol during bolus administration of intravenous contrast. CONTRAST:  10m OMNIPAQUE IOHEXOL 350 MG/ML SOLN COMPARISON:  MRI 01/08/2020, CT 12/02/2015, ultrasound 12/22/2005 FINDINGS: CT CHEST FINDINGS Cardiovascular: Mild aortic atherosclerosis. No aneurysm. Coronary vascular calcification. Normal cardiac size. No pericardial effusion Mediastinum/Nodes: Midline trachea. Subcentimeter left thyroid nodule, no further workup recommended based on age of patient and size of lesion. No suspicious adenopathy. Esophagus within normal limits Lungs/Pleura: Biapical pleural and parenchymal scarring. Bandlike density in the left lower lobe extending to the pleural surface as noted on previous and suggestive of scarring. Interval change in morphology of sub solid area in the superior left lower lobe, now appears more solid and spiculated, this measures 2.7 cm AP x 1.5 cm transverse by 2.4 cm craniocaudad.  Given interval change in morphology, findings are concerning for lung carcinoma. Musculoskeletal: Sternum is intact.  No suspicious bone lesion. CT ABDOMEN PELVIS FINDINGS Hepatobiliary: Ill-defined multiple hypodense liver nodules. No calcified gallstone or biliary dilatation Pancreas: Unremarkable. No pancreatic ductal dilatation or surrounding inflammatory changes. Spleen: Normal in size without focal abnormality. Adrenals/Urinary Tract: Adrenal glands are normal. Kidneys show no hydronephrosis. Subcentimeter cortical hypodense renal lesions, too small to further characterize. 5.6 by 4 by 4 cm complex lobulated cyst in the left kidney containing septations and calcification. Urinary bladder is unremarkable. Stomach/Bowel: The stomach is nonenlarged. No dilated small bowel. No acute bowel wall thickening. The appendix is not well visualized. Scattered diverticular disease. Vascular/Lymphatic: Moderate aortic atherosclerosis. No aneurysm. No suspicious nodes Reproductive: Uterus and bilateral adnexa are unremarkable. Other: Negative for free air or free fluid. Musculoskeletal: Degenerative changes of the spine. Small focal sclerosis at T12, possibly a small bone island. IMPRESSION: 1. Bandlike density in the left lower lobe that extends to the pleural surface, suggestive of scarring. Interval change in morphology of previously noted ground-glass density in the left lower lobe associated with the linear density, now appears more solid and spiculated, measuring up to 2.7 cm and concerning for lung carcinoma. Pulmonary consultation is recommended. 2. Multiple ill-defined hypodense liver lesions concerning for metastatic disease 3. 5.6 cm complex lobulated cyst mid pole left kidney. When the patient is clinically stable and able to follow directions and hold their breath (preferably as an outpatient) further evaluation with dedicated abdominal MRI should be considered. Electronically Signed   By: KDonavan FoilM.D.    On: 05/18/2021 22:36   MR ABDOMEN W WO CONTRAST  Result Date: 05/27/2021 CLINICAL DATA:  Follow-up renal cyst/liver masses. EXAM: MRI ABDOMEN WITHOUT AND WITH CONTRAST TECHNIQUE: Multiplanar multisequence MR imaging of the abdomen was performed both before and after the administration of intravenous contrast. CONTRAST:  746mGADAVIST GADOBUTROL 1 MMOL/ML IV SOLN COMPARISON:  Comparison made with CT of the chest, abdomen and pelvis of May 18, 2021. FINDINGS: Lower chest: LEFT  lower lobe nodularity not as well demonstrated as on the recent CT of the chest, abdomen and pelvis. No effusion. Hepatobiliary: Multiple hepatic lesions. Diffuse abnormal signal in the LEFT hepatic lobe. Greater than 30 lesions in the liver. Most of these in the range of cm but again with confluent disease throughout the LEFT hepatic lobe lateral segment. These are best seen on diffusion and T2 weighted images as there is motion artifact associated with the postcontrast images. (Image 56/8) 14 mm lesion in the posterior RIGHT hepatic lobe. Multiple lesions seen on image 45 of series 8 largest approximately a cm approximately 14-15 lesions on that single image. No pericholecystic stranding or biliary duct dilation. Pancreas: No signs of pancreatic inflammation or ductal dilation. No visible lesion. Imaged compromised by motion. Spleen:  Spleen normal size and contour. Adrenals/Urinary Tract:  Adrenal glands are normal. Cysts are present in the bilateral kidneys. Largest in the LEFT posterior lower pole with thin nonenhancing septations measuring approximately 4.5 x 3.8 x 4.4 cm. Smaller cysts elsewhere in the bilateral kidneys. Stomach/Bowel: No acute gastrointestinal process to the extent evaluated on abdominal MRI. Vascular/Lymphatic: Vascular structures without dilation. There is no gastrohepatic or hepatoduodenal ligament lymphadenopathy. No retroperitoneal or mesenteric lymphadenopathy. Other:  No ascites. Musculoskeletal: Multifocal  bone lesions with all visualized levels of the spine showing restricted diffusion nearly replacing the marrow in multiple levels of the spine best seen on coronal images. Also involving the visualized pelvis nearly diffuse involvement of the iliac bones posteriorly IMPRESSION: 1. Signs of multiple hepatic metastatic lesions with confluent disease in the LEFT hepatic lobe and multiple smaller lesions in the RIGHT hepatic lobe as described. 2. Diffuse bony metastatic disease throughout the spine and visualized pelvis. 3. Pulmonary findings better seen on recent chest abdomen pelvis CT. 4. Findings that favor a Bosniak category II cyst in the lower pole of the LEFT kidney. Given motion artifact on the current study would suggest attention on subsequent imaging. No signs of enhancement are seen on motion degraded images. Electronically Signed   By: Zetta Bills M.D.   On: 05/27/2021 22:40   CT ABDOMEN PELVIS W CONTRAST  Result Date: 05/18/2021 CLINICAL DATA:  GI issues poor p.o. intake abdomen pain EXAM: CT CHEST, ABDOMEN, AND PELVIS WITH CONTRAST TECHNIQUE: Multidetector CT imaging of the chest, abdomen and pelvis was performed following the standard protocol during bolus administration of intravenous contrast. CONTRAST:  42m OMNIPAQUE IOHEXOL 350 MG/ML SOLN COMPARISON:  MRI 01/08/2020, CT 12/02/2015, ultrasound 12/22/2005 FINDINGS: CT CHEST FINDINGS Cardiovascular: Mild aortic atherosclerosis. No aneurysm. Coronary vascular calcification. Normal cardiac size. No pericardial effusion Mediastinum/Nodes: Midline trachea. Subcentimeter left thyroid nodule, no further workup recommended based on age of patient and size of lesion. No suspicious adenopathy. Esophagus within normal limits Lungs/Pleura: Biapical pleural and parenchymal scarring. Bandlike density in the left lower lobe extending to the pleural surface as noted on previous and suggestive of scarring. Interval change in morphology of sub solid area in the  superior left lower lobe, now appears more solid and spiculated, this measures 2.7 cm AP x 1.5 cm transverse by 2.4 cm craniocaudad. Given interval change in morphology, findings are concerning for lung carcinoma. Musculoskeletal: Sternum is intact.  No suspicious bone lesion. CT ABDOMEN PELVIS FINDINGS Hepatobiliary: Ill-defined multiple hypodense liver nodules. No calcified gallstone or biliary dilatation Pancreas: Unremarkable. No pancreatic ductal dilatation or surrounding inflammatory changes. Spleen: Normal in size without focal abnormality. Adrenals/Urinary Tract: Adrenal glands are normal. Kidneys show no hydronephrosis. Subcentimeter cortical hypodense renal  lesions, too small to further characterize. 5.6 by 4 by 4 cm complex lobulated cyst in the left kidney containing septations and calcification. Urinary bladder is unremarkable. Stomach/Bowel: The stomach is nonenlarged. No dilated small bowel. No acute bowel wall thickening. The appendix is not well visualized. Scattered diverticular disease. Vascular/Lymphatic: Moderate aortic atherosclerosis. No aneurysm. No suspicious nodes Reproductive: Uterus and bilateral adnexa are unremarkable. Other: Negative for free air or free fluid. Musculoskeletal: Degenerative changes of the spine. Small focal sclerosis at T12, possibly a small bone island. IMPRESSION: 1. Bandlike density in the left lower lobe that extends to the pleural surface, suggestive of scarring. Interval change in morphology of previously noted ground-glass density in the left lower lobe associated with the linear density, now appears more solid and spiculated, measuring up to 2.7 cm and concerning for lung carcinoma. Pulmonary consultation is recommended. 2. Multiple ill-defined hypodense liver lesions concerning for metastatic disease 3. 5.6 cm complex lobulated cyst mid pole left kidney. When the patient is clinically stable and able to follow directions and hold their breath (preferably as an  outpatient) further evaluation with dedicated abdominal MRI should be considered. Electronically Signed   By: Donavan Foil M.D.   On: 05/18/2021 22:36   US BIOPSY (LIVER)  Result Date: 05/23/2021 INDICATION: 86 year old female with left lower lobe pulmonary nodule and multifocal hepatic lesions concerning for primary bronchogenic carcinoma with hepatic metastatic disease. She presents for ultrasound-guided core biopsy of the same. EXAM: ULTRASOUND BIOPSY CORE LIVER MEDICATIONS: None. ANESTHESIA/SEDATION: Moderate (conscious) sedation was employed during this procedure. A total of Versed 2 mg and Fentanyl 100 mcg was administered intravenously. Moderate Sedation Time: 12 minutes. The patient's level of consciousness and vital signs were monitored continuously by radiology nursing throughout the procedure under my direct supervision. FLUOROSCOPY TIME:  None. COMPLICATIONS: None immediate. PROCEDURE: Informed written consent was obtained from the patient after a thorough discussion of the procedural risks, benefits and alternatives. All questions were addressed. Maximal Sterile Barrier Technique was utilized including caps, mask, sterile gowns, sterile gloves, sterile drape, hand hygiene and skin antiseptic. A timeout was performed prior to the initiation of the procedure. The right upper quadrant was interrogated with ultrasound. Multifocal hypoechoic solid liver lesions are identified. A suitable skin entry site was selected and marked. The region was sterilely prepped and draped in the standard fashion using chlorhexidine skin prep. Local anesthesia was attained by infiltration with 1% lidocaine. A small dermatotomy was made. Under real-time sonographic guidance, a 17 gauge introducer needle was advanced into the margin of the mass. Multiple 18 gauge biopsies were then coaxially obtained using the BioPince automated biopsy device. Biopsy samples were placed in formalin and delivered to pathology for further  analysis. As a 38 gauge introducer needle was removed, the biopsy tract was embolized with a Gel-Foam slurry. Post biopsy ultrasound imaging demonstrates no evidence of active hemorrhage or perihepatic hematoma. The patient tolerated the procedure well. IMPRESSION: Technically successful ultrasound-guided core biopsy of hepatic lesion. Electronically Signed   By: Jacqulynn Cadet M.D.   On: 05/23/2021 13:44    Assessment and Plan:  1.  Left lung mass, liver lesions, and bone lesions concerning for malignancy-biopsy pending 2.  Anorexia/weight loss/protein calorie malnutrition 3.  Large complex lobulated left renal cyst 4.  Hyperthyroidism 5.  Hypertension 6.  Hyperlipidemia 7.  Ascending aortic aneurysm  -Discussed imaging findings with the patient.  Findings are concerning for metastatic malignancy.  Biopsy currently pending -hopefully results later today. -Unclear if this is a  metastatic lung cancer (in a never smoker) versus another primary malignancy.  We will have to await the pathology results to have further discussions regarding treatment options for this patient. -May require additional work-up including an MRI of the brain and possibly tumor markers depending on biopsy results.  We will hold off on ordering this at this time pending biopsy. -She has a poor nutritional status and is taking in very little p.o.  Her prealbumin was 7.6 on 05/25/2021.  I am concerned that her poor nutritional status may make treatment of her malignancy more difficult.  She has been started on Marinol.  Family hesitant about feeding tube and want to discuss further with oncology prior to considering this.  Thank you for this referral.   Mikey Bussing, DNP, AGPCNP-BC, AOCNP   ADDENDUM: I saw and examined Ms. Pylant.  She is very very nice.  This is a little bit complicated but I think she may have gotten a huge break with the diagnosis.  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  This would be an  atypical pulmonary carcinoid.  She has never smoked.  She has a lot of diarrhea.  As such, I suspect this is coming from the atypical carcinoid.  I think that an atypical carcinoid would be the only thing that could potentially be treated.  I think she is going to need a nuclear medicine scan specific for carcinoid.  This can tell us the activity and where things are located.  I think that it would be worthwhile to get an echocardiogram on her to make sure there is no cardiac issues.  We will get a Chromogranin A level on her.  I would think this would be elevated.  Ultimately, I think the treatment is going to be a somatostatin analog.  I think this would be the only thing that she would be able to be treated with.  Now, I realize that the pathologist was not 100% positive that this cannot be a large cell neuroendocrine tumor.  Typically for these, we have to treat with chemotherapy.  Again she is in no condition to take systemic chemotherapy.  Again, I think that we should give her a chance and treated with Somatuline.  I think this would be frontline therapy for carcinoids.  I suspect that pulmonary carcinoids may have a little bit of a different biology than gastrointestinal carcinoids.  However, I would think that there would still be a decent response to Somatuline.  Hopefully, her diarrhea will improve.  She will be able to eat better and gain weight and have a better quality of life.  I told Ms. Dung and 2 of her children that our goal here is quality of life.  We are dealing with a situation that is not curable but certainly is treatable and hopefully she can live a decent amount of time with a good performance status and good quality of life.  I do not think we she needs to be discharged tomorrow.  We need to give her the Somatuline and see how she does with this.  I am not sure if we can get a nuclear medicine scan on her over the weekend.  This actually might need to be done as  an outpatient.  This is quite interesting.  Again she never smoked.  She did work in the tobacco fields.  She is incredibly charming.  Hopefully, we can improve her quality of life and her performance status so that she can  have a good and functional life.  I do appreciate the outstanding care that she is getting from all the staff up on 5 E.    Lattie Haw, MD  Oswaldo Milian 41:10

## 2021-05-30 NOTE — Plan of Care (Signed)

## 2021-05-30 NOTE — Progress Notes (Signed)
Physical Therapy Treatment Patient Details Name: Karla Chavez MRN: [id number] DOB: [birth year] Today's Date: 05/30/2021    History of Present Illness  Pt admitted 2* ABD pain and diarrhea and found to have LLL nodule/mass, multiple hypodense liver lesions and L renal cyst.  Pt with hx of Vertigo and aortic aneurysm.    PT Comments    Patient making steady progress with acute PT and advanced gait distance to ~90' today. Cues required for safety with walker and posture limited due to abdominal pain and weakness. Educated on functional LE strengthening with sit<>stands. Acute PT will continue to progress as able.     05/30/21 1200  PT Visit Information  Last PT Received On 05/30/21  Assistance Needed +1  History of Present Illness Pt admitted 2* ABD pain and diarrhea and found to have LLL nodule/mass, multiple hypodense liver lesions and L renal cyst.  Pt with hx of Vertigo and aortic aneurysm.  Subjective Data  Patient Stated Goal home  Precautions  Precautions Fall  Restrictions  Weight Bearing Restrictions No  Pain Assessment  Pain Assessment Faces  Faces Pain Scale 2  Pain Location abdomen  Pain Descriptors / Indicators Aching;Sore  Pain Intervention(s) Limited activity within patient's tolerance;Monitored during session;Repositioned  Cognition  Arousal/Alertness Awake/alert  Behavior During Therapy WFL for tasks assessed/performed  Overall Cognitive Status Within Functional Limits for tasks assessed  Bed Mobility  General bed mobility comments pt sitting EOB at start and end of session.  Transfers  Overall transfer level Needs assistance  Equipment used Rolling walker (2 wheeled)  Transfers Sit to/from Stand  Sit to Stand Min guard  General transfer comment cues for hand placement with single UE use for power up from EOB. Min guard for safety, pt steady but flexed posture in standing.  Ambulation/Gait  Ambulation/Gait assistance Min assist  Gait Distance (Feet) 90  Feet  Assistive device Rolling walker (2 wheeled)  Gait Pattern/deviations Step-through pattern;Decreased stride length;Narrow base of support;Trunk flexed  General Gait Details cues and assist for safe proximity to RW, pt with flexed posture and cues to look up. slow and short steps throughout gait.  Gait velocity decr  Balance  Overall balance assessment Needs assistance  Sitting-balance support Feet supported  Sitting balance-Leahy Scale Good  Standing balance support During functional activity;Bilateral upper extremity supported  Standing balance-Leahy Scale Poor  Exercises  Exercises Other exercises  Other Exercises  Other Exercises 5x Sit<>Stand from recliner (last 3 no Ue's used for power up)  PT - End of Session  Equipment Utilized During Treatment Gait belt  Activity Tolerance Patient tolerated treatment well  Patient left in bed;with call bell/phone within reach;with family/visitor present  Nurse Communication Mobility status   PT - Assessment/Plan  PT Plan Current plan remains appropriate  PT Visit Diagnosis Difficulty in walking, not elsewhere classified (R26.2);Unsteadiness on feet (R26.81)  PT Frequency (ACUTE ONLY) Min 3X/week  Follow Up Recommendations Home health PT  PT equipment None recommended by PT  AM-PAC PT "6 Clicks" Mobility Outcome Measure (Version 2)  Help needed turning from your back to your side while in a flat bed without using bedrails? 4  Help needed moving from lying on your back to sitting on the side of a flat bed without using bedrails? 4  Help needed moving to and from a bed to a chair (including a wheelchair)? 3  Help needed standing up from a chair using your arms (e.g., wheelchair or bedside chair)? 3  Help needed to walk in  hospital room? 3  Help needed climbing 3-5 steps with a railing?  3  6 Click Score 20  Consider Recommendation of Discharge To: Home with no services  Progressive Mobility  What is the highest level of mobility based on  the progressive mobility assessment? Level 5 (Walks with assist in room/hall) - Balance while stepping forward/back and can walk in room with assist - Complete  Mobility Ambulated with assistance in hallway  PT Goal Progression  Progress towards PT goals Progressing toward goals  Acute Rehab PT Goals  PT Goal Formulation With patient  Time For Goal Achievement 06/08/21  Potential to Achieve Goals Good  PT Time Calculation  PT Start Time (ACUTE ONLY) 1120  PT Stop Time (ACUTE ONLY) 1140  PT Time Calculation (min) (ACUTE ONLY) 20 min  PT General Charges  $$ ACUTE PT VISIT 1 Visit  PT Treatments  $Gait Training 8-22 mins    Verner Mould, DPT Acute Rehabilitation Services Office [phone] Pager [phone]  Karla Chavez 05/30/2021, 7:12 PM

## 2021-05-30 NOTE — TOC Initial Note (Signed)
Transition of Care [facility]) - Initial/Assessment Note    Patient Details  Name: Karla Chavez MRN: [id number] Date of Birth: 1935-10-02  [facility] CM/SW Contact:    Trish Mage, LCSW Phone Number: 05/30/2021, 3:29 PM  Clinical Narrative:     Patient seen in follow up to PT recommendation of [place], 24 hour supervision.  Met with patient, son Sherren Mocha who was at bedside.  He states he and his three siblings who all live in same area are planning on providing round the clock in home supervision/help for Ms Appleman. They are in need of DME walker, bedside commode, shower chair.  They would also like Ruth RN, PT services.   Orders for DME seen and appreciated.  [place] and asked for supplies to be delivered to room.  Lattie Haw with [facility] agreed to provide [facility] services.  Will need order at d/c. TOC will continue to follow during the course of hospitalization.             Expected Discharge Plan: [place] Barriers to Discharge: No Barriers Identified   Patient Goals and CMS Choice     Choice offered to / list presented to : Patient, Adult Children  Expected Discharge Plan and Services Expected Discharge Plan: [place]   Discharge Planning Services: CM Consult Post Acute Care Choice: Nursing Home Living arrangements for the past 2 months: Single Family Home                                      Prior Living Arrangements/Services Living arrangements for the past 2 months: Single Family Home Lives with:: Self Patient language and need for interpreter reviewed:: Yes        Need for Family Participation in Patient Care: Yes (Comment) Care giver support system in place?: Yes (comment) Current home services: DME Criminal Activity/Legal Involvement Pertinent to Current Situation/Hospitalization: No - Comment as needed  Activities of Daily Living Home Assistive Devices/Equipment: Dentures (specify type), Eyeglasses ADL  Screening (condition at time of admission) Patient's cognitive ability adequate to safely complete daily activities?: Yes Is the patient deaf or have difficulty hearing?: No Does the patient have difficulty seeing, even when wearing glasses/contacts?: No Does the patient have difficulty concentrating, remembering, or making decisions?: No Patient able to express need for assistance with ADLs?: Yes Does the patient have difficulty dressing or bathing?: No Independently performs ADLs?: Yes (appropriate for developmental age) Does the patient have difficulty walking or climbing stairs?: Yes Weakness of Legs: Both Weakness of Arms/Hands: Both  Permission Sought/Granted Permission sought to share information with : Family Supports Permission granted to share information with : Yes, Verbal Permission Granted  Share Information with NAME: Recker,Todd (Son)   [phone]           Emotional Assessment Appearance:: Appears stated age Attitude/Demeanor/Rapport: Engaged Affect (typically observed): Appropriate Orientation: : Oriented to Self, Oriented to Place, Oriented to  Time, Oriented to Situation Alcohol / Substance Use: Not Applicable Psych Involvement: No (comment)  Admission diagnosis:  Abdominal mass [R19.00] Lung mass [R91.8] Liver masses [R16.0] Acute pancreatitis, unspecified complication status, unspecified pancreatitis type [K85.90] Patient Active Problem List   Diagnosis Date Noted   Malnutrition of moderate degree 05/27/2021   Abdominal mass 05/18/2021   Abdominal pain 05/18/2021   Anxiety and depression 05/18/2021   Aortic aneurysm ([place]) 12/24/2015   Insomnia  12/24/2015   Coronary artery calcification seen on CAT scan 06/13/2015   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 [place] [address]Phone: [phone]: 3[place] - [address] [address]Phone: [phone]: [phone]    Social Determinants of Health (SDOH) Interventions    Readmission Risk Interventions No flowsheet data found.

## 2021-05-30 NOTE — Progress Notes (Signed)
PROGRESS NOTE    Karla Chavez  [id number] DOB: [birth year] DOA: 05/18/2021 PCP: Aletha Halim., PA-C     Brief Narrative:  Karla Chavez is an 86 year old F with PMH of HTN, HLD, vertigo, LLL nodule, osteoarthritis, GERD, appendectomy and aortic aneurysm presenting with intermittent diarrhea, abdominal pain, decreased appetite and about 15 to 20 pound unintentional weight loss in the last 2 months.  She had CT abdomen and pelvis on 7/12 that showed multiple liver lesions, wall thickening in transverse colon and large complex left renal cyst. She had GI appointment for 7/18 but pain acutely gotten worse and prompted her to come to ED.     CT chest/abdomen/pelvis showed LLL density extending to the pleural surface with interval change in morphology of some GG density in the left lower lobe solid and spiculated mass about 2.7 cm concerning for lung cancer, as well as multiple ill-defined hypodense liver lesions concerning for metastasis and 5.6 cm complex lobulated cyst in the midpole left kidney.  C. difficile negative.  GIP with EPEC.  Started on azithromycin. Diarrhea seems to have improved. Patient underwent liver biopsy on 05/23/2021.  Pathology pending.   New events last 24 hours / Subjective: Patient continues to feel about the same.  Still not eating much.  Pathology is still pending.  Had a conversation with son again over the phone today, family is still having a hard time making a decision in regards to cortrak.  He feels that they might want to take patient home with outpatient palliative care services and follow-up with Dr. Julien Nordmann as scheduled 8/9.  Asking to speak with palliative care again today, was last seen by Dr. Rowe Pavy 7/25.  Assessment & Plan:   Principal Problem:   Abdominal mass Active Problems:   Hypertension   Hyperlipidemia   Coronary artery calcification seen on CAT scan   Abdominal pain   Anxiety and depression   Malnutrition of moderate degree   LLL  nodule/mass/multiple ill-defined hypodense liver lesions -CT shows interval change in morphology of previously noted 2.7 cm GG density in LLL that appears more solid and spiculated concerning for lung cancer, and multiple liver lesions concerning for metastatic disease. -S/p liver biopsy on 05/23/2021.  Pathology pending   Decreased appetite/oral intake/unintentional weight loss -Likely due to the above.  Appetite remains poor.  Prealbumin 7.6. -Continue regular diet and supplements -Continue multivitamin  -Started Marinol 2.5 mg on 7/23 -Patient and family very hesitant about feeding tube, at least until liver pathology results   Infectious diarrhea due to EPEC -Completed 3 days of azithromycin on 7/21.  C. difficile negative.  Resolved.  Elevated lipase -No radiologic evidence of pancreatitis.   -GI signed off.   Large complex complex lobulated left renal cyst -Follow up with MRI showed findings that favor a Bosniak category II cyst in the lower pole of the LEFT kidney.    Primary hyperthyroidism -TSH low at 0.014.  Free T4 elevated to 2.54.  Could be contributing to her anorexia -Continue Inderal 40 mg 3 times daily -Needs outpatient follow-up with endocrinology -Hold off methimazole in case she needs RAI study outpatient.   Sinus tachycardia with PACs -Continue inderal to 40 mg 3 times daily   Essential hypertension -Continue home amlodipine and Avapro   Hyperlipidemia -Continue home statin   Ascending aortic aneurysm -3.8 cm  on CT angio chest in 2017 -Follow up    DVT prophylaxis:  enoxaparin (LOVENOX) injection 40 mg Start: 05/23/21 2000  Code Status:  Code Status Orders  (From admission, onward)           Start     Ordered   05/19/21 0026  Full code  Continuous        05/19/21 0025           Code Status History     Date Active Date Inactive Code Status Order ID Comments User Context   03/01/2014 1228 03/02/2014 1311 Full Code [id number]   Jodi Marble, MD Inpatient      Family Communication: Spoke with son over the phone today  Disposition Plan:  Status is: Inpatient  Remains inpatient appropriate because:IV treatments appropriate due to intensity of illness or inability to take PO and Inpatient level of care appropriate due to severity of illness  Dispo: The patient is from: Home              Anticipated d/c is to: Home              Patient currently is not medically stable to d/c.   Difficult to place patient No      Antimicrobials:  Anti-infectives (From admission, onward)    Start     Dose/Rate Route Frequency Ordered Stop   05/20/21 1700  azithromycin (ZITHROMAX) 500 mg in sodium chloride 0.9 % 250 mL IVPB        500 mg 250 mL/hr over 60 Minutes Intravenous Daily 05/20/21 1607 05/22/21 1045        Objective: Vitals:   05/29/21 2012 05/30/21 0500 05/30/21 0542 05/30/21 0918  BP: 134/78  (!) 118/53 (!) 142/74  Pulse: 73  65 72  Resp: 18  18   Temp: 98.6 F (37 C)  98.2 F (36.8 C)   TempSrc:      SpO2: 91%  90%   Weight:  62.9 kg    Height:        Intake/Output Summary (Last 24 hours) at 05/30/2021 1220 Last data filed at 05/30/2021 0952 Gross per 24 hour  Intake 716 ml  Output --  Net 716 ml    Filed Weights   05/27/21 0500 05/29/21 0500 05/30/21 0500  Weight: 65.1 kg 64.6 kg 62.9 kg   Examination: General exam: Appears calm and comfortable, weak appearing  Respiratory system: Clear to auscultation. Respiratory effort normal. Cardiovascular system: S1 & S2 heard, RRR. No pedal edema. Gastrointestinal system: Abdomen is nondistended, soft and nontender. Normal bowel sounds heard. Central nervous system: Alert and oriented. Non focal exam. Speech clear  Extremities: Symmetric in appearance bilaterally  Skin: No rashes, lesions or ulcers on exposed skin  Psychiatry: Judgement and insight appear stable. Mood & affect appropriate.    Data Reviewed: I have personally reviewed  following labs and imaging studies  CBC: Recent Labs  Lab 05/24/21 0457 05/28/21 0529  WBC  --  7.9  HGB 12.0 11.2*  HCT 35.3* 32.9*  MCV  --  92.2  PLT  --  [id number]    Basic Metabolic Panel: Recent Labs  Lab 05/24/21 0457 05/26/21 0504 05/28/21 0529 05/29/21 0540 05/30/21 0607  NA 141  --  144 142 139  K 3.8  --  3.5 3.5 3.7  CL 112*  --  111 113* 110  CO2 20*  --  20* 24 21*  GLUCOSE 80  --  97 121* 130*  BUN 11  --  '11 11 10  '$ CREATININE 0.51 0.61 0.52 0.52 0.50  CALCIUM 8.9  --  8.9 8.2* 8.1*  MG 2.0  --  1.7 1.6* 1.8  PHOS 3.5  --  3.5  --   --     GFR: Estimated Creatinine Clearance: 50.9 mL/min (by C-G formula based on SCr of 0.5 mg/dL). Liver Function Tests: Recent Labs  Lab 05/24/21 0457 05/28/21 0529  ALBUMIN 3.0* 2.7*    No results for input(s): LIPASE, AMYLASE in the last 168 hours. No results for input(s): AMMONIA in the last 168 hours.  Coagulation Profile: No results for input(s): INR, PROTIME in the last 168 hours. Cardiac Enzymes: No results for input(s): CKTOTAL, CKMB, CKMBINDEX, TROPONINI in the last 168 hours. BNP (last 3 results) No results for input(s): PROBNP in the last 8760 hours. HbA1C: No results for input(s): HGBA1C in the last 72 hours. CBG: No results for input(s): GLUCAP in the last 168 hours. Lipid Profile: No results for input(s): CHOL, HDL, LDLCALC, TRIG, CHOLHDL, LDLDIRECT in the last 72 hours. Thyroid Function Tests: No results for input(s): TSH, T4TOTAL, FREET4, T3FREE, THYROIDAB in the last 72 hours. Anemia Panel: No results for input(s): VITAMINB12, FOLATE, FERRITIN, TIBC, IRON, RETICCTPCT in the last 72 hours. Sepsis Labs: No results for input(s): PROCALCITON, LATICACIDVEN in the last 168 hours.  No results found for this or any previous visit (from the past 240 hour(s)).     Radiology Studies: No results found.    Scheduled Meds:  (feeding supplement) PROSource Plus  30 mL Oral TID BM   amLODipine  5 mg  Oral Daily   atorvastatin  40 mg Oral Daily   citalopram  10 mg Oral Daily   clopidogrel  75 mg Oral Daily   dronabinol  2.5 mg Oral BID AC   enoxaparin (LOVENOX) injection  40 mg Subcutaneous Q24H   irbesartan  150 mg Oral Daily   multivitamin with minerals  1 tablet Oral Daily   pantoprazole  40 mg Oral BID   propranolol  40 mg Oral TID   Continuous Infusions:  dextrose 5 % and 0.45 % NaCl with KCl 20 mEq/L 75 mL/hr at 05/30/21 0554     LOS: 12 days      Time spent: 40 minutes   Dessa Phi, DO [facility] 05/30/2021, 12:20 PM   Available via Epic secure chat 7am-7pm After these hours, please refer to coverage provider listed on [email or web address]

## 2021-05-31 ENCOUNTER — Inpatient Hospital Stay (HOSPITAL_COMMUNITY): Payer: Medicare (Managed Care)

## 2021-05-31 ENCOUNTER — Encounter (HOSPITAL_COMMUNITY): Payer: Self-pay | Admitting: Internal Medicine

## 2021-05-31 DIAGNOSIS — K529 Noninfective gastroenteritis and colitis, unspecified: Secondary | ICD-10-CM

## 2021-05-31 DIAGNOSIS — M899 Disorder of bone, unspecified: Secondary | ICD-10-CM | POA: Diagnosis not present

## 2021-05-31 DIAGNOSIS — R008 Other abnormalities of heart beat: Secondary | ICD-10-CM | POA: Diagnosis not present

## 2021-05-31 DIAGNOSIS — E3400 Carcinoid syndrome, unspecified: Secondary | ICD-10-CM

## 2021-05-31 DIAGNOSIS — N281 Cyst of kidney, acquired: Secondary | ICD-10-CM | POA: Diagnosis not present

## 2021-05-31 DIAGNOSIS — C7A090 Malignant carcinoid tumor of the bronchus and lung: Secondary | ICD-10-CM | POA: Diagnosis not present

## 2021-05-31 DIAGNOSIS — Z7189 Other specified counseling: Secondary | ICD-10-CM

## 2021-05-31 DIAGNOSIS — C7B02 Secondary carcinoid tumors of liver: Secondary | ICD-10-CM | POA: Diagnosis not present

## 2021-05-31 HISTORY — DX: Malignant carcinoid tumor of the bronchus and lung: C7A.090

## 2021-05-31 HISTORY — DX: Noninfective gastroenteritis and colitis, unspecified: K52.9

## 2021-05-31 HISTORY — DX: Other specified counseling: Z71.89

## 2021-05-31 LAB — ECHOCARDIOGRAM COMPLETE
Area-P 1/2: 3.23 cm2
Height: 67 in
S' Lateral: 1.9 cm
Weight: 2225.76 oz

## 2021-05-31 LAB — CBC WITH DIFFERENTIAL/PLATELET
Abs Immature Granulocytes: 0.11 10*3/uL — ABNORMAL HIGH (ref 0.00–0.07)
Basophils Absolute: 0 10*3/uL (ref 0.0–0.1)
Basophils Relative: 1 %
Eosinophils Absolute: 0 10*3/uL (ref 0.0–0.5)
Eosinophils Relative: 1 %
HCT: 35.5 % — ABNORMAL LOW (ref 36.0–46.0)
Hemoglobin: 11.8 g/dL — ABNORMAL LOW (ref 12.0–15.0)
Immature Granulocytes: 1 %
Lymphocytes Relative: 13 %
Lymphs Abs: 1.1 10*3/uL (ref 0.7–4.0)
MCH: 30.4 pg (ref 26.0–34.0)
MCHC: 33.2 g/dL (ref 30.0–36.0)
MCV: 91.5 fL (ref 80.0–100.0)
Monocytes Absolute: 0.7 10*3/uL (ref 0.1–1.0)
Monocytes Relative: 9 %
Neutro Abs: 6.3 10*3/uL (ref 1.7–7.7)
Neutrophils Relative %: 75 %
Platelets: 254 10*3/uL (ref 150–400)
RBC: 3.88 MIL/uL (ref 3.87–5.11)
RDW: 12.1 % (ref 11.5–15.5)
WBC: 8.3 10*3/uL (ref 4.0–10.5)
nRBC: 0 % (ref 0.0–0.2)

## 2021-05-31 LAB — COMPREHENSIVE METABOLIC PANEL
ALT: 39 U/L (ref 0–44)
AST: 63 U/L — ABNORMAL HIGH (ref 15–41)
Albumin: 2.6 g/dL — ABNORMAL LOW (ref 3.5–5.0)
Alkaline Phosphatase: 154 U/L — ABNORMAL HIGH (ref 38–126)
Anion gap: 10 (ref 5–15)
BUN: 7 mg/dL — ABNORMAL LOW (ref 8–23)
CO2: 22 mmol/L (ref 22–32)
Calcium: 8.7 mg/dL — ABNORMAL LOW (ref 8.9–10.3)
Chloride: 110 mmol/L (ref 98–111)
Creatinine, Ser: 0.51 mg/dL (ref 0.44–1.00)
GFR, Estimated: >60 mL/min (ref >60)
Glucose, Bld: 120 mg/dL — ABNORMAL HIGH (ref 70–99)
Potassium: 3.9 mmol/L (ref 3.5–5.1)
Sodium: 142 mmol/L (ref 135–145)
Total Bilirubin: 0.6 mg/dL (ref 0.3–1.2)
Total Protein: 5.8 g/dL — ABNORMAL LOW (ref 6.5–8.1)

## 2021-05-31 LAB — PREALBUMIN: Prealbumin: 5.9 mg/dL — ABNORMAL LOW (ref 18–38)

## 2021-05-31 LAB — MAGNESIUM: Magnesium: 1.8 mg/dL (ref 1.7–2.4)

## 2021-05-31 LAB — LACTATE DEHYDROGENASE: LDH: 327 U/L — ABNORMAL HIGH (ref 98–192)

## 2021-05-31 MED ORDER — ZOLEDRONIC ACID 4 MG/5ML IV CONC
4.0000 mg | Freq: Once | INTRAVENOUS | Status: AC
  Administered 2021-05-31: 4 mg via INTRAVENOUS
  Filled 2021-05-31: qty 5

## 2021-05-31 MED ORDER — PREDNISONE 10 MG PO TABS
10.0000 mg | ORAL_TABLET | Freq: Every day | ORAL | Status: DC
  Administered 2021-05-31 – 2021-06-02 (×3): 10 mg via ORAL
  Filled 2021-05-31 (×3): qty 1

## 2021-05-31 MED ORDER — LOPERAMIDE HCL 2 MG PO CAPS
4.0000 mg | ORAL_CAPSULE | Freq: Four times a day (QID) | ORAL | Status: DC
Start: 2021-05-31 — End: 2021-06-02
  Administered 2021-05-31 – 2021-06-01 (×5): 4 mg via ORAL
  Filled 2021-05-31 (×5): qty 2

## 2021-05-31 MED ORDER — OPIUM 10 MG/ML (1%) PO TINC
2.0000 [drp] | ORAL | Status: DC | PRN
  Filled 2021-05-31: qty 0.1

## 2021-05-31 NOTE — Progress Notes (Signed)
Echocardiogram 2D Echocardiogram has been performed.  Oneal Deputy Sharaya Boruff RDCS 05/31/2021, 12:52 PM

## 2021-05-31 NOTE — Progress Notes (Signed)
Ms. Stilson had a tough night.  She a lot of diarrhea.  We will see if some tincture of opium might help.  Again I had believe this is from the carcinoid that she has.  I think if she gets the Somatuline, this should help.  Again she did not want to eat because of the diarrhea.  Maybe we can try her on a little bit of prednisone.  I know she is on Marinol.  She does have the bony lesions.  I will give her a dose of Zometa.  Hopefully this will help coat her bones and will help prevent any type of fractures.  The main issue clearly is her lack of nutrition and appetite.  The diarrhea is a real problem.  Again if we can just get the Somatuline into her, this might be able to start to slow down the diarrhea.  She has had no fever.  She has had no bleeding.  There is no lower back on her today.  Again, if we can get the diarrhea under control, she will feel better.  I know she is getting wonderful care from all the staff up on [address].  I do appreciate all of their hard work.  Lattie Haw, MD  Psalm 46:10

## 2021-05-31 NOTE — Progress Notes (Signed)
PROGRESS NOTE    Karla Chavez  [id number] DOB: 1935/08/14 DOA: 05/18/2021 PCP: Aletha Halim., PA-C     Brief Narrative:  Karla Chavez is an 86 year old F with PMH of HTN, HLD, vertigo, LLL nodule, osteoarthritis, GERD, appendectomy and aortic aneurysm presenting with intermittent diarrhea, abdominal pain, decreased appetite and about 15 to 20 pound unintentional weight loss in the last 2 months.  She had CT abdomen and pelvis on 7/12 that showed multiple liver lesions, wall thickening in transverse colon and large complex left renal cyst. She had GI appointment for 7/18 but pain acutely gotten worse and prompted her to come to ED.     CT chest/abdomen/pelvis showed LLL density extending to the pleural surface with interval change in morphology of some GG density in the left lower lobe solid and spiculated mass about 2.7 cm concerning for lung cancer, as well as multiple ill-defined hypodense liver lesions concerning for metastasis and 5.6 cm complex lobulated cyst in the midpole left kidney.  C. difficile negative.  GIP with EPEC.  Started on azithromycin. Diarrhea seems to have improved. Patient underwent liver biopsy on 05/23/2021.  Pathology showing metastatic neuroendocrine neoplasm involving the liver, consistent with lung primary, differential includes atypical carcinoid and large cell neuroendocrine carcinoma.  Oncology consulted.  New events last 24 hours / Subjective: Patient had a lot of diarrhea overnight.   Assessment & Plan:   Active Problems:   Hypertension   Hyperlipidemia   Coronary artery calcification seen on CAT scan   Anxiety and depression   Malnutrition of moderate degree   Primary atypical carcinoid tumor of left lung (HCC)   Diarrhea concurrent with and due to carcinoid syndrome (HCC)   Goals of care, counseling/discussion   Atypical pulmonary carcinoid -CT shows interval change in morphology of previously noted 2.7 cm GG density in LLL that  appears more solid and spiculated concerning for lung cancer, and multiple liver lesions concerning for metastatic disease. -S/p liver biopsy on 05/23/2021. Pathology showing metastatic neuroendocrine neoplasm involving the liver, consistent with lung primary, differential includes atypical carcinoid and large cell neuroendocrine carcinoma.   -Appreciate Dr. Marin Olp. Patient started on somatuline.    Decreased appetite/oral intake/unintentional weight loss -Likely due to the above.  Appetite remains poor.  Prealbumin 7.6. -Continue regular diet and supplements -Continue multivitamin  -Started Marinol 2.5 mg on 7/23   Infectious diarrhea due to EPEC -Completed 3 days of azithromycin on 7/21.  C. difficile negative.    Elevated lipase -No radiologic evidence of pancreatitis.   -GI signed off.   Large complex complex lobulated left renal cyst -Follow up with MRI showed findings that favor a Bosniak category II cyst in the lower pole of the LEFT kidney.    Primary hyperthyroidism -TSH low at 0.014.  Free T4 elevated to 2.54.  Could be contributing to her anorexia -Continue Inderal 40 mg 3 times daily -Needs outpatient follow-up with endocrinology -Hold off methimazole in case she needs RAI study outpatient.   Sinus tachycardia with PACs -Continue inderal to 40 mg 3 times daily   Essential hypertension -Continue home amlodipine and Avapro   Hyperlipidemia -Continue home statin   Ascending aortic aneurysm -3.8 cm  on CT angio chest in 2017 -Follow up    DVT prophylaxis:  enoxaparin (LOVENOX) injection 40 mg Start: 05/23/21 2000  Code Status:     Code Status Orders  (From admission, onward)           Start  Ordered   05/19/21 0026  Full code  Continuous        05/19/21 0025           Code Status History     Date Active Date Inactive Code Status Order ID Comments User Context   03/01/2014 1228 03/02/2014 1311 Full Code [id number]  Jodi Marble, MD Inpatient       Family Communication: Daughter at bedside Disposition Plan:  Status is: Inpatient  Remains inpatient appropriate because:IV treatments appropriate due to intensity of illness or inability to take PO and Inpatient level of care appropriate due to severity of illness  Dispo: The patient is from: Home              Anticipated d/c is to: Home              Patient currently is not medically stable to d/c.   Difficult to place patient No      Antimicrobials:  Anti-infectives (From admission, onward)    Start     Dose/Rate Route Frequency Ordered Stop   05/20/21 1700  azithromycin (ZITHROMAX) 500 mg in sodium chloride 0.9 % 250 mL IVPB        500 mg 250 mL/hr over 60 Minutes Intravenous Daily 05/20/21 1607 05/22/21 1045        Objective: Vitals:   05/30/21 1611 05/30/21 2058 05/31/21 0500 05/31/21 0511  BP: (!) 148/78 (!) 123/54  (!) 145/66  Pulse: 74 71  72  Resp:  18  18  Temp:  98 F (36.7 C)  98.4 F (36.9 C)  TempSrc:    Oral  SpO2:  91%  94%  Weight:   63.1 kg   Height:        Intake/Output Summary (Last 24 hours) at 05/31/2021 1253 Last data filed at 05/31/2021 0900 Gross per 24 hour  Intake 236 ml  Output --  Net 236 ml    Filed Weights   05/29/21 0500 05/30/21 0500 05/31/21 0500  Weight: 64.6 kg 62.9 kg 63.1 kg   Examination: General exam: Appears calm and comfortable  Respiratory system: Clear to auscultation. Respiratory effort normal. Cardiovascular system: S1 & S2 heard, RRR. No pedal edema. Gastrointestinal system: Abdomen is nondistended, soft and nontender. Normal bowel sounds heard. Central nervous system: Alert and oriented. Non focal exam. Speech clear  Extremities: Symmetric in appearance bilaterally  Skin: No rashes, lesions or ulcers on exposed skin  Psychiatry: Judgement and insight appear stable. Mood & affect appropriate.     Data Reviewed: I have personally reviewed following labs and imaging studies  CBC: Recent Labs   Lab 05/28/21 0529 05/31/21 0752  WBC 7.9 8.3  NEUTROABS  --  6.3  HGB 11.2* 11.8*  HCT 32.9* 35.5*  MCV 92.2 91.5  PLT 231 0000000    Basic Metabolic Panel: Recent Labs  Lab 05/26/21 0504 05/28/21 0529 05/29/21 0540 05/30/21 0607 05/31/21 0752  NA  --  144 142 139 142  K  --  3.5 3.5 3.7 3.9  CL  --  111 113* 110 110  CO2  --  20* 24 21* 22  GLUCOSE  --  97 121* 130* 120*  BUN  --  '11 11 10 '$ 7*  CREATININE 0.61 0.52 0.52 0.50 0.51  CALCIUM  --  8.9 8.2* 8.1* 8.7*  MG  --  1.7 1.6* 1.8 1.8  PHOS  --  3.5  --   --   --     GFR: Estimated Creatinine  Clearance: 50.9 mL/min (by C-G formula based on SCr of 0.51 mg/dL). Liver Function Tests: Recent Labs  Lab 05/28/21 0529 05/31/21 0752  AST  --  63*  ALT  --  39  ALKPHOS  --  154*  BILITOT  --  0.6  PROT  --  5.8*  ALBUMIN 2.7* 2.6*    No results for input(s): LIPASE, AMYLASE in the last 168 hours. No results for input(s): AMMONIA in the last 168 hours.  Coagulation Profile: No results for input(s): INR, PROTIME in the last 168 hours. Cardiac Enzymes: No results for input(s): CKTOTAL, CKMB, CKMBINDEX, TROPONINI in the last 168 hours. BNP (last 3 results) No results for input(s): PROBNP in the last 8760 hours. HbA1C: No results for input(s): HGBA1C in the last 72 hours. CBG: No results for input(s): GLUCAP in the last 168 hours. Lipid Profile: No results for input(s): CHOL, HDL, LDLCALC, TRIG, CHOLHDL, LDLDIRECT in the last 72 hours. Thyroid Function Tests: No results for input(s): TSH, T4TOTAL, FREET4, T3FREE, THYROIDAB in the last 72 hours. Anemia Panel: No results for input(s): VITAMINB12, FOLATE, FERRITIN, TIBC, IRON, RETICCTPCT in the last 72 hours. Sepsis Labs: No results for input(s): PROCALCITON, LATICACIDVEN in the last 168 hours.  No results found for this or any previous visit (from the past 240 hour(s)).     Radiology Studies: No results found.    Scheduled Meds:  (feeding supplement)  PROSource Plus  30 mL Oral TID BM   amLODipine  5 mg Oral Daily   atorvastatin  40 mg Oral Daily   citalopram  10 mg Oral Daily   clopidogrel  75 mg Oral Daily   dronabinol  2.5 mg Oral BID AC   enoxaparin (LOVENOX) injection  40 mg Subcutaneous Q24H   irbesartan  150 mg Oral Daily   loperamide  4 mg Oral Q6H   multivitamin with minerals  1 tablet Oral Daily   pantoprazole  40 mg Oral BID   predniSONE  10 mg Oral Q breakfast   propranolol  40 mg Oral TID   Continuous Infusions:  dextrose 5 % and 0.45 % NaCl with KCl 20 mEq/L 75 mL/hr at 05/31/21 0943     LOS: 13 days      Time spent: 25 minutes   Dessa Phi, DO [facility] 05/31/2021, 12:53 PM   Available via Epic secure chat 7am-7pm After these hours, please refer to coverage provider listed on [email or web address]

## 2021-06-01 DIAGNOSIS — C7A090 Malignant carcinoid tumor of the bronchus and lung: Secondary | ICD-10-CM | POA: Diagnosis not present

## 2021-06-01 LAB — BASIC METABOLIC PANEL
Anion gap: 8 (ref 5–15)
BUN: 10 mg/dL (ref 8–23)
CO2: 23 mmol/L (ref 22–32)
Calcium: 8.1 mg/dL — ABNORMAL LOW (ref 8.9–10.3)
Chloride: 108 mmol/L (ref 98–111)
Creatinine, Ser: 0.6 mg/dL (ref 0.44–1.00)
GFR, Estimated: >60 mL/min (ref >60)
Glucose, Bld: 161 mg/dL — ABNORMAL HIGH (ref 70–99)
Potassium: 3.9 mmol/L (ref 3.5–5.1)
Sodium: 139 mmol/L (ref 135–145)

## 2021-06-01 MED ORDER — ALPRAZOLAM 0.5 MG PO TABS
0.5000 mg | ORAL_TABLET | Freq: Once | ORAL | Status: AC
  Administered 2021-06-01: 0.5 mg via ORAL
  Filled 2021-06-01: qty 1

## 2021-06-01 NOTE — Progress Notes (Signed)
PROGRESS NOTE    Karla Chavez  [id number] DOB: January 22, 1935 DOA: 05/18/2021 PCP: Aletha Halim., PA-C     Brief Narrative:  Karla Chavez is an 86 year old F with PMH of HTN, HLD, vertigo, LLL nodule, osteoarthritis, GERD, appendectomy and aortic aneurysm presenting with intermittent diarrhea, abdominal pain, decreased appetite and about 15 to 20 pound unintentional weight loss in the last 2 months.  She had CT abdomen and pelvis on 7/12 that showed multiple liver lesions, wall thickening in transverse colon and large complex left renal cyst. She had GI appointment for 7/18 but pain acutely gotten worse and prompted her to come to ED.     CT chest/abdomen/pelvis showed LLL density extending to the pleural surface with interval change in morphology of some GG density in the left lower lobe solid and spiculated mass about 2.7 cm concerning for lung cancer, as well as multiple ill-defined hypodense liver lesions concerning for metastasis and 5.6 cm complex lobulated cyst in the midpole left kidney.  C. difficile negative.  GIP with EPEC.  Started on azithromycin. Diarrhea seems to have improved. Patient underwent liver biopsy on 05/23/2021.  Pathology showing metastatic neuroendocrine neoplasm involving the liver, consistent with lung primary, differential includes atypical carcinoid and large cell neuroendocrine carcinoma.  Oncology consulted.  New events last 24 hours / Subjective: Without new complaints, still has no desire to eat, wants to go home.   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 -S/p liver biopsy on 05/23/2021. Pathology showing metastatic neuroendocrine neoplasm involving the liver, consistent with lung primary, differential includes atypical carcinoid and large cell neuroendocrine carcinoma.   -Appreciate Dr. Marin Olp. Patient started on somatuline.    Decreased appetite/oral intake/unintentional weight loss -Likely due to the above.  Appetite remains poor.  Prealbumin 7.6. -Continue regular diet and supplements -Continue multivitamin  -Started Marinol 2.5 mg on 7/23 -Started on steroids 7/30    Infectious diarrhea due to EPEC -Completed 3 days of azithromycin on 7/21.  C. difficile negative.    Elevated lipase -No radiologic evidence of pancreatitis.   -GI signed off.   Large complex complex lobulated left renal cyst -Follow up with MRI showed findings that favor a Bosniak category II cyst in the lower pole of the LEFT kidney.    Primary hyperthyroidism -TSH low at 0.014.  Free T4 elevated to 2.54.  Could be contributing to her anorexia -Continue Inderal 40 mg 3 times daily -Needs outpatient follow-up with endocrinology -Hold off methimazole in case she needs RAI study outpatient.   Sinus tachycardia with PACs -Continue inderal to 40 mg 3 times daily   Essential hypertension -Continue home amlodipine and Avapro   Hyperlipidemia -Continue home statin   Ascending aortic aneurysm -3.8 cm  on CT angio chest in 2017 -Follow up    DVT prophylaxis:  enoxaparin (LOVENOX) injection 40 mg Start: 05/23/21 2000  Code Status:     Code Status Orders  (From admission, onward)  Start     Ordered   05/19/21 0026  Full code  Continuous        05/19/21 0025           Code Status History     Date Active Date Inactive Code Status Order ID Comments User Context    03/01/2014 1228 03/02/2014 1311 Full Code [id number]  Jodi Marble, MD Inpatient      Family Communication: Son at bedside Disposition Plan:  Status is: Inpatient  Remains inpatient appropriate because:IV treatments appropriate due to intensity of illness or inability to take PO and Inpatient level of care appropriate due to severity of illness  Dispo: The patient is from: Home              Anticipated d/c is to: Home              Patient currently is not medically stable to d/c.   Difficult to place patient No      Antimicrobials:  Anti-infectives (From admission, onward)    Start     Dose/Rate Route Frequency Ordered Stop   05/20/21 1700  azithromycin (ZITHROMAX) 500 mg in sodium chloride 0.9 % 250 mL IVPB        500 mg 250 mL/hr over 60 Minutes Intravenous Daily 05/20/21 1607 05/22/21 1045        Objective: Vitals:   05/31/21 1419 05/31/21 2014 06/01/21 0500 06/01/21 0615  BP: 122/71 (!) 141/71  129/64  Pulse: 74 73  79  Resp: '16 20  16  '$ Temp: 98.9 F (37.2 C) 98.9 F (37.2 C)  98.7 F (37.1 C)  TempSrc: Oral Oral  Oral  SpO2: 91% 92%  90%  Weight:   63.3 kg   Height:        Intake/Output Summary (Last 24 hours) at 06/01/2021 1143 Last data filed at 06/01/2021 0900 Gross per 24 hour  Intake 3121.35 ml  Output --  Net 3121.35 ml    Filed Weights   05/30/21 0500 05/31/21 0500 06/01/21 0500  Weight: 62.9 kg 63.1 kg 63.3 kg     Examination: General exam: Appears calm, weak and frail Respiratory system: Clear to auscultation. Respiratory effort normal. Cardiovascular system: S1 & S2 heard, RRR. No pedal edema. Gastrointestinal system: Abdomen is nondistended, soft and nontender. Normal bowel sounds heard. Central nervous system: Alert and oriented. Non focal exam. Speech clear  Extremities: Symmetric in appearance bilaterally  Skin: No rashes, lesions or ulcers on exposed skin  Psychiatry: Judgement and insight appear stable. Mood & affect appropriate.     Data Reviewed: I have personally reviewed following labs and imaging studies  CBC: Recent Labs  Lab 05/28/21 0529 05/31/21 0752  WBC 7.9 8.3  NEUTROABS  --  6.3  HGB 11.2* 11.8*  HCT 32.9* 35.5*  MCV 92.2 91.5  PLT 231 0000000    Basic Metabolic Panel: Recent Labs  Lab 05/28/21 0529 05/29/21 0540 05/30/21 0607 05/31/21 0752 06/01/21 0605  NA 144 142 139 142 139  K 3.5 3.5 3.7 3.9 3.9  CL 111 113* 110 110 108  CO2 20* 24 21* 22 23  GLUCOSE 97 121* 130* 120* 161*  BUN '11 11 10 '$ 7* 10  CREATININE 0.52 0.52 0.50 0.51 0.60  CALCIUM 8.9 8.2* 8.1* 8.7* 8.1*  MG 1.7 1.6* 1.8 1.8  --   PHOS 3.5  --   --   --   --     GFR: Estimated Creatinine Clearance: 50.9 mL/min (by C-G formula based  on SCr of 0.6 mg/dL). Liver Function Tests: Recent Labs  Lab 05/28/21 0529 05/31/21 0752  AST  --  63*  ALT  --  39  ALKPHOS  --  154*  BILITOT  --  0.6  PROT  --  5.8*  ALBUMIN 2.7* 2.6*    No results for input(s): LIPASE, AMYLASE in the last 168 hours. No results for input(s): AMMONIA in the last 168 hours.  Coagulation Profile: No results for input(s): INR, PROTIME in the last 168 hours. Cardiac Enzymes: No results for input(s): CKTOTAL, CKMB, CKMBINDEX, TROPONINI in the last 168 hours. BNP (last 3 results) No results for input(s): PROBNP in the last 8760 hours. HbA1C: No results for input(s): HGBA1C in the last 72 hours. CBG: No results for input(s): GLUCAP in the last 168 hours. Lipid Profile: No results for input(s): CHOL, HDL, LDLCALC, TRIG, CHOLHDL, LDLDIRECT in the last 72 hours. Thyroid Function Tests: No results for input(s): TSH, T4TOTAL, FREET4, T3FREE, THYROIDAB in the last 72 hours. Anemia Panel: No results for input(s): VITAMINB12, FOLATE, FERRITIN, TIBC, IRON, RETICCTPCT in the last 72 hours. Sepsis Labs: No results for input(s): PROCALCITON, LATICACIDVEN in the last 168 hours.  No results found for this or any previous visit (from the past 240  hour(s)).     Radiology Studies: ECHOCARDIOGRAM COMPLETE  Result Date: 05/31/2021    ECHOCARDIOGRAM REPORT   Patient Name:   Karla Chavez Date of Exam: 05/31/2021 Medical Rec #:  [id number]         Height:       67.0 in Accession #:    [id number]        Weight:       139.1 lb Date of Birth:  17-Jan-1935          BSA:          1.733 m Patient Age:    49 years          BP:           145/66 mmHg Patient Gender: F                 HR:           75 bpm. Exam Location:  Inpatient Procedure: 2D Echo, Color Doppler and Cardiac Doppler Indications:    Other abnormalities of the heart  History:        Patient has prior history of Echocardiogram examinations, most                 recent 06/10/2011. Risk Factors:Hypertension and Dyslipidemia.  Sonographer:    Raquel Sarna Senior RDCS Referring Phys: [place]  1. The aortic valve is tricuspid. Aortic valve regurgitation is mild to moderate.  2. Aortic dilatation noted. There is borderline dilatation of the ascending aorta, measuring 39 mm, though this is within normal limits when indexed to age and body surface area.  3. Left ventricular ejection fraction, by estimation, is 60 to 65%. The left ventricle has normal function. The left ventricle has no regional wall motion abnormalities. Left ventricular diastolic parameters are consistent with Grade I diastolic dysfunction (impaired relaxation).  4. Right ventricular systolic function is normal. The right ventricular size is normal.  5. The mitral valve is normal in structure. No evidence of mitral valve regurgitation. No evidence of mitral stenosis.  6. The inferior vena cava is normal in size with greater than 50% respiratory variability, suggesting right atrial pressure of 3 mmHg. Comparison(s): A prior study  was performed on 06/10/2011. No significant change from prior study. Prior images reviewed side by side. FINDINGS  Left Ventricle: Left ventricular ejection fraction, by estimation, is 60 to 65%. The  left ventricle has normal function. The left ventricle has no regional wall motion abnormalities. The left ventricular internal cavity size was small. There is no left ventricular hypertrophy of the basal-septal segment. Left ventricular diastolic parameters are consistent with Grade I diastolic dysfunction (impaired relaxation). Right Ventricle: The right ventricular size is normal. No increase in right ventricular wall thickness. Right ventricular systolic function is normal. Left Atrium: Left atrial size was normal in size. Right Atrium: Right atrial size was normal in size. Pericardium: There is no evidence of pericardial effusion. Mitral Valve: The mitral valve is normal in structure. No evidence of mitral valve regurgitation. No evidence of mitral valve stenosis. Tricuspid Valve: The tricuspid valve is normal in structure. Tricuspid valve regurgitation is not demonstrated. Aortic Valve: The aortic valve is tricuspid. Aortic valve regurgitation is mild to moderate. Pulmonic Valve: The pulmonic valve was grossly normal. Pulmonic valve regurgitation is mild. No evidence of pulmonic stenosis. Aorta: Aortic dilatation noted. There is borderline dilatation of the ascending aorta, measuring 39 mm. Venous: The inferior vena cava is normal in size with greater than 50% respiratory variability, suggesting right atrial pressure of 3 mmHg. IAS/Shunts: The atrial septum is grossly normal.  LEFT VENTRICLE PLAX 2D LVIDd:         3.60 cm  Diastology LVIDs:         1.90 cm  LV e' medial:    6.20 cm/s LV PW:         0.90 cm  LV E/e' medial:  12.3 LV IVS:        1.50 cm  LV e' lateral:   7.29 cm/s LVOT diam:     1.90 cm  LV E/e' lateral: 10.5 LV SV:         100 LV SV Index:   57 LVOT Area:     2.84 cm  RIGHT VENTRICLE RV S prime:     10.60 cm/s TAPSE (M-mode): 1.8 cm LEFT ATRIUM             Index       RIGHT ATRIUM           Index LA diam:        3.40 cm 1.96 cm/m  RA Area:     13.00 cm LA Vol (A2C):   44.9 ml 25.91 ml/m RA  Volume:   27.90 ml  16.10 ml/m LA Vol (A4C):   32.6 ml 18.81 ml/m LA Biplane Vol: 41.9 ml 24.18 ml/m  AORTIC VALVE LVOT Vmax:   185.00 cm/s LVOT Vmean:  131.000 cm/s LVOT VTI:    0.351 m  AORTA Ao Root diam: 3.00 cm Ao Asc diam:  3.90 cm MITRAL VALVE MV Area (PHT): 3.23 cm     SHUNTS MV Decel Time: 235 msec     Systemic VTI:  0.35 m MV E velocity: 76.30 cm/s   Systemic Diam: 1.90 cm MV A velocity: 115.00 cm/s MV E/A ratio:  0.66 Rudean Haskell MD Electronically signed by Rudean Haskell MD Signature Date/Time: 05/31/2021/1:36:07 PM    Final       Scheduled Meds:  (feeding supplement) PROSource Plus  30 mL Oral TID BM   amLODipine  5 mg Oral Daily   atorvastatin  40 mg Oral Daily   citalopram  10 mg Oral Daily   clopidogrel  75 mg Oral Daily   dronabinol  2.5 mg Oral BID AC   enoxaparin (LOVENOX) injection  40 mg Subcutaneous Q24H   irbesartan  150 mg Oral Daily   loperamide  4 mg Oral Q6H   multivitamin with minerals  1 tablet Oral Daily   pantoprazole  40 mg Oral BID   predniSONE  10 mg Oral Q breakfast   propranolol  40 mg Oral TID   Continuous Infusions:  dextrose 5 % and 0.45 % NaCl with KCl 20 mEq/L 75 mL/hr at 05/31/21 0943     LOS: 14 days      Time spent: 25 minutes   Dessa Phi, DO [facility] 06/01/2021, 11:43 AM   Available via Epic secure chat 7am-7pm After these hours, please refer to coverage provider listed on [email or web address]

## 2021-06-02 DIAGNOSIS — N281 Cyst of kidney, acquired: Secondary | ICD-10-CM | POA: Diagnosis not present

## 2021-06-02 DIAGNOSIS — M899 Disorder of bone, unspecified: Secondary | ICD-10-CM | POA: Diagnosis not present

## 2021-06-02 DIAGNOSIS — C7B02 Secondary carcinoid tumors of liver: Secondary | ICD-10-CM | POA: Diagnosis not present

## 2021-06-02 DIAGNOSIS — C7A090 Malignant carcinoid tumor of the bronchus and lung: Secondary | ICD-10-CM | POA: Diagnosis not present

## 2021-06-02 LAB — BASIC METABOLIC PANEL
Anion gap: 5 (ref 5–15)
BUN: 11 mg/dL (ref 8–23)
CO2: 25 mmol/L (ref 22–32)
Calcium: 7.6 mg/dL — ABNORMAL LOW (ref 8.9–10.3)
Chloride: 109 mmol/L (ref 98–111)
Creatinine, Ser: 0.53 mg/dL (ref 0.44–1.00)
GFR, Estimated: >60 mL/min (ref >60)
Glucose, Bld: 142 mg/dL — ABNORMAL HIGH (ref 70–99)
Potassium: 3.9 mmol/L (ref 3.5–5.1)
Sodium: 139 mmol/L (ref 135–145)

## 2021-06-02 LAB — CHROMOGRANIN A: Chromogranin A (ng/mL): 905.2 ng/mL — ABNORMAL HIGH (ref 0.0–101.8)

## 2021-06-02 MED ORDER — PROCHLORPERAZINE 25 MG RE SUPP: 25.0000 mg | RECTAL | 0 refills | Status: DC | PRN

## 2021-06-02 MED ORDER — PANTOPRAZOLE SODIUM 40 MG PO TBEC: 40.0000 mg | DELAYED_RELEASE_TABLET | Freq: Two times a day (BID) | ORAL | 0 refills | Status: AC

## 2021-06-02 MED ORDER — OXYCODONE HCL 5 MG PO TABS: 5.0000 mg | ORAL_TABLET | ORAL | 0 refills | Status: DC | PRN

## 2021-06-02 MED ORDER — PREDNISONE 10 MG PO TABS: 10.0000 mg | ORAL_TABLET | Freq: Every day | ORAL | 0 refills | Status: DC

## 2021-06-02 MED ORDER — ATORVASTATIN CALCIUM 40 MG PO TABS: 40.0000 mg | ORAL_TABLET | Freq: Every day | ORAL | 0 refills | Status: AC

## 2021-06-02 MED ORDER — LOPERAMIDE HCL 2 MG PO CAPS: 4.0000 mg | ORAL_CAPSULE | Freq: Four times a day (QID) | ORAL | 0 refills | Status: AC

## 2021-06-02 MED ORDER — PROPRANOLOL HCL 40 MG PO TABS: 40.0000 mg | ORAL_TABLET | Freq: Three times a day (TID) | ORAL | 0 refills | Status: AC

## 2021-06-02 MED ORDER — PREDNISONE 10 MG PO TABS: 10.0000 mg | ORAL_TABLET | Freq: Every day | ORAL | 0 refills | Status: AC

## 2021-06-02 MED ORDER — OXYCODONE HCL 5 MG PO TABS
5.0000 mg | ORAL_TABLET | ORAL | Status: DC | PRN
Start: 2021-06-02 — End: 2021-06-02
  Administered 2021-06-02: 5 mg via ORAL
  Filled 2021-06-02: qty 1

## 2021-06-02 MED ORDER — MEGESTROL ACETATE 400 MG/10ML PO SUSP
400.0000 mg | Freq: Two times a day (BID) | ORAL | Status: DC
  Filled 2021-06-02 (×2): qty 10

## 2021-06-02 MED ORDER — LOPERAMIDE HCL 2 MG PO CAPS: 4.0000 mg | ORAL_CAPSULE | Freq: Four times a day (QID) | ORAL | 0 refills | Status: DC

## 2021-06-02 MED ORDER — PROCHLORPERAZINE 25 MG RE SUPP: 25.0000 mg | RECTAL | 0 refills | Status: AC | PRN

## 2021-06-02 MED ORDER — ATORVASTATIN CALCIUM 40 MG PO TABS: 40.0000 mg | ORAL_TABLET | Freq: Every day | ORAL | 0 refills | Status: DC

## 2021-06-02 MED ORDER — OXYCODONE HCL 5 MG PO TABS: 5.0000 mg | ORAL_TABLET | ORAL | 0 refills | Status: AC | PRN

## 2021-06-02 MED ORDER — MEGESTROL ACETATE 400 MG/10ML PO SUSP: 400.0000 mg | Freq: Two times a day (BID) | ORAL | 0 refills | Status: AC

## 2021-06-02 MED ORDER — PROPRANOLOL HCL 40 MG PO TABS: 40.0000 mg | ORAL_TABLET | Freq: Three times a day (TID) | ORAL | 0 refills | Status: DC

## 2021-06-02 MED ORDER — PANTOPRAZOLE SODIUM 40 MG PO TBEC: 40.0000 mg | DELAYED_RELEASE_TABLET | Freq: Two times a day (BID) | ORAL | 0 refills | Status: DC

## 2021-06-02 MED ORDER — LORAZEPAM 0.5 MG PO TABS: 0.5000 mg | ORAL_TABLET | ORAL | 0 refills | Status: DC | PRN

## 2021-06-02 MED ORDER — MEGESTROL ACETATE 400 MG/10ML PO SUSP
400.0000 mg | Freq: Two times a day (BID) | ORAL | 0 refills | Status: DC
Start: 2021-06-02 — End: 2021-06-02

## 2021-06-02 NOTE — Progress Notes (Signed)
Over the weekend, she did get the Somatuline.  She still is having issues with not wanting to eat.  I spoke to her daughter today.  To the real issue that we have is a her prealbumin is only 5.9.  And this is a very ominous prognostic factor.  I realize that she may not have obvious "cancer" but that this is an atypical carcinoid, I think the prealbumin of 5.9 still is incredibly significant.  The only way that we are going to improve this is for her to eat.  Again she may not want to eat.  She is on Marinol.  I have her on a little prednisone.  We will try to do all we can do to try to get her to eat.  The diarrhea is a lot better.  This probably is from the Imodium that she had.  She did get the Somatuline.  I do not have the Chromogranin A level back yet.  She seemed fairly alert this morning.  She is not in pain.  There is no bleeding.  Again, the prealbumin I think will "drive" her prognosis.  I know that there is no CODE STATUS yet on her.  I may have to start talking to her family about this.  Again, she is not going to eat, she will not get better.  The Somatuline might help a little bit but again, she really needs to get better nutrition and get that prealbumin higher.  I know that she is getting outstanding care from all the staff up on [address].  I appreciate all their hard work.   Lattie Haw, MD  [place] 11:24

## 2021-06-02 NOTE — TOC Transition Note (Signed)
Transition of Care [facility]) - CM/SW Discharge Note   Patient Details  Name: Karla Chavez MRN: [id number] Date of Birth: 05-25-1935  [facility] CM/SW Contact:  Trish Mage, LCSW Phone Number: 06/02/2021, 11:26 AM   Clinical Narrative:   Patient who is stable for d/c today will now go home with hospice services in home.  Confirmed with daughter Venida Jarvis that they want to use services of [facility], and I made a referral to them.  They will take care of O2 and hospital bed needs.  PTAR arranged.  No further needs identified.  TOC sign off.    Final next level of care: Home w Hospice Care Barriers to Discharge: No Barriers Identified   Patient Goals and CMS Choice     Choice offered to / list presented to : Patient, Adult Children  Discharge Placement                       Discharge Plan and Services   Discharge Planning Services: CM Consult Post Acute Care Choice: Nursing Home                               Social Determinants of Health (SDOH) Interventions     Readmission Risk Interventions No flowsheet data found.

## 2021-06-02 NOTE — Discharge Summary (Addendum)
Physician Discharge Summary  Karla Chavez [id number] DOB: Nov 03, 1934 DOA: 05/18/2021  PCP: Aletha Halim., PA-C  Admit date: 05/18/2021 Discharge date: 06/02/2021  Admitted From: Home Disposition:  Home with palliative care/hospice  Recommendations for Outpatient Follow-up:  Follow up with Dr. Marin Olp  Discharge Condition: Poor CODE STATUS: DNR  Diet recommendation: Regular   Brief/Interim Summary: Karla Chavez is an 86 year old F with PMH of HTN, HLD, vertigo, LLL nodule, osteoarthritis, GERD, appendectomy and aortic aneurysm presenting with intermittent diarrhea, abdominal pain, decreased appetite and about 15 to 20 pound unintentional weight loss in the last 2 months.  She had CT abdomen and pelvis on 7/12 that showed multiple liver lesions, wall thickening in transverse colon and large complex left renal cyst. She had GI appointment for 7/18 but pain acutely gotten worse and prompted her to come to ED.     CT chest/abdomen/pelvis showed LLL density extending to the pleural surface with interval change in morphology of some GG density in the left lower lobe solid and spiculated mass about 2.7 cm concerning for lung cancer, as well as multiple ill-defined hypodense liver lesions concerning for metastasis and 5.6 cm complex lobulated cyst in the midpole left kidney.  C. difficile negative.  GIP with EPEC.  Started on azithromycin. Diarrhea seems to have improved. Patient underwent liver biopsy on 05/23/2021.  Pathology showing metastatic neuroendocrine neoplasm involving the liver, consistent with lung primary, differential includes atypical carcinoid and large cell neuroendocrine carcinoma.  Oncology consulted. Patient received somatuline injection in the hospital. She continued to do poorly with low albumin and poor PO intake. She and family elected to be discharged home with home hospice.   Discharge Diagnoses:  Principal Problem:   Primary atypical carcinoid tumor of left lung  (HCC) Active Problems:   Hypertension   Hyperlipidemia   Coronary artery calcification seen on CAT scan   Anxiety and depression   Malnutrition of moderate degree   Diarrhea concurrent with and due to carcinoid syndrome (HCC)   Goals of care, counseling/discussion   Atypical pulmonary carcinoid -CT shows interval change in morphology of previously noted 2.7 cm GG density in LLL that appears more solid and spiculated concerning for lung cancer, and multiple liver lesions concerning for metastatic disease. -S/p liver biopsy on 05/23/2021. Pathology showing metastatic neuroendocrine neoplasm involving the liver, consistent with lung primary, differential includes atypical carcinoid and large cell neuroendocrine carcinoma.   -Appreciate Dr. Marin Olp. Patient given somatuline.   Decreased appetite/oral intake/unintentional weight loss -Likely due to the above.  Appetite remains poor.  Prealbumin 7.6. -Continue regular diet and supplements -Continue multivitamin -Started Marinol 2.5 mg on 7/23, family did not feel it was helping so it was discontinued 7/31 -Started on steroids 7/30    Infectious diarrhea due to EPEC -Completed 3 days of azithromycin on 7/21.  C. difficile negative.     Elevated lipase -No radiologic evidence of pancreatitis.   -GI signed off.   Large complex complex lobulated left renal cyst -Follow up with MRI showed findings that favor a Bosniak category II cyst in the lower pole of the LEFT kidney.   Primary hyperthyroidism -TSH low at 0.014.  Free T4 elevated to 2.54.  Could be contributing to her anorexia -Continue Inderal 40 mg 3 times daily   Sinus tachycardia with PACs -Continue inderal to 40 mg 3 times daily   Essential hypertension -Continue home amlodipine and Avapro   Hyperlipidemia -Continue home statin   Ascending aortic aneurysm -3.8 cm  on  CT angio chest in 2017   Discharge Instructions  Discharge Instructions     Ambulatory referral to  Hematology / Oncology   Complete by: As directed    Increase activity slowly   Complete by: As directed    No wound care   Complete by: As directed       Allergies as of 06/02/2021       Reactions   Benazepril Hcl Hives   Per patient and Doctor believed medication caused Hives         Medication List     STOP taking these medications    ALPRAZolam 0.5 MG tablet Commonly known as: XANAX   metoprolol tartrate 25 MG tablet Commonly known as: LOPRESSOR       TAKE these medications    amLODipine 5 MG tablet Commonly known as: NORVASC Take 1 tablet (5 mg total) by mouth daily.   atorvastatin 40 MG tablet Commonly known as: LIPITOR Take 1 tablet (40 mg total) by mouth daily. What changed:  medication strength how much to take   citalopram 10 MG tablet Commonly known as: CELEXA Take 10 mg by mouth daily.   clopidogrel 75 MG tablet Commonly known as: PLAVIX Take by mouth.   loperamide 2 MG capsule Commonly known as: IMODIUM Take 2 capsules (4 mg total) by mouth every 6 (six) hours.   LORazepam 0.5 MG tablet Commonly known as: ATIVAN Take 1 tablet (0.5 mg total) by mouth every 4 (four) hours as needed for anxiety. May crush, mix with water and give sublingually if needed.   megestrol 400 MG/10ML suspension Commonly known as: MEGACE Take 10 mLs (400 mg total) by mouth 2 (two) times daily.   olmesartan 20 MG tablet Commonly known as: BENICAR TAKE 1 TABLET BY MOUTH EVERY DAY   oxyCODONE 5 MG immediate release tablet Commonly known as: Oxy IR/ROXICODONE Take 1 tablet (5 mg total) by mouth every 4 (four) hours as needed for severe pain. May crush, mix with water and give sublingually if needed.   pantoprazole 40 MG tablet Commonly known as: PROTONIX Take 1 tablet (40 mg total) by mouth 2 (two) times daily.   predniSONE 10 MG tablet Commonly known as: DELTASONE Take 1 tablet (10 mg total) by mouth daily with breakfast. Start taking on: June 03, 2021    prochlorperazine 25 MG suppository Commonly known as: COMPAZINE Place 1 suppository (25 mg total) rectally every 4 (four) hours as needed for nausea or vomiting.   propranolol 40 MG tablet Commonly known as: INDERAL Take 1 tablet (40 mg total) by mouth 3 (three) times daily.               Durable Medical Equipment  (From admission, onward)           Start     Ordered   05/30/21 1315  For home use only DME Bedside commode  Once       Question:  Patient needs a bedside commode to treat with the following condition  Answer:  Weakness   05/30/21 1314   05/30/21 1314  For home use only DME Walker rolling  Once       Question Answer Comment  Walker: With [place]   Patient needs a walker to treat with the following condition Weakness      05/30/21 1314   05/30/21 1314  For home use only DME Shower stool  Once        05/30/21 1314  Follow-up Information     [facility] Follow up.   Specialty: Home Health Services Why: They will provide physical therapy, nursing services in the home Contact information: [address] [place] [address] [phone]         Volanda Napoleon, MD Follow up.   Specialty: Oncology Contact information: [address] [place] [address] [phone]                Allergies  Allergen Reactions   Benazepril Hcl Hives    Per patient and Doctor believed medication caused Hives     Consultations: GI Oncology Palliative care    Procedures/Studies: CT Head Wo Contrast  Result Date: 05/18/2021 CLINICAL DATA:  Abdomen pain diarrhea EXAM: CT HEAD WITHOUT CONTRAST TECHNIQUE: Contiguous axial images were obtained from the base of the skull through the vertex without intravenous contrast. COMPARISON:  MRI brain 01/02/2014 FINDINGS: Brain: No acute territorial infarction, hemorrhage or intracranial mass. Moderate atrophy. Cerebellar atrophy. Nonenlarged ventricles.  Patchy white matter hypodensity consistent with chronic small vessel ischemic change Vascular: No hyperdense vessels.  Carotid vascular calcification Skull: Normal. Negative for fracture or focal lesion. Sinuses/Orbits: No acute finding. Other: None IMPRESSION: 1. No CT evidence for acute intracranial abnormality. 2. Cortical and cerebellar atrophy. Chronic small vessel ischemic changes of the white matter Electronically Signed   By: Donavan Foil M.D.   On: 05/18/2021 22:15   CT Chest W Contrast  Result Date: 05/18/2021 CLINICAL DATA:  GI issues poor p.o. intake abdomen pain EXAM: CT CHEST, ABDOMEN, AND PELVIS WITH CONTRAST TECHNIQUE: Multidetector CT imaging of the chest, abdomen and pelvis was performed following the standard protocol during bolus administration of intravenous contrast. CONTRAST:  18m OMNIPAQUE IOHEXOL 350 MG/ML SOLN COMPARISON:  MRI 01/08/2020, CT 12/02/2015, ultrasound 12/22/2005 FINDINGS: CT CHEST FINDINGS Cardiovascular: Mild aortic atherosclerosis. No aneurysm. Coronary vascular calcification. Normal cardiac size. No pericardial effusion Mediastinum/Nodes: Midline trachea. Subcentimeter left thyroid nodule, no further workup recommended based on age of patient and size of lesion. No suspicious adenopathy. Esophagus within normal limits Lungs/Pleura: Biapical pleural and parenchymal scarring. Bandlike density in the left lower lobe extending to the pleural surface as noted on previous and suggestive of scarring. Interval change in morphology of sub solid area in the superior left lower lobe, now appears more solid and spiculated, this measures 2.7 cm AP x 1.5 cm transverse by 2.4 cm craniocaudad. Given interval change in morphology, findings are concerning for lung carcinoma. Musculoskeletal: Sternum is intact.  No suspicious bone lesion. CT ABDOMEN PELVIS FINDINGS Hepatobiliary: Ill-defined multiple hypodense liver nodules. No calcified gallstone or biliary dilatation Pancreas:  Unremarkable. No pancreatic ductal dilatation or surrounding inflammatory changes. Spleen: Normal in size without focal abnormality. Adrenals/Urinary Tract: Adrenal glands are normal. Kidneys show no hydronephrosis. Subcentimeter cortical hypodense renal lesions, too small to further characterize. 5.6 by 4 by 4 cm complex lobulated cyst in the left kidney containing septations and calcification. Urinary bladder is unremarkable. Stomach/Bowel: The stomach is nonenlarged. No dilated small bowel. No acute bowel wall thickening. The appendix is not well visualized. Scattered diverticular disease. Vascular/Lymphatic: Moderate aortic atherosclerosis. No aneurysm. No suspicious nodes Reproductive: Uterus and bilateral adnexa are unremarkable. Other: Negative for free air or free fluid. Musculoskeletal: Degenerative changes of the spine. Small focal sclerosis at T12, possibly a small bone island. IMPRESSION: 1. Bandlike density in the left lower lobe that extends to the pleural surface, suggestive of scarring. Interval change in morphology  of previously noted ground-glass density in the left lower lobe associated with the linear density, now appears more solid and spiculated, measuring up to 2.7 cm and concerning for lung carcinoma. Pulmonary consultation is recommended. 2. Multiple ill-defined hypodense liver lesions concerning for metastatic disease 3. 5.6 cm complex lobulated cyst mid pole left kidney. When the patient is clinically stable and able to follow directions and hold their breath (preferably as an outpatient) further evaluation with dedicated abdominal MRI should be considered. Electronically Signed   By: Donavan Foil M.D.   On: 05/18/2021 22:36   MR ABDOMEN W WO CONTRAST  Result Date: 05/27/2021 CLINICAL DATA:  Follow-up renal cyst/liver masses. EXAM: MRI ABDOMEN WITHOUT AND WITH CONTRAST TECHNIQUE: Multiplanar multisequence MR imaging of the abdomen was performed both before and after the administration  of intravenous contrast. CONTRAST:  26m GADAVIST GADOBUTROL 1 MMOL/ML IV SOLN COMPARISON:  Comparison made with CT of the chest, abdomen and pelvis of May 18, 2021. FINDINGS: Lower chest: LEFT lower lobe nodularity not as well demonstrated as on the recent CT of the chest, abdomen and pelvis. No effusion. Hepatobiliary: Multiple hepatic lesions. Diffuse abnormal signal in the LEFT hepatic lobe. Greater than 30 lesions in the liver. Most of these in the range of cm but again with confluent disease throughout the LEFT hepatic lobe lateral segment. These are best seen on diffusion and T2 weighted images as there is motion artifact associated with the postcontrast images. (Image 56/8) 14 mm lesion in the posterior RIGHT hepatic lobe. Multiple lesions seen on image 45 of series 8 largest approximately a cm approximately 14-15 lesions on that single image. No pericholecystic stranding or biliary duct dilation. Pancreas: No signs of pancreatic inflammation or ductal dilation. No visible lesion. Imaged compromised by motion. Spleen:  Spleen normal size and contour. Adrenals/Urinary Tract:  Adrenal glands are normal. Cysts are present in the bilateral kidneys. Largest in the LEFT posterior lower pole with thin nonenhancing septations measuring approximately 4.5 x 3.8 x 4.4 cm. Smaller cysts elsewhere in the bilateral kidneys. Stomach/Bowel: No acute gastrointestinal process to the extent evaluated on abdominal MRI. Vascular/Lymphatic: Vascular structures without dilation. There is no gastrohepatic or hepatoduodenal ligament lymphadenopathy. No retroperitoneal or mesenteric lymphadenopathy. Other:  No ascites. Musculoskeletal: Multifocal bone lesions with all visualized levels of the spine showing restricted diffusion nearly replacing the marrow in multiple levels of the spine best seen on coronal images. Also involving the visualized pelvis nearly diffuse involvement of the iliac bones posteriorly IMPRESSION: 1. Signs of  multiple hepatic metastatic lesions with confluent disease in the LEFT hepatic lobe and multiple smaller lesions in the RIGHT hepatic lobe as described. 2. Diffuse bony metastatic disease throughout the spine and visualized pelvis. 3. Pulmonary findings better seen on recent chest abdomen pelvis CT. 4. Findings that favor a Bosniak category II cyst in the lower pole of the LEFT kidney. Given motion artifact on the current study would suggest attention on subsequent imaging. No signs of enhancement are seen on motion degraded images. Electronically Signed   By: GZetta BillsM.D.   On: 05/27/2021 22:40   CT ABDOMEN PELVIS W CONTRAST  Result Date: 05/18/2021 CLINICAL DATA:  GI issues poor p.o. intake abdomen pain EXAM: CT CHEST, ABDOMEN, AND PELVIS WITH CONTRAST TECHNIQUE: Multidetector CT imaging of the chest, abdomen and pelvis was performed following the standard protocol during bolus administration of intravenous contrast. CONTRAST:  896mOMNIPAQUE IOHEXOL 350 MG/ML SOLN COMPARISON:  MRI 01/08/2020, CT 12/02/2015, ultrasound 12/22/2005 FINDINGS: CT  CHEST FINDINGS Cardiovascular: Mild aortic atherosclerosis. No aneurysm. Coronary vascular calcification. Normal cardiac size. No pericardial effusion Mediastinum/Nodes: Midline trachea. Subcentimeter left thyroid nodule, no further workup recommended based on age of patient and size of lesion. No suspicious adenopathy. Esophagus within normal limits Lungs/Pleura: Biapical pleural and parenchymal scarring. Bandlike density in the left lower lobe extending to the pleural surface as noted on previous and suggestive of scarring. Interval change in morphology of sub solid area in the superior left lower lobe, now appears more solid and spiculated, this measures 2.7 cm AP x 1.5 cm transverse by 2.4 cm craniocaudad. Given interval change in morphology, findings are concerning for lung carcinoma. Musculoskeletal: Sternum is intact.  No suspicious bone lesion. CT ABDOMEN  PELVIS FINDINGS Hepatobiliary: Ill-defined multiple hypodense liver nodules. No calcified gallstone or biliary dilatation Pancreas: Unremarkable. No pancreatic ductal dilatation or surrounding inflammatory changes. Spleen: Normal in size without focal abnormality. Adrenals/Urinary Tract: Adrenal glands are normal. Kidneys show no hydronephrosis. Subcentimeter cortical hypodense renal lesions, too small to further characterize. 5.6 by 4 by 4 cm complex lobulated cyst in the left kidney containing septations and calcification. Urinary bladder is unremarkable. Stomach/Bowel: The stomach is nonenlarged. No dilated small bowel. No acute bowel wall thickening. The appendix is not well visualized. Scattered diverticular disease. Vascular/Lymphatic: Moderate aortic atherosclerosis. No aneurysm. No suspicious nodes Reproductive: Uterus and bilateral adnexa are unremarkable. Other: Negative for free air or free fluid. Musculoskeletal: Degenerative changes of the spine. Small focal sclerosis at T12, possibly a small bone island. IMPRESSION: 1. Bandlike density in the left lower lobe that extends to the pleural surface, suggestive of scarring. Interval change in morphology of previously noted ground-glass density in the left lower lobe associated with the linear density, now appears more solid and spiculated, measuring up to 2.7 cm and concerning for lung carcinoma. Pulmonary consultation is recommended. 2. Multiple ill-defined hypodense liver lesions concerning for metastatic disease 3. 5.6 cm complex lobulated cyst mid pole left kidney. When the patient is clinically stable and able to follow directions and hold their breath (preferably as an outpatient) further evaluation with dedicated abdominal MRI should be considered. Electronically Signed   By: Donavan Foil M.D.   On: 05/18/2021 22:36   US BIOPSY (LIVER)  Result Date: 05/23/2021 INDICATION: 86 year old female with left lower lobe pulmonary nodule and multifocal  hepatic lesions concerning for primary bronchogenic carcinoma with hepatic metastatic disease. She presents for ultrasound-guided core biopsy of the same. EXAM: ULTRASOUND BIOPSY CORE LIVER MEDICATIONS: None. ANESTHESIA/SEDATION: Moderate (conscious) sedation was employed during this procedure. A total of Versed 2 mg and Fentanyl 100 mcg was administered intravenously. Moderate Sedation Time: 12 minutes. The patient's level of consciousness and vital signs were monitored continuously by radiology nursing throughout the procedure under my direct supervision. FLUOROSCOPY TIME:  None. COMPLICATIONS: None immediate. PROCEDURE: Informed written consent was obtained from the patient after a thorough discussion of the procedural risks, benefits and alternatives. All questions were addressed. Maximal Sterile Barrier Technique was utilized including caps, mask, sterile gowns, sterile gloves, sterile drape, hand hygiene and skin antiseptic. A timeout was performed prior to the initiation of the procedure. The right upper quadrant was interrogated with ultrasound. Multifocal hypoechoic solid liver lesions are identified. A suitable skin entry site was selected and marked. The region was sterilely prepped and draped in the standard fashion using chlorhexidine skin prep. Local anesthesia was attained by infiltration with 1% lidocaine. A small dermatotomy was made. Under real-time sonographic guidance, a 17 gauge introducer needle  was advanced into the margin of the mass. Multiple 18 gauge biopsies were then coaxially obtained using the BioPince automated biopsy device. Biopsy samples were placed in formalin and delivered to pathology for further analysis. As a 59 gauge introducer needle was removed, the biopsy tract was embolized with a Gel-Foam slurry. Post biopsy ultrasound imaging demonstrates no evidence of active hemorrhage or perihepatic hematoma. The patient tolerated the procedure well. IMPRESSION: Technically successful  ultrasound-guided core biopsy of hepatic lesion. Electronically Signed   By: Jacqulynn Cadet M.D.   On: 05/23/2021 13:44   ECHOCARDIOGRAM COMPLETE  Result Date: 05/31/2021    ECHOCARDIOGRAM REPORT   Patient Name:   KEISHAWN GULDEN Date of Exam: 05/31/2021 Medical Rec #:  [id number]         Height:       67.0 in Accession #:    [id number]        Weight:       139.1 lb Date of Birth:  August 07, 1935          BSA:          1.733 m Patient Age:    53 years          BP:           145/66 mmHg Patient Gender: F                 HR:           75 bpm. Exam Location:  Inpatient Procedure: 2D Echo, Color Doppler and Cardiac Doppler Indications:    Other abnormalities of the heart  History:        Patient has prior history of Echocardiogram examinations, most                 recent 06/10/2011. Risk Factors:Hypertension and Dyslipidemia.  Sonographer:    Raquel Sarna Senior RDCS Referring Phys: [place]  1. The aortic valve is tricuspid. Aortic valve regurgitation is mild to moderate.  2. Aortic dilatation noted. There is borderline dilatation of the ascending aorta, measuring 39 mm, though this is within normal limits when indexed to age and body surface area.  3. Left ventricular ejection fraction, by estimation, is 60 to 65%. The left ventricle has normal function. The left ventricle has no regional wall motion abnormalities. Left ventricular diastolic parameters are consistent with Grade I diastolic dysfunction (impaired relaxation).  4. Right ventricular systolic function is normal. The right ventricular size is normal.  5. The mitral valve is normal in structure. No evidence of mitral valve regurgitation. No evidence of mitral stenosis.  6. The inferior vena cava is normal in size with greater than 50% respiratory variability, suggesting right atrial pressure of 3 mmHg. Comparison(s): A prior study was performed on 06/10/2011. No significant change from prior study. Prior images reviewed side by side. FINDINGS   Left Ventricle: Left ventricular ejection fraction, by estimation, is 60 to 65%. The left ventricle has normal function. The left ventricle has no regional wall motion abnormalities. The left ventricular internal cavity size was small. There is no left ventricular hypertrophy of the basal-septal segment. Left ventricular diastolic parameters are consistent with Grade I diastolic dysfunction (impaired relaxation). Right Ventricle: The right ventricular size is normal. No increase in right ventricular wall thickness. Right ventricular systolic function is normal. Left Atrium: Left atrial size was normal in size. Right Atrium: Right atrial size was normal in size. Pericardium: There is no evidence of pericardial effusion. Mitral Valve: The mitral  valve is normal in structure. No evidence of mitral valve regurgitation. No evidence of mitral valve stenosis. Tricuspid Valve: The tricuspid valve is normal in structure. Tricuspid valve regurgitation is not demonstrated. Aortic Valve: The aortic valve is tricuspid. Aortic valve regurgitation is mild to moderate. Pulmonic Valve: The pulmonic valve was grossly normal. Pulmonic valve regurgitation is mild. No evidence of pulmonic stenosis. Aorta: Aortic dilatation noted. There is borderline dilatation of the ascending aorta, measuring 39 mm. Venous: The inferior vena cava is normal in size with greater than 50% respiratory variability, suggesting right atrial pressure of 3 mmHg. IAS/Shunts: The atrial septum is grossly normal.  LEFT VENTRICLE PLAX 2D LVIDd:         3.60 cm  Diastology LVIDs:         1.90 cm  LV e' medial:    6.20 cm/s LV PW:         0.90 cm  LV E/e' medial:  12.3 LV IVS:        1.50 cm  LV e' lateral:   7.29 cm/s LVOT diam:     1.90 cm  LV E/e' lateral: 10.5 LV SV:         100 LV SV Index:   57 LVOT Area:     2.84 cm  RIGHT VENTRICLE RV S prime:     10.60 cm/s TAPSE (M-mode): 1.8 cm LEFT ATRIUM             Index       RIGHT ATRIUM           Index LA diam:         3.40 cm 1.96 cm/m  RA Area:     13.00 cm LA Vol (A2C):   44.9 ml 25.91 ml/m RA Volume:   27.90 ml  16.10 ml/m LA Vol (A4C):   32.6 ml 18.81 ml/m LA Biplane Vol: 41.9 ml 24.18 ml/m  AORTIC VALVE LVOT Vmax:   185.00 cm/s LVOT Vmean:  131.000 cm/s LVOT VTI:    0.351 m  AORTA Ao Root diam: 3.00 cm Ao Asc diam:  3.90 cm MITRAL VALVE MV Area (PHT): 3.23 cm     SHUNTS MV Decel Time: 235 msec     Systemic VTI:  0.35 m MV E velocity: 76.30 cm/s   Systemic Diam: 1.90 cm MV A velocity: 115.00 cm/s MV E/A ratio:  0.66 Rudean Haskell MD Electronically signed by Rudean Haskell MD Signature Date/Time: 05/31/2021/1:36:07 PM    Final        Discharge Exam: Vitals:   06/01/21 2041 06/02/21 0619  BP:  135/69  Pulse:  68  Resp: (!) 23 16  Temp:  98.2 F (36.8 C)  SpO2: 99% 99%    General: Pt is alert, awake, not in acute distress, weak appearing  Cardiovascular: RRR, S1/S2 +, no edema Respiratory: CTA bilaterally, no wheezing, no rhonchi, no respiratory distress, no conversational dyspnea  Abdominal: Soft, NT, ND, bowel sounds + Extremities: no edema, no cyanosis Psych: Normal mood and affect, stable judgement and insight     The results of significant diagnostics from this hospitalization (including imaging, microbiology, ancillary and laboratory) are listed below for reference.     Microbiology: No results found for this or any previous visit (from the past 240 hour(s)).   Labs: BNP (last 3 results) No results for input(s): BNP in the last 8760 hours. Basic Metabolic Panel: Recent Labs  Lab 05/28/21 0529 05/29/21 0540 05/30/21 [id number] 05/31/21 [id number] 06/01/21 [id number] 06/02/21 [id number]  NA 144 142 139 142 139 139  K 3.5 3.5 3.7 3.9 3.9 3.9  CL 111 113* 110 110 108 109  CO2 20* 24 21* '22 23 25  '$ GLUCOSE 97 121* 130* 120* 161* 142*  BUN '11 11 10 '$ 7* 10 11  CREATININE 0.52 0.52 0.50 0.51 0.60 0.53  CALCIUM 8.9 8.2* 8.1* 8.7* 8.1* 7.6*  MG 1.7 1.6* 1.8 1.8  --   --   PHOS 3.5  --    --   --   --   --    Liver Function Tests: Recent Labs  Lab 05/28/21 0529 05/31/21 0752  AST  --  63*  ALT  --  39  ALKPHOS  --  154*  BILITOT  --  0.6  PROT  --  5.8*  ALBUMIN 2.7* 2.6*   No results for input(s): LIPASE, AMYLASE in the last 168 hours. No results for input(s): AMMONIA in the last 168 hours. CBC: Recent Labs  Lab 05/28/21 0529 05/31/21 0752  WBC 7.9 8.3  NEUTROABS  --  6.3  HGB 11.2* 11.8*  HCT 32.9* 35.5*  MCV 92.2 91.5  PLT 231 254   Cardiac Enzymes: No results for input(s): CKTOTAL, CKMB, CKMBINDEX, TROPONINI in the last 168 hours. BNP: Invalid input(s): POCBNP CBG: No results for input(s): GLUCAP in the last 168 hours. D-Dimer No results for input(s): DDIMER in the last 72 hours. Hgb A1c No results for input(s): HGBA1C in the last 72 hours. Lipid Profile No results for input(s): CHOL, HDL, LDLCALC, TRIG, CHOLHDL, LDLDIRECT in the last 72 hours. Thyroid function studies No results for input(s): TSH, T4TOTAL, T3FREE, THYROIDAB in the last 72 hours.  Invalid input(s): FREET3 Anemia work up No results for input(s): VITAMINB12, FOLATE, FERRITIN, TIBC, IRON, RETICCTPCT in the last 72 hours. Urinalysis    Component Value Date/Time   COLORURINE YELLOW 05/18/2021 2000   APPEARANCEUR CLEAR 05/18/2021 2000   LABSPEC 1.020 05/18/2021 2000   PHURINE 6.0 05/18/2021 2000   GLUCOSEU NEGATIVE 05/18/2021 2000   HGBUR MODERATE (A) 05/18/2021 2000   BILIRUBINUR NEGATIVE 05/18/2021 2000   KETONESUR NEGATIVE 05/18/2021 2000   PROTEINUR NEGATIVE 05/18/2021 2000   UROBILINOGEN 0.2 06/27/2009 0855   NITRITE NEGATIVE 05/18/2021 2000   LEUKOCYTESUR NEGATIVE 05/18/2021 2000   Sepsis Labs Invalid input(s): PROCALCITONIN,  WBC,  LACTICIDVEN Microbiology No results found for this or any previous visit (from the past 240 hour(s)).   Patient was seen and examined on the day of discharge and was found to be in stable condition. Time coordinating discharge: 35  minutes including assessment and coordination of care, as well as examination of the patient.   SIGNED:  Dessa Phi, DO [facility] 06/02/2021, 9:11 AM

## 2021-06-02 NOTE — Progress Notes (Signed)
Manufacturing engineer [facility]) Hospital Liaison: RN note     Notified by Transition of Care Manger of patient/family request for [facility] services at home after discharge. Chart and patient information under review by [facility] physician. Hospice eligibility pending currently.     Writer spoke with pt's daughter, Venida Jarvis, to initiate education related to hospice philosophy, services and team approach to care.  Sherri verbalized understanding of information given. Per discussion, plan is for discharge home today by [facility].   Please send signed and completed DNR form home with patient/family.   Please provide prescriptions for mediations at discharge to ensure comfort until patient can be admitted onto hospice services.   DME needs have been discussed.  Patient currently has the following equipment in the home: bedside commode, walker, shower chair. Patient/family requests the following DME for delivery to the home: O2 '@2L'$ .  Discussed need/desire for hospital bed, and Sherri is clear that this is not desired before discharge.  [place] case manager will reassess for need after admission onto hospice services.  [place] equipment manager has been notified and will contact DME provider to arrange delivery to the home. Home address has been verified and is correct in the chart.     Pt's son Sherren Mocha [phone]) is the family member to contact to arrange time of delivery.      [facility] aware of the above. Please notify [facility] when patient is ready to leave the unit at discharge. (Call [phone] or [phone] after 5pm.)  [facility] information and contact numbers given to  [facility].  Please do not hesitate to call with questions.   Thank you for the opportunity to participate in this patient's care.  Domenic Moras, BSN, RN       [place] (listed on AMION under [phone]  [phone] (24h on call)

## 2021-06-02 NOTE — Progress Notes (Addendum)
Pt and dgt decided to refuse all medications at this time. Pt is currently A&Ox3, disoriented to year, but is very suspicious of RN giving medication. Per dgt, we will skip today's doses and she will attempt to medicate her at home. Currently, waiting for home O2 to be delivered and pt will be discharged home with hospice via [place]. Pt resting comfortably in bed. WCTM.   [place] here to pick up patient. Pt transported via stretcher. Daughter Venida Jarvis, got all belongings and took home.

## 2021-06-03 ENCOUNTER — Other Ambulatory Visit: Payer: Self-pay | Admitting: Hematology & Oncology

## 2021-06-03 ENCOUNTER — Telehealth: Payer: Self-pay

## 2021-06-03 NOTE — Telephone Encounter (Signed)
Patients son, Ohana Birdwell called asking if he could get more info on what was going on with his mom. States he was at the hospital when Dr.Ennever first met with them on Saturday and understood she could try injections and then his sister was there yesterday when it sounded like she only has 6 to 8 weeks to live. Dr.Ennever called and spoke with patients son Sherren Mocha to discuss patient going to Hospice.

## 2021-06-10 ENCOUNTER — Telehealth: Payer: Self-pay

## 2021-06-10 ENCOUNTER — Inpatient Hospital Stay: Payer: Medicare (Managed Care) | Admitting: Internal Medicine

## 2021-06-10 ENCOUNTER — Inpatient Hospital Stay: Payer: Medicare (Managed Care)

## 2021-06-10 NOTE — Telephone Encounter (Signed)
I spoke with pts son today regarding her missed appt with dr. Julien Nordmann. He states we can cancel the appt because they have chosen to move forward with hospice and spoke to Dr. Sondra Come regarding this already.

## 2021-06-11 ENCOUNTER — Ambulatory Visit: Payer: Medicare (Managed Care) | Admitting: Nurse Practitioner

## 2021-07-03 DEATH — deceased

## 2023-07-12 IMAGING — MR MR ABDOMEN WO/W CM
19 series · 48 of 48 positions shown · IV contrast (7.0m L GADAVIST)
Comparison: Comparison made with CT of the chest, abdomen and
pelvis May 18, 2021.

CLINICAL DATA: Follow-up renal cyst/liver masses.

EXAM:
MRI ABDOMEN WITHOUT AND WITH CONTRAST
TECHNIQUE: Multiplanar multisequence MR imaging of the abdomen was performed
both before and after the administration of intravenous contrast.
CONTRAST:  7mL GADAVIST GADOBUTROL 1 MMOL/ML IV SOLN

[Series 3: T2 · coronal · 6.0mm · 1.56mm/px · 1 of 30 slices shown (1 of 2)]
[im 1/30]
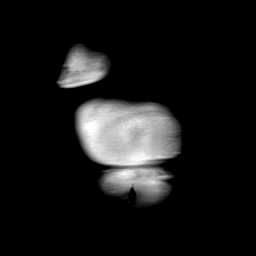

[Series 6: T2 fat-sat · axial · 6.0mm · 1.25mm/px · 1 of 36 slices shown]
[im 1/36]
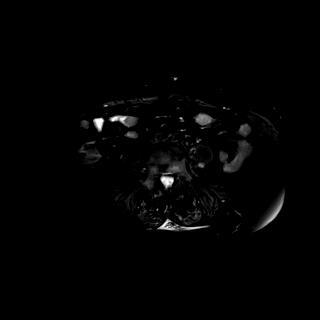

[Series 8: DWI · axial · 6.0mm · 1.49mm/px · z∈[-142,+110]mm · 3 of 72 slices shown (1 of 2)]
[im 1/72]
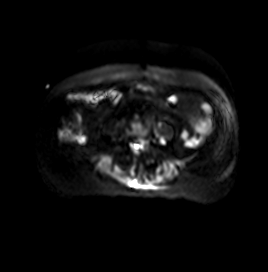
[im 36/72]
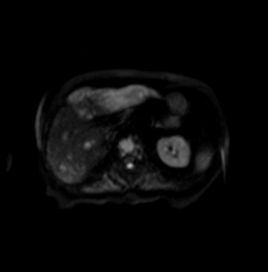
[im 72/72]
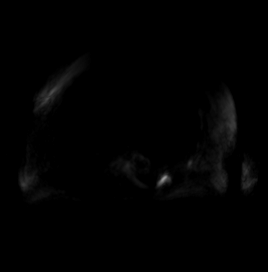

[Series 9: DWI · axial · 6.0mm · 1.49mm/px · 1 of 36 slices shown (2 of 2)]
[im 1/36]
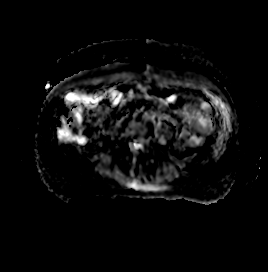

[Series 10: T1 · axial · 3.0mm · 1.25mm/px · z∈[-136,+101]mm · 3 of 80 slices shown (1 of 2)]
[im 1/80]
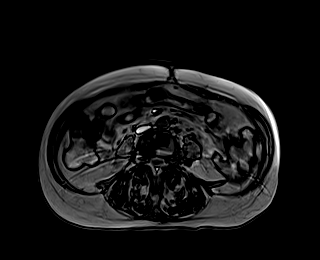
[im 40/80]
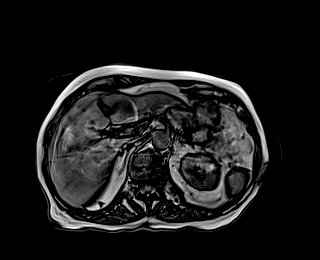
[im 80/80]
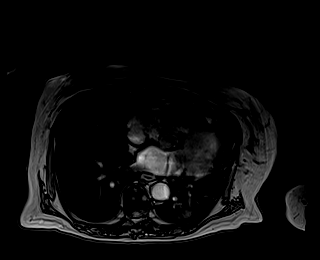

[Series 11: T1 · axial · 3.0mm · 1.25mm/px · z∈[-136,+101]mm · 3 of 80 slices shown (2 of 2)]
[im 1/80]
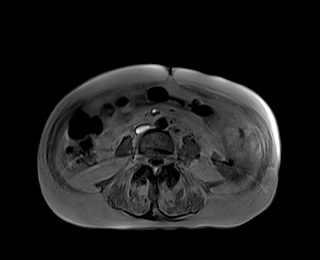
[im 40/80]
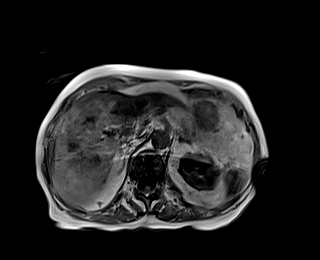
[im 80/80]
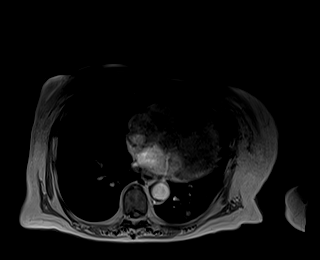

[Series 12: bSSFP · axial · 4.0mm · 0.84mm/px · z∈[-121,+99]mm · 2 of 56 slices shown]
[im 1/56]
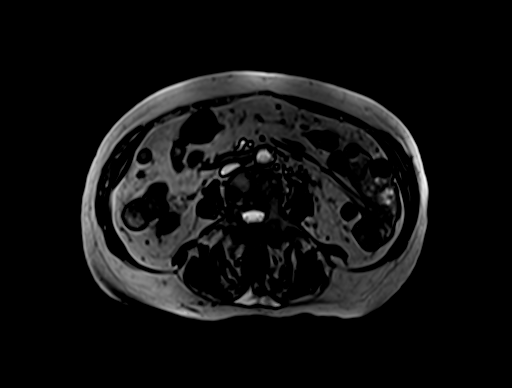
[im 56/56]
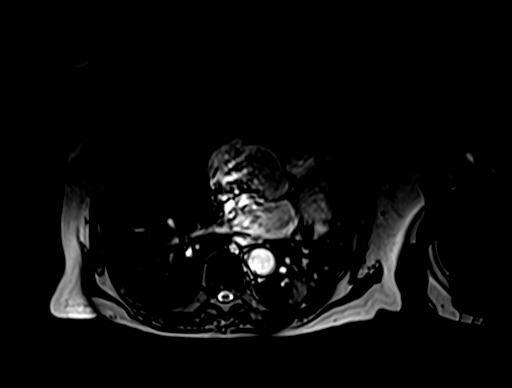

[Series 14: T1 dynamic · axial · 3.0mm · 1.25mm/px · z∈[-136,+101]mm · 3 of 80 slices shown (1 of 11)]
[im 1/80]
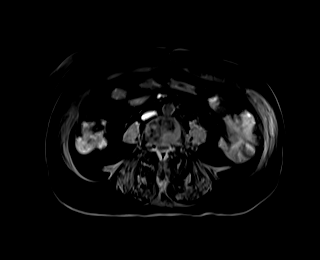
[im 40/80]
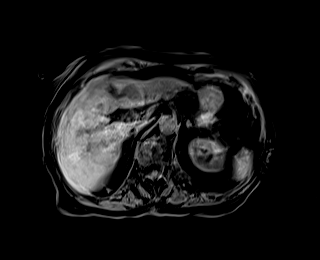
[im 80/80]
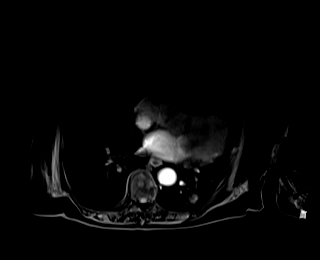

[Series 18: T1 dynamic · axial · 3.0mm · 1.25mm/px · z∈[-136,+101]mm · 3 of 80 slices shown (2 of 11)]
[im 1/80]
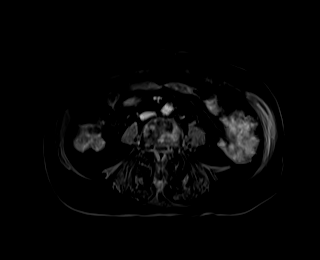
[im 40/80]
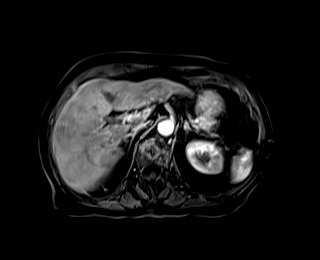
[im 80/80]
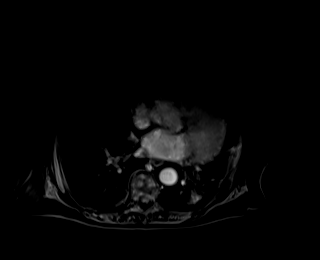

[Series 19: T1 dynamic · axial · 3.0mm · 1.25mm/px · z∈[-136,+101]mm · 3 of 80 slices shown (3 of 11)]
[im 1/80]
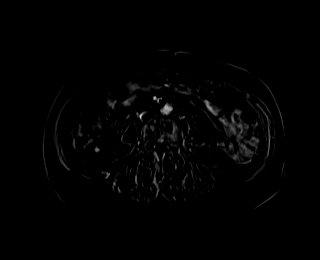
[im 40/80]
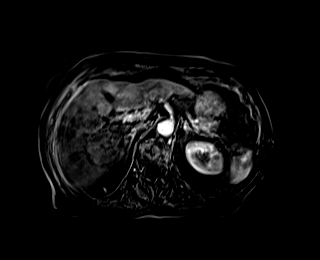
[im 80/80]
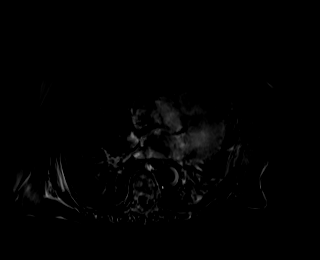

[Series 22: T1 dynamic · axial · 3.0mm · 1.25mm/px · z∈[-136,+101]mm · 3 of 80 slices shown (4 of 11)]
[im 1/80]
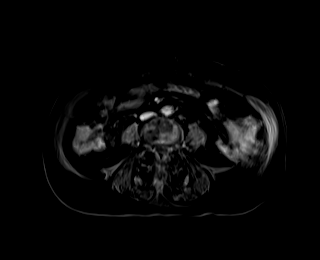
[im 40/80]
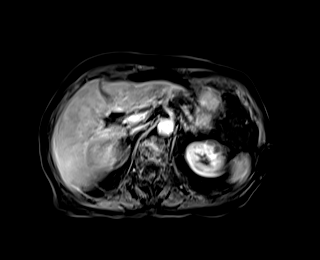
[im 80/80]
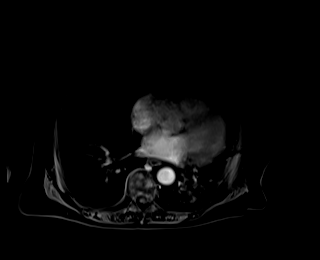

[Series 23: T1 dynamic · axial · 3.0mm · 1.25mm/px · z∈[-136,+101]mm · 3 of 80 slices shown (5 of 11)]
[im 1/80]
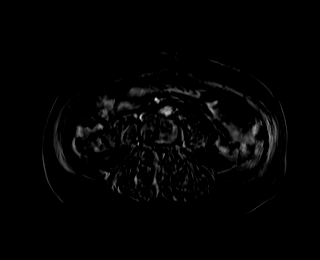
[im 40/80]
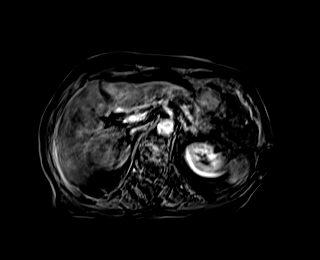
[im 80/80]
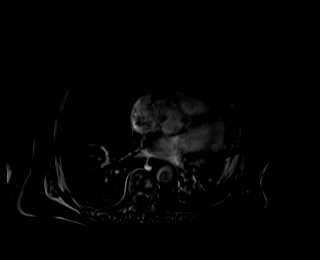

[Series 26: T1 dynamic · axial · 3.0mm · 1.25mm/px · z∈[-136,+101]mm · 3 of 80 slices shown (6 of 11)]
[im 1/80]
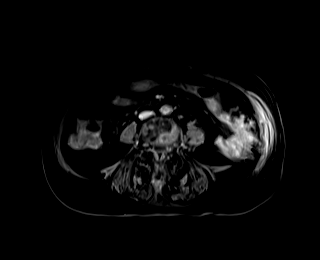
[im 40/80]
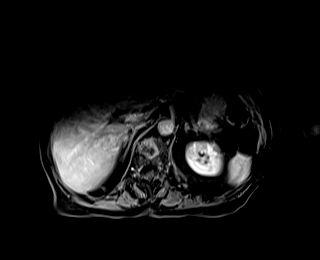
[im 80/80]
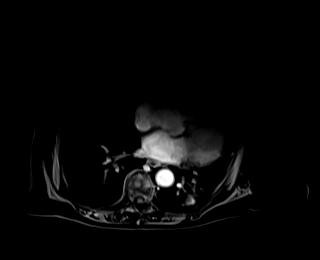

[Series 27: T1 dynamic · axial · 3.0mm · 1.25mm/px · z∈[-136,+101]mm · 3 of 80 slices shown (7 of 11)]
[im 1/80]
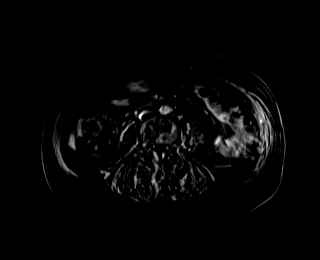
[im 40/80]
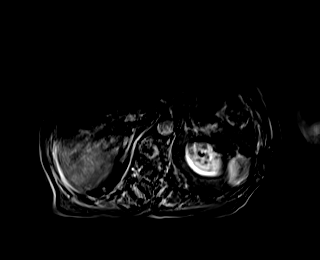
[im 80/80]
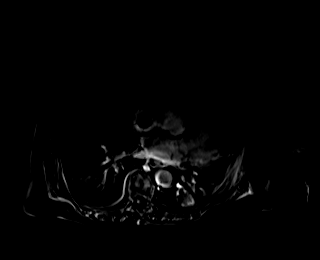

[Series 30: T2 · axial · 6.0mm · 1.56mm/px · 1 of 32 slices shown (2 of 2)]
[im 1/32]
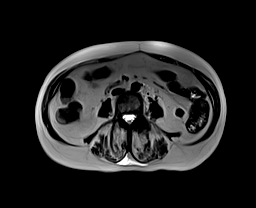

[Series 31: T1 dynamic · coronal · 3.0mm · 1.41mm/px · 3 of 72 slices shown (8 of 11)]
[im 1/72]
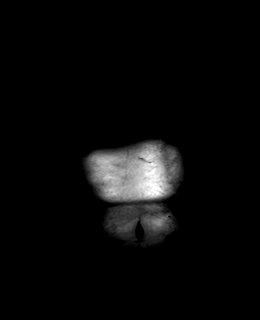
[im 36/72]
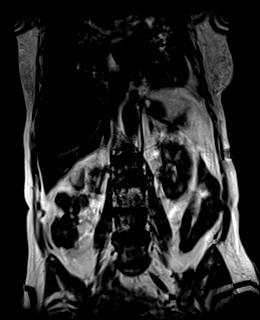
[im 72/72]
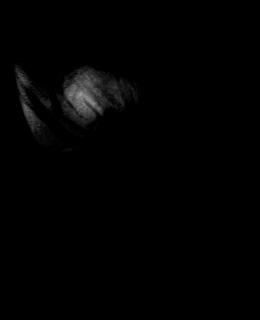

[Series 32: T1 dynamic · coronal · 3.0mm · 1.41mm/px · 3 of 72 slices shown (9 of 11)]
[im 1/72]
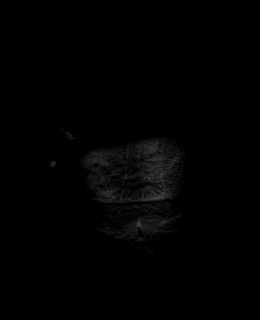
[im 36/72]
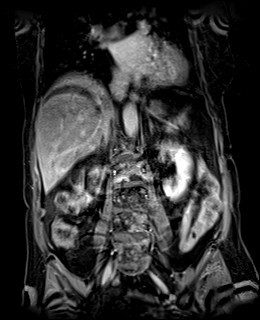
[im 72/72]
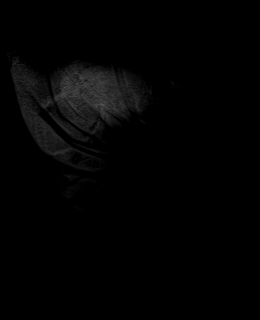

[Series 35: T1 dynamic · axial · 3.0mm · 1.25mm/px · z∈[-136,+101]mm · 3 of 80 slices shown (10 of 11)]
[im 1/80]
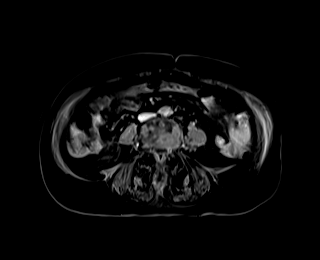
[im 40/80]
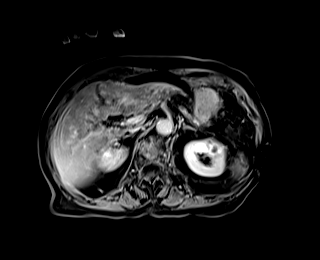
[im 80/80]
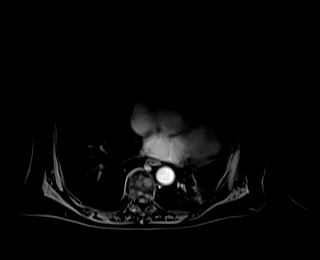

[Series 36: T1 dynamic · axial · 3.0mm · 1.25mm/px · z∈[-136,+101]mm · 3 of 80 slices shown (11 of 11)]
[im 1/80]
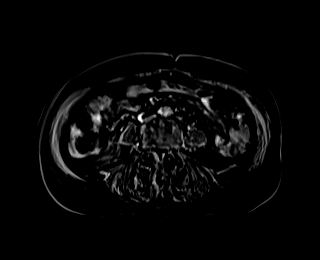
[im 40/80]
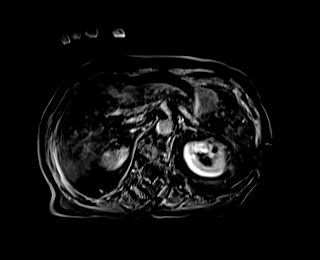
[im 80/80]
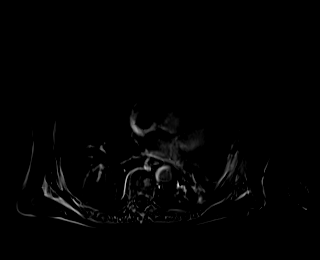

[48 of 48 positions shown; findings below may reference images not displayed]

FINDINGS: Lower chest: LEFT lower lobe nodularity not as well demonstrated as
on the recent CT of the chest, abdomen and pelvis. No effusion.

Hepatobiliary: Multiple hepatic lesions. Diffuse abnormal signal in
the LEFT hepatic lobe. Greater than 30 lesions in the liver. Most of
these in the range of cm but again with confluent disease throughout
the LEFT hepatic lobe lateral segment. These are best seen on
diffusion and T2 weighted images as there is motion artifact
associated with the postcontrast images.

(Image 56/8) 14 mm lesion in the posterior RIGHT hepatic lobe.

Multiple lesions seen on image 45 of series 8 largest approximately
a cm approximately 14-15 lesions on that single image.

No pericholecystic stranding or biliary duct dilation.

Pancreas: No signs of pancreatic inflammation or ductal dilation. No
visible lesion. Imaged compromised by motion.

Spleen:  Spleen normal size and contour.

Adrenals/Urinary Tract:  Adrenal glands are normal.

Cysts are present in the bilateral kidneys. Largest in the LEFT
posterior lower pole with thin nonenhancing septations measuring
approximately 4.5 x 3.8 x 4.4 cm. Smaller cysts elsewhere in the
bilateral kidneys.

Stomach/Bowel: No acute gastrointestinal process to the extent
evaluated on abdominal MRI.

Vascular/Lymphatic: Vascular structures without dilation. There is
no gastrohepatic or hepatoduodenal ligament lymphadenopathy. No
retroperitoneal or mesenteric lymphadenopathy.

Other:  No ascites.

Musculoskeletal: Multifocal bone lesions with all visualized levels
of the spine showing restricted diffusion nearly replacing the
marrow in multiple levels of the spine best seen on coronal images.
Also involving the visualized pelvis nearly diffuse involvement of
the iliac bones posteriorly
IMPRESSION: 1. Signs of multiple hepatic metastatic lesions with confluent
disease in the LEFT hepatic lobe and multiple smaller lesions in the
RIGHT hepatic lobe as described.
2. Diffuse bony metastatic disease throughout the spine and
visualized pelvis.
3. Pulmonary findings better seen on recent chest abdomen pelvis CT.
4. Findings that favor a Bosniak category II cyst in the lower pole
of the LEFT kidney. Given motion artifact on the current study would
suggest attention on subsequent imaging. No signs of enhancement are
seen on motion degraded images.
